# Patient Record
Sex: Female | Born: 1960 | Race: Black or African American | Hispanic: No | Marital: Married | Smoking: Never smoker
Health system: Southern US, Community
[De-identification: ages and names within clinical notes are randomized; demographics above are authoritative.]

## PROBLEM LIST (undated history)

## (undated) DIAGNOSIS — F319 Bipolar disorder, unspecified: Secondary | ICD-10-CM

## (undated) DIAGNOSIS — I10 Essential (primary) hypertension: Secondary | ICD-10-CM

## (undated) DIAGNOSIS — Z1231 Encounter for screening mammogram for malignant neoplasm of breast: Principal | ICD-10-CM

---

## 2015-08-19 NOTE — Nursing Note (Signed)
Nursing Discharge Summary - Text       Nursing Discharge Summary Entered On:  08/19/2015 21:00 EST    Performed On:  08/19/2015 20:58 EST by Homero Fellers, RN, AMANDA L               DC Information   Discharge To, Anticipated :   Home with family support   Professional Skilled Services, Anticipated :   No Needs   Mode of Discharge :   Ambulatory   Transportation :   Private vehicle   Accompanied By :   Gilford Silvius, RN, AMANDA L - 08/19/2015 20:58 EST   Education   Responsible Learner(s) :   No Data Available     Barriers To Learning :   None evident   Teaching Method :   Explanation, Printed materials   [place], RN, [place] L - 08/19/2015 20:58 EST   Post-Hospital Education Adult Grid   Diagnostic Results :   Verbalizes understanding   Disease Process :   Verbalizes understanding   Importance of Follow-Up Visits :   Verbalizes understanding   Pain Management :   Verbalizes understanding   When to Call Health Care Provider :   [facility] understanding   [place], RN, Jill Side - 08/19/2015 20:58 EST   Health Maintenance Education Adult Grid   Bathing/Hygiene :   Verbalizes understanding   [place], Charity fundraiser, Jill Side - 08/19/2015 20:58 EST   Medication Education Adult Grid   Med Dosage, Route, Scheduling :   TEFL teacher understanding   Med Generic/Brand Name, Purpose, Action :   Verbalizes understanding   Medication Precautions :   IT sales professional, Medication :   Verbalizes understanding   Homero Fellers, RN, AMANDA L - 08/19/2015 20:58 EST   Additional Learner(s) Present :   Spouse   Homero Fellers, RN, AMANDA L - 08/19/2015 20:58 EST

## 2015-08-20 NOTE — Nursing Note (Signed)
Nursing Discharge Summary - Text       Nursing Discharge Summary Entered On:  08/20/2015 23:11 EST    Performed On:  08/20/2015 23:10 EST by Adelene AmasALBENESIUS, RN, KRISTIN               DC Information   Mode of Discharge :   Wheelchair   Transportation :   Private vehicle   Accompanied By :   Evlyn CourierSpouse   ALBENESIUS, RN, KRISTIN - 08/20/2015 23:10 EST

## 2015-10-22 NOTE — Nursing Note (Signed)
Orthostatics - Text       Orthostatics Entered On:  10/22/2015 16:58 EST    Performed On:  10/22/2015 16:57 EST by Ladona Ridgel, RN, JENNIFER S               Orthostatics   Systolic/Diastolic  Supine BP :   140 mmHg   Systolic/Diastolic  Supine BP :   80 mmHg   Pulse Supine :   93 bpm   Systolic/Diastolic  Standing BP :   135 mmHg   Systolic/Diastolic  Standing BP :   86 mmHg   Pulse Standing :   98 bpm   Laruth Bouchard - 10/22/2015 16:57 EST

## 2016-04-30 NOTE — Nursing Note (Signed)
Adult Patient History Form-Text       Adult Patient History Entered On:  04/30/2016 21:30 EDT    Performed On:  04/30/2016 21:29 EDT by [facility], RN, SARAH               General Info   Preferred Name :   Lindsay Blackburn   Accompanied By :   Spouse   In Charge of News (ICON) Name :   [phone]   Primary Language :   English   Pregnancy Status :   Patient denies   [facility], [facility] - 04/30/2016 21:29 EDT   Problem History   (As Of: 04/30/2016 21:30:52 EDT)   Problems(Active)    Bipolar disorder (IMO  :[id number] )  Name of Problem:   Bipolar disorder ; Recorder:   TAYLOR, RN, JENNIFER S; Confirmation:   Confirmed ; Classification:   Medical ; Code:   39201 ; Contributor System:   DietitianowerChart ; Last Updated:   10/22/2015 16:28 EST ; Life Cycle Date:   10/22/2015 ; Life Cycle Status:   Active ; Vocabulary:   IMO        Diabetes (SNOMED CT  :[id number] )  Name of Problem:   Diabetes ; Recorder:   Delora FuelLOWDERMILK, RN, STEPHEN; Confirmation:   Confirmed ; Classification:   Medical ; Code:   [id number]:   [id number] ; Contributor System:   DietitianowerChart ; Last Updated:   08/19/2015 17:45 EST ; Life Cycle Date:   08/19/2015 ; Life Cycle Status:   Active ; Vocabulary:   SNOMED CT        Hypercholesteremia (IMO  :[id number] )  Name of Problem:   Hypercholesteremia ; Recorder:   Delora FuelLOWDERMILK, RN, STEPHEN; Confirmation:   Confirmed ; Classification:   Medical ; Code:   [phone] ; Contributor System:   PowerChart ; Last Updated:   08/19/2015 17:45 EST ; Life Cycle Date:   08/19/2015 ; Life Cycle Status:   Active ; Vocabulary:   IMO        Hypertension (SNOMED CT  :[id number] )  Name of Problem:   Hypertension ; Recorder:   Delora FuelLOWDERMILK, RN, STEPHEN; Confirmation:   Confirmed ; Classification:   Medical ; Code:   [id number] ; Contributor System:   PowerChart ; Last Updated:   08/19/2015 17:45 EST ; Life Cycle Date:   08/19/2015 ; Life Cycle Status:   Active ; Vocabulary:   SNOMED CT          Diagnoses(Active)    Benign  essential hypertension  Date:   04/30/2016 ; Diagnosis Type:   Discharge ; Confirmation:   Confirmed ; Clinical Dx:   Benign essential hypertension ; Classification:   Medical ; Clinical Service:   Non-Specified ; Code:   ICD-10-CM ; Probability:   0 ; Diagnosis Code:   I10      Chronic bipolar disorder  Date:   04/30/2016 ; Diagnosis Type:   Discharge ; Confirmation:   Confirmed ; Clinical Dx:   Chronic bipolar disorder ; Classification:   Medical ; Clinical Service:   Non-Specified ; Code:   ICD-10-CM ; Probability:   0 ; Diagnosis Code:   F31.9      CVA (cerebrovascular accident)  Date:   04/30/2016 ; Diagnosis Type:   Discharge ; Confirmation:   Confirmed ; Clinical Dx:   CVA (cerebrovascular accident) ; Classification:   Medical ; Clinical Service:   Non-Specified ; Code:   ICD-10-CM ; Probability:   0 ; Diagnosis Code:   I63.9  Diabetes  Date:   04/30/2016 ; Diagnosis Type:   Discharge ; Confirmation:   Confirmed ; Clinical Dx:   Diabetes ; Classification:   Medical ; Clinical Service:   Non-Specified ; Code:   ICD-10-CM ; Probability:   0 ; Diagnosis Code:   E11.9      Facial droop  Date:   04/30/2016 ; Diagnosis Type:   Reason For Visit ; Confirmation:   Confirmed ; Clinical Dx:   Facial droop ; Classification:   Medical ; Clinical Service:   Emergency medicine ; Code:   PNED ; Probability:   0 ; Diagnosis Code:   [phone]      Headache  Date:   04/30/2016 ; Diagnosis Type:   Reason For Visit ; Confirmation:   Complaint of ; Clinical Dx:   Headache ; Classification:   Medical ; Clinical Service:   Non-Specified ; Code:   PNED ; Probability:   0 ; Diagnosis Code:   [id number]      Hypercholesterolemia  Date:   04/30/2016 ; Diagnosis Type:   Discharge ; Confirmation:   Confirmed ; Clinical Dx:   Hypercholesterolemia ; Classification:   Medical ; Clinical Service:   Non-Specified ; Code:   ICD-10-CM ; Probability:   0 ; Diagnosis Code:   E78.00        Family  History   Family History   (As Of: 04/30/2016 21:30:52 EDT)     Allergy   (As Of: 04/30/2016 21:30:52 EDT)   Allergies (Active)   No Known Allergies  Estimated Onset Date:   Unspecified ; Created By:   Delora Fuel, RN, STEPHEN; Reaction Status:   Active ; Category:   Drug ; Substance:   No Known Allergies ; Type:   Allergy ; Updated By:   Lacy Duverney; Reviewed Date:   04/30/2016 21:30 EDT        Immunizations   Immunizations Current :   Yes   Last Tetanus :   Less than 5 years   [facility], RN, [facility] - 04/30/2016 21:29 EDT   ID Risk Screen   Patient Recent Travel History :   No recent travel   [facility], RN, [facility] - 04/30/2016 21:29 EDT   Chills :   No   Cough (Any Duration) :   No   Fever :   No   Hemoptysis (Blood in Sputum) :   No   Night Sweats :   No   RHODENBAUGH, RN, SARAH - 04/30/2016 21:29 EDT   C. diff Screen   Have you had 3 or more loose/watery stool in 24 hours? :   No   RHODENBAUGH, RN, [facility] - 04/30/2016 21:29 EDT   Procedure History        -    Procedure History   (As Of: 04/30/2016 21:30:52 EDT)     Anesthesia Minutes:   0 ; Procedure Name:   Tubal ligation ; Procedure Minutes:   0 ; Last Reviewed Dt/Tm:   04/30/2016 21:30:23 EDT            Anesthesia/Sedation   Anesthesia History :   Prior general anesthesia   [facility], RN, [facility] - 04/30/2016 21:29 EDT   Transfusion/Bloodless Med   Transfusion History :   Unknown   Will Patient Accept Blood Transfusion and/or Blood Products :   Yes   [facility], RN, [facility] - 04/30/2016 21:29 EDT   Nutrition   Home Diet :   Regular   RHODENBAUGH, RN, [facility] - 04/30/2016 21:29  EDT   Living and Resources   Living Situation :   Home with family support   [facility], [facility] - 04/30/2016 21:29 EDT   Social History   Social History   (As Of: 04/30/2016 21:30:52 EDT)   Tobacco:        Never smoker   (Last Updated: 04/30/2016 19:09:28 EDT by Harlin Heysollins, RN, Kishma A)          Alcohol:        Denies   (Last Updated: 10/22/2015 16:31:16 EST by Ladona RidgelAYLOR, RN, Richarda OsmondJENNIFER S)           Substance Abuse:        Denies   (Last Updated: 10/22/2015 16:31:20 EST by Ladona RidgelAYLOR, RN, Richarda OsmondJENNIFER S)            Advance Directive   *Advance Directive :   No   [facility]BAUGH, RN, [facility] - 04/30/2016 21:29 EDT   Educ Needs   Barriers to Learning :   Cognitive deficits   [facility] - 04/30/2016 21:29 EDT   DC Needs   Discharge To :   Home with family support   [facility], [facility] - 04/30/2016 21:29 EDT

## 2016-04-30 NOTE — Nursing Note (Signed)
Basic Admission Information - Text       Basic Admission Information Adult Entered On:  04/30/2016 21:29 EDT    Performed On:  04/30/2016 21:29 EDT by [facility], RN, SARAH               Height/Weight   Weight Measured :   97.1 kg(Converted to: 214 lb 1 oz, 214.069 lb)    Height/Length Measured :   165 cm(Converted to: 5 ft 5 in, 5.41 ft, 64.96 in)    BSA Measured :   2.04 m2   Body Mass Index Measured :   35.67 kg/m2   Ideal Body Weight Calculated :   56.909 kg   RHODENBAUGH, RN, SARAH - 04/30/2016 21:29 EDT   Safety   Environmental Safety in Place :   Adequate room lighting, Attend patient with toileting, Bed exit alarm, Bed in low position, Call device within reach   [facility] - 04/30/2016 21:29 EDT

## 2016-05-01 NOTE — Consults (Signed)
Consultation    [facility]  Derya Dettmann L. Katlin Bortner, MD  Service Date: 05/01/2016    REQUESTING PHYSICIAN:  Dr. 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   Parkinsonian features include prominent facial masking, sialorrhea,  and bradykinesia with cogwheeling.  There is no evidence of stroke by  exam or MRI brain.    RECOMMENDATIONS:  Psychiatric followup for adjustment of haloperidol  dose.  Reduced dose is recommended from a neurologic perspective.   Impression as above is discussed in detail with the patient and with  the husband at the bedside.    HISTORY OF PRESENT ILLNESS:  The patient is a 55 year old right-handed  female admitted on 04/30/2016.  History is obtained from conversation  with the patient and the husband at the bedside as well as medical  record review.  She complained of sialorrhea of 3 days duration prior  to admission.  She also noted a sense of right hand weakness and left  leg weakness/clumsiness.  She also complained of a mild to moderate  generalized headache.    She has had no recent nausea/vomiting.  She denies chest pain or  palpitations.  There is no prior history of stroke, seizures, or  cephalic trauma.    CT brain on admission concerning for possible subacute right corona  radiata lacunar infarction, but MRI brain performed earlier today  demonstrates no evidence of acute stroke or hemorrhage.  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    Presently, the patient reports that she is feeling better.  She has  not experienced drooling today.  She reports that her upper  extremities seem to be working better.  She has not been ambulating.    The patient with history of bipolar disorder followed at a local  mental health clinic.  She does not know the name of her psychiatrist.    PAST MEDICAL HISTORY:  Hypertension, diabetes mellitus,  hypercholesterolemia, bipolar disorder.    PAST SURGERIES:  Tubal ligation.    SOCIAL HISTORY:  The patient is married.  No  cigarette or alcohol use.    FAMILY HISTORY:  No family history of neurologic disease aside from a  brother with seizures.    SYSTEMS REVIEW:  Noncontributory except as noted above.    PHYSICAL EXAMINATION:  Temperature 36.4, blood pressure 135/91.  Pulse  80.  The patient is alert, calm, cooperative, and anxious.  Head is  atraumatic.  Neck supple.  Speech normally articulated with intact  comprehension.  Pupils equal and reactive.  Motility full and  conjugate without nystagmus.  Facial movements symmetric.  Prominent  facial masking present.  No sialorrhea presently.  Motor exam  demonstrates moderate bradykinesia, no tremor.  Fine motor movements  slowed of diminished amplitude, prominent cogwheeling both upper  extremities.  The patient moves all extremities actively and  symmetrically to command.  There is no drift, myoclonus, or asterixis.   Ambulation not observed.  Reflexes absent.  Plantar responses are  neutral.    DIAGNOSTIC TESTING:  MRI brain and CT brain as noted above.  Lab  studies reviewed.    Preadmission medications reviewed and include Haldol 10 mg daily,  Depakote 500 mg b.i.d., Benztropine 1 mg b.i.d.    Thank you very much for this consultation.      Karin LieuJames L Flower Franko, MD  TR: *n DD: 05/01/2016 17:33 TD: 05/01/2016 17:56 Job#: [id number]  \\[id number]\\DOC#: [id number]  \\[id number]\\\\[id number]\\      cc:  Arsenio LoaderWilliam Snyder III,  MD  Signature Line    Electronically Signed on 05/01/2016 10:37 PM EDT  ________________________________________________  Jesse SansBUMGARTNER-MD,  Lexander Tremblay L

## 2016-05-01 NOTE — Case Communication (Signed)
CM Discharge Planning Assessment - Text       CM Discharge Planning Ongoing Assessment Entered On:  05/01/2016 17:06 EDT    Performed On:  05/01/2016 16:59 EDT by Adela PortsHOLLADAY,  JANELLE L               Discharge Needs I   Previously Documented Discharge Needs :   DISCHARGE PLAN/NEEDS:  Discharge To, Anticipated: [facility] with family support [facility] 04/30/16 21:29:00  EQUIPMENT/TREATMENT NEEDS:       Previously Documented Benefits Information :   No discharge data available.     Anticipated Discharge Date :   05/02/2016 EDT   Anticipated Discharge Time Slot :   1400-1600   Discharge To :   Home with family support   Home Caregiver Name/Relationship :   Johann Capersjimmy Wolters-- spouse   Home Caregiver Phone Number :   [phone]  [phone]   CM Progress Note :   [facility]01/16[facility] patient seen at bedside.  she has returned to baseline neuro status , but is sp old cva.  she states hat she lives with her family in a one story home.  she is requesting a walker for assistance in ambulation and feels that she needs home health pt.  she states she has no dme at present .  her pcp is dr Marcy Sirencharles smith  and [facility]  she has medicare for [employer] reimbursement only.    will follow for pt eval recommendations,  but she wants to go home with home health and dme.  will follow.      Drucie OpitzHOLLADAY,  JANELLE L - 05/01/2016 16:59 EDT   Discharge Needs II   Needs Assistance with Transportation :   No   Needs Assistance at Home Upon Discharge :   Yes   Drucie OpitzHOLLADAY,  JANELLE L - 05/01/2016 16:59 EDT

## 2016-05-01 NOTE — Discharge Summary (Signed)
Discharge Summary    [facility]  Alexza Norbeck L. Katrinka BlazingSmith, MD  Admission Date: 04/30/2016  Discharged Date: 05/01/2016    DISCHARGE DIAGNOSES:  1.  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  2.  Possible transient ischemic attack.  3.  Bipolar disorder.  4.  Diabetes.  5.  Hypertension.    HOSPITAL COURSE:  Ms. Lindsay Blackburn was admitted with facial droop and  drooling that had been going on for several days prior to admission.   There is possibility of a stroke or at least TIA.  CT of the brain  initially in the Emergency Room showed possible lacunar infarct in the  corona radiata.  She was monitored overnight.  MRI of the brain did  not show evidence of that.  She was seen in consultation by Dr.  Collier BullockBumgartner who felt that she was having a parkinsonian reaction to her  haloperidol and recommended a decreased dose which was acted upon.   She was improved at the time of discharge.  Stable for followup with  me in the office in 1 week.      Orlando Pennerharles L Kyuss Hale, MD  TR: *n DD: 05/09/2016 16:05 TD: 05/10/2016 06:46 Job#: [id number]  \\[id number]\\DOC#: [id number]  \\[id number]\\\\[id number]\\  Signature Line    Electronically Signed on 05/10/2016 06:02 PM EDT  ________________________________________________  Katrinka BlazingSMITH JR-MD,  Revonda HumphreyHARLES LEON

## 2016-05-01 NOTE — Discharge Summary (Signed)
 Inpatient Patient Summary               [facility]  [address]  [place], [place] [address]  [phone]  Patient Discharge Instructions    Name: Lindsay Blackburn, Lindsay Blackburn  Current Date: 05/01/2016 17:48:39  DOB: Aug 19, 1961 MRN: [id number] FIN: [phone]  Patient Address: [address] [place] [address]  Patient Phone: [phone]  Primary Care Provider:  Name: CLAUDENE ROYCE CARLIN MERIBETH  Phone: [phone]   Immunizations Provided:        Immunization(s) Given This Visit   Name Date   tuberculin purified protein derivative 04/30/16 23:54:00          Discharge Diagnosis: Benign essential hypertension; CVA (cerebrovascular accident); Chronic bipolar disorder; Diabetes; Hypercholesterolemia  Discharged To: TO, ANTICIPATED%>Home with family support  Home Treatments: TREATMENTS, ANTICIPATED%>  Devices/Equipment: EQUIPMENT REHAB%>  Post Hospital Services: HOSPITAL SERVICES%>  Professional Skilled Services: SKILLED SERVICES%>  Therapist, sports and Community Resources: SERV AND COMM RES, ANTICIPATED%>  Mode of Discharge Transportation: TRANSPORTATION%>  Discharge Orders         Discharge Patient 05/01/16 17:30:00 EDT        Comment:     Medications   During the course of your visit, your medication list was updated with the most current information. The details of those changes are reflected below:         New Medications  [facility]), [address] [place], [place] [address], [phone]  aspirin (aspirin 81 mg oral tablet) 1 Tabs Oral (given by mouth) every day. Refills: 0.  Last Dose:____________________  aspirin (aspirin 81 mg oral tablet) 1 Tabs Oral (given by mouth) every day. Refills: 4.  Last Dose:____________________  Medications That Have Not Changed  Other Medications  amLODIPine (amLODIPine 10 mg oral tablet) 1 Tabs Oral (given by mouth) every day.  Last Dose:____________________  atorvastatin (atorvastatin 10 mg oral tablet) 1 Tabs Oral (given by mouth) every day.  Last  Dose:____________________  benztropine (benztropine 1 mg oral tablet) 1 Tabs Oral (given by mouth) 2 times a day.  Last Dose:____________________  divalproex sodium (divalproex sodium 500 mg oral tablet, extended release) 2 Tabs Oral (given by mouth) Once a Day (at bedtime).  Last Dose:____________________  famotidine (famotidine 20 mg oral tablet) 1 Tabs Oral (given by mouth) 2 times a day.  Last Dose:____________________  haloperidol (haloperidol 10 mg oral tablet) 1 Tabs Oral (given by mouth) Once a Day (at bedtime).  Last Dose:____________________  labetalol (labetalol 200 mg oral tablet) 1 Tabs Oral (given by mouth) 2 times a day.  Last Dose:____________________  metFORMIN (metFORMIN 500 mg oral tablet) 1 Tabs Oral (given by mouth) 2 times a day.  Last Dose:____________________  mupirocin topical (mupirocin 2% topical ointment) 1 Application Topical (on the skin) 3 times a day.  Last Dose:____________________  pioglitazone (pioglitazone 30 mg oral tablet) 1 Tabs Oral (given by mouth) every day.  Last Dose:____________________        [facility] would like to thank you for allowing us  to assist you with your healthcare needs. The following includes patient education materials and information regarding your injury/illness.    Schonberg, Isola L has been given the following list of follow-up instructions, prescriptions, and patient education materials:  Follow-up Instructions:             With: Address: When:   CLAUDENE ROYCE CARLIN MERIBETH [address]  [place], [place]  [address]  [phone]  In 1 week                 It is important to always keep an active list of medications available so that you can share with other providers and manage your medications appropriately. As an additional courtesy, we are also providing you with your final active medications list that you can keep with you.          amLODIPine (amLODIPine 10 mg oral tablet) 1 Tabs Oral (given by mouth) every day.  aspirin (aspirin 81 mg  oral tablet) 1 Tabs Oral (given by mouth) every day. Refills: 0.  aspirin (aspirin 81 mg oral tablet) 1 Tabs Oral (given by mouth) every day. Refills: 4.  atorvastatin (atorvastatin 10 mg oral tablet) 1 Tabs Oral (given by mouth) every day.  benztropine (benztropine 1 mg oral tablet) 1 Tabs Oral (given by mouth) 2 times a day.  divalproex sodium (divalproex sodium 500 mg oral tablet, extended release) 2 Tabs Oral (given by mouth) Once a Day (at bedtime).  famotidine (famotidine 20 mg oral tablet) 1 Tabs Oral (given by mouth) 2 times a day.  haloperidol (haloperidol 10 mg oral tablet) 1 Tabs Oral (given by mouth) Once a Day (at bedtime).  labetalol (labetalol 200 mg oral tablet) 1 Tabs Oral (given by mouth) 2 times a day.  metFORMIN (metFORMIN 500 mg oral tablet) 1 Tabs Oral (given by mouth) 2 times a day.  mupirocin topical (mupirocin 2% topical ointment) 1 Application Topical (on the skin) 3 times a day.  pioglitazone (pioglitazone 30 mg oral tablet) 1 Tabs Oral (given by mouth) every day.      Take only the medications listed above. Contact your doctor prior to taking any medications not on this list.      Discharge instructions, if any, will display below    Instructions for Diet: INSTRUCTIONS FOR DIET%>   Instructions for Supplements: SUPPLEMENT INSTRUCTIONS%>   Instructions for Activity: INSTRUCTIONS FOR ACTIVITY%>   Instructions for Wound Care: INSTRUCTIONS FOR WOUND CARE%>    Medication leaflets, if any, will display below     Patient education materials, if any, will display below        Discharge Instructions for Transient Ischemic Attack (TIA)   You have been diagnosed with a transient ischemic attack (TIA). You can think of a TIA as a temporary or mini-stroke. Blood temporarily could not reach part of your brain. Unlike a stroke, TIAs usually cause no lasting damage. If you think you are having symptoms of a TIA or stroke, get medical help right away--even if the symptoms go away.    Prevention    Take  your medications exactly as directed. Dont skip doses.    Learn to take your blood pressure. Keep a log for your doctor.    Change your diet if your doctor tells you to. Your doctor may suggest that you cut back on salt. If so, here are some tips:    Limit canned, dried, packaged, and fast foods.    Dont add salt to your food at the table.    Season foods with herbs instead of salt when you cook.    Maintain a healthy weight. Get help to lose any extra pounds.    Begin an exercise program. Ask your doctor how to get started. You can benefit from simple activities, such as walking or gardening.    Limit your alcohol intake to no more than 2 drinks a day.    Know your  cholesterol level. Follow your doctors advice about how to keep cholesterol under control.    If you are a smoker, you need to quit now. Enroll in a stop-smoking program to improve your chances of success. Ask your doctor about medications or other methods to help you quit.    Your health care provider will give you information on dietary changes that you may need to make, based on your situation. Your provider may recommend that you see a registered dietitian for help with diet changes. Changes may include:    Reducing fat and cholesterol intake    Reducing sodium (salt) intake, especially if you have high blood pressure    Increasing your intake of fresh vegetables and fruits    Eating lean proteins, such as fish, poultry, and legumes (beans and peas) and eating less red meat and processed meats    Using low-fat dairy products    Using vegetable and nut oils in limited amounts    Limiting sweets and processed foods such as chips, cookies, and baked goods    If you are overweight, your health care provider will work with you to lose weight and lower your body mass index (BMI) to a normal or near-normal level. Making diet changes and increasing physical activity can help.    Begin an exercise program. Ask your doctor how to get  started and how much activity you should try to get on a daily or weekly basis. You can benefit from simple activities such as walking or gardening.    Learn stress-management techniques to help you deal with stress in your home and work life.   Follow-up care    Some medications require blood tests to check for progress or problems. Keep follow-up appointments for any blood tests ordered by your doctors.       When to seek medical care   Call 911 right away if you have any of the following:    Weakness, tingling, or loss of feeling on one side of your face or body    Sudden double vision, or trouble seeing in one or both eyes    Sudden trouble talking, or slurring your speech    Trouble understanding others    Sudden, severe headache    Dizziness, loss of balance, or a spinning feeling, a sense of falling    Blackouts or seizures      2000-2017 [employer] [address] [place], [place] [address]. All rights reserved. This information is not intended as a substitute for professional medical care. Always follow your healthcare professional's instructions.         What is a TIA?   A TIA (Transient Ischemic Attack) is an early warning that a stroke (also called a brain attack) is coming. A TIA is a temporary stroke. It causes no lasting damage. But the effects of a stroke, if it happens, can be very serious and lasting. If you think you are having symptoms of a TIA or stroke--even if they dont last--get medical help right away.       If you have any symptoms of a TIA or stroke, call 911 and your doctor as soon as possible.    Symptoms of TIA and stroke   Symptoms may come on suddenly and last for a few seconds or a few hours. You may have symptoms only once. Or they may come and go for days. If you notice any of the following symptoms, dont wait. Call 911 or  emergency services right away.    Weakness, numbness, tingling, or loss of feeling in your face, arm, or leg.    Trouble seeing in one  or both eyes; double vision.    Slurred speech, trouble talking, or problems understanding others when they speak    Sudden, severe headache    Dizziness or a feeling of spinning    Loss of balance or falling    Blackouts      2000-2017 [employer] [address] [place], [place] [address]. All rights reserved. This information is not intended as a substitute for professional medical care. Always follow your healthcare professional's instructions.           IS IT A STROKE? Act FAST and Check for these signs:    FACE                         Does the face look uneven?    ARM                         Does one arm drift down?    SPEECH                    Does their speech sound strange?    TIME                         Call 9-1-1 at any sign of stroke  Heart Attack Signs  Chest discomfort: Most heart attacks involve discomfort in the center of the chest and lasts more than a few minutes, or goes away and comes back. It can feel like uncomfortable pressure, squeezing, fullness or pain.  Discomfort in upper body: Symptoms can include pain or discomfort in one or both arms, back, neck, jaw or stomach.  Shortness of breath: With or without discomfort.  Other signs: Breaking out in a cold sweat, nausea, or lightheaded.  Remember, MINUTES DO MATTER. If you experience any of these heart attack warning signs, call 9-1-1 to get immediate medical attention!     ---------------------------------------------------------------------------------------------------------------------  [facility] allows you to manage your health, view your test results, and retrieve your discharge documents from your hospital stay securely and conveniently from your computer.  To begin the enrollment process, visit [email or web address] Click on "Sign up now" under [facility].   Yes - Patient/Family/Caregiver demonstrates understanding of instructions given        ______________________________                                  ___________________    Patient/Family/ Caregiver Signature                                                      Date/Time     ______________________________                                 ___________________    Provider Signature  Date/Time

## 2016-05-01 NOTE — Discharge Summary (Signed)
 Inpatient Clinical Summary             [facility]  Post-Acute Care Transfer Instructions  PERSON INFORMATION   Name: Lindsay Blackburn, Lindsay Blackburn   MRN: [id number]    FIN#: [id number]   PHYSICIANS  Admitting Physician: NONIE FALLOW III  Attending Physician: NONIE FALLOW III   PCP: CLAUDENE ROYCE CARLIN MERIBETH  Discharge Diagnosis: Benign essential hypertension; CVA (cerebrovascular accident); Chronic bipolar disorder; Diabetes; Hypercholesterolemia  Comment:       PATIENT EDUCATION INFORMATION  Instructions:             Transient Ischemic Attack (TIA), Discharge Instructions; What Is a TIA?  Medication Leaflets:               Follow-up:                          With: Address: When:   CLAUDENE ROYCE CARLIN MERIBETH [address] [place]  [place], [place]  [address]  [phone] In 1 week                           MEDICATION LIST  Medication Reconciliation at Discharge:         New Medications  [facility]), [address] [place], [place] [address], [phone]  aspirin (aspirin 81 mg oral tablet) 1 Tabs Oral (given by mouth) every day. Refills: 0.  Last Dose:____________________  aspirin (aspirin 81 mg oral tablet) 1 Tabs Oral (given by mouth) every day. Refills: 4.  Last Dose:____________________  Medications That Have Not Changed  Other Medications  amLODIPine (amLODIPine 10 mg oral tablet) 1 Tabs Oral (given by mouth) every day.  Last Dose:____________________  atorvastatin (atorvastatin 10 mg oral tablet) 1 Tabs Oral (given by mouth) every day.  Last Dose:____________________  benztropine (benztropine 1 mg oral tablet) 1 Tabs Oral (given by mouth) 2 times a day.  Last Dose:____________________  divalproex sodium (divalproex sodium 500 mg oral tablet, extended release) 2 Tabs Oral (given by mouth) Once a Day (at bedtime).  Last Dose:____________________  famotidine (famotidine 20 mg oral tablet) 1 Tabs Oral (given by mouth) 2 times a day.  Last Dose:____________________  haloperidol  (haloperidol 10 mg oral tablet) 1 Tabs Oral (given by mouth) Once a Day (at bedtime).  Last Dose:____________________  labetalol (labetalol 200 mg oral tablet) 1 Tabs Oral (given by mouth) 2 times a day.  Last Dose:____________________  metFORMIN (metFORMIN 500 mg oral tablet) 1 Tabs Oral (given by mouth) 2 times a day.  Last Dose:____________________  mupirocin topical (mupirocin 2% topical ointment) 1 Application Topical (on the skin) 3 times a day.  Last Dose:____________________  pioglitazone (pioglitazone 30 mg oral tablet) 1 Tabs Oral (given by mouth) every day.  Last Dose:____________________         Patient's Final Home Medication List Upon Discharge:          amLODIPine (amLODIPine 10 mg oral tablet) 1 Tabs Oral (given by mouth) every day.  aspirin (aspirin 81 mg oral tablet) 1 Tabs Oral (given by mouth) every day. Refills: 0.  aspirin (aspirin 81 mg oral tablet) 1 Tabs Oral (given by mouth) every day. Refills: 4.  atorvastatin (atorvastatin 10 mg oral tablet) 1 Tabs Oral (given by mouth) every day.  benztropine (benztropine 1 mg oral tablet) 1 Tabs Oral (given by mouth) 2 times a day.  divalproex sodium (divalproex  sodium 500 mg oral tablet, extended release) 2 Tabs Oral (given by mouth) Once a Day (at bedtime).  famotidine (famotidine 20 mg oral tablet) 1 Tabs Oral (given by mouth) 2 times a day.  haloperidol (haloperidol 10 mg oral tablet) 1 Tabs Oral (given by mouth) Once a Day (at bedtime).  labetalol (labetalol 200 mg oral tablet) 1 Tabs Oral (given by mouth) 2 times a day.  metFORMIN (metFORMIN 500 mg oral tablet) 1 Tabs Oral (given by mouth) 2 times a day.  mupirocin topical (mupirocin 2% topical ointment) 1 Application Topical (on the skin) 3 times a day.  pioglitazone (pioglitazone 30 mg oral tablet) 1 Tabs Oral (given by mouth) every day.         Comment:       ORDERS         Order Name Order Details   Discharge Patient 05/01/16 17:30:00 EDT

## 2016-09-12 NOTE — Nursing Note (Signed)
Basic Admission Information - Text       Basic Admission Information Adult Entered On:  09/12/2016 2:05 EST    Performed On:  09/12/2016 2:03 EST by Wilford SportsBOULWARE, RN, SHARNICE R               Height/Weight   Weight Measured :   102.05 kg(Converted to: 225 lb 0 oz, 224.982 lb)    Height/Length Measured :   167 cm(Converted to: 5 ft 6 in, 5.48 ft, 65.75 in)    BSA Measured :   2.1 m2   Body Mass Index Measured :   36.59 kg/m2   Ideal Body Weight Calculated :   58.72 kg   BOULWARE, RN, SHARNICE R - 09/12/2016 2:03 EST   Allergies   (As Of: 09/12/2016 02:05:54 EST)   Allergies (Active)   No Known Allergies  Estimated Onset Date:   Unspecified ; Created By:   Delora FuelLOWDERMILK, RN, STEPHEN; Reaction Status:   Active ; Category:   Drug ; Substance:   No Known Allergies ; Type:   Allergy ; Updated By:   Lacy DuverneyLOWDERMILK, RN, STEPHEN; Reviewed Date:   09/11/2016 23:41 EST        Medication History   Medication List   (As Of: 09/12/2016 02:05:54 EST)   Normal Order    influenza virus vaccine, inactivated mdck cell derived quadrivalent Sus  :   influenza virus vaccine, inactivated mdck cell derived quadrivalent Sus ; Status:   Ordered ; Ordered As Mnemonic:   influenza virus vaccine, inactivated ; Simple Display Line:   0.5 mL, IM, Once ; Ordering Provider:   Thurston PoundsHICKMAN-MD,  CARY S; Catalog Code:   influenza virus vaccine, inactivated ; Order Dt/Tm:   09/12/2016 00:56:55          New Consult - Check Clinical Pharmacy MPTL  :   New Consult - Check Clinical Pharmacy MPTL ; Status:   Discontinued ; Ordered As Mnemonic:   New Consult - Check Clinical Pharmacy MPTL ; Simple Display Line:   1 EA, N/A, On Call ; Ordering Provider:   SYSTEM,  SYSTEM; Catalog Code:   New Consult - Check Clinical Pharmacy MP ; Order Dt/Tm:   09/12/2016 00:54:49 ; Comment:   New consult for Flu Vaccine - Order Needed. Pharmacist should review in the Clinical Pharmacy MPTL.      As soon as appropriate, pharmacy should discontinue or void this [facility]           ondansetron 2 mg/mL Inj Soln 2 mL  :   ondansetron 2 mg/mL Inj Soln 2 mL ; Status:   Completed ; Ordered As Mnemonic:   Zofran ; Simple Display Line:   4 mg, 2 mL, IV Push, Once ; Ordering Provider:   MOE-MD,  CHRISTOPHER B; Catalog Code:   ondansetron ; Order Dt/Tm:   09/12/2016 00:01:32          aspirin 81 mg Chew Tab  :   aspirin 81 mg Chew Tab ; Status:   Completed ; Ordered As Mnemonic:   aspirin ; Simple Display Line:   324 mg, 4 tabs, Chewed, Once ; Ordering Provider:   MOE-MD,  CHRISTOPHER B; Catalog Code:   aspirin ; Order Dt/Tm:   09/11/2016 23:38:41          iopamidol 76% Inj Soln 100 mL  :   iopamidol 76% Inj Soln 100 mL ; Status:   Ordered ; Ordered As Mnemonic:   Isovue-370 ;  Simple Display Line:   100 mL, IV Contrast, On Call ; Ordering Provider:   MOE-MD,  CHRISTOPHER B; Catalog Code:   iopamidol ; Order Dt/Tm:   09/11/2016 23:38:32          iopamidol 76% Inj Soln 100 mL  :   iopamidol 76% Inj Soln 100 mL ; Status:   Completed ; Ordered As Mnemonic:   Isovue-370 ; Simple Display Line:   100 mL, IV Contrast, On Call ; Ordering Provider:   MOE-MD,  CHRISTOPHER B; Catalog Code:   iopamidol ; Order Dt/Tm:   09/11/2016 23:38:31          LORazepam 2 mg/mL Inj Soln 1 mL  :   LORazepam 2 mg/mL Inj Soln 1 mL ; Status:   Completed ; Ordered As Mnemonic:   Ativan ; Simple Display Line:   1 mg, 0.5 mL, IV Push, Once ; Ordering Provider:   MOE-MD,  CHRISTOPHER B; Catalog Code:   LORazepam ; Order Dt/Tm:   09/11/2016 23:38:40            Prescription/Discharge Order    aspirin  :   aspirin ; Status:   Prescribed ; Ordered As Mnemonic:   aspirin 81 mg oral tablet ; Simple Display Line:   81 mg, 1 tabs, Oral, Daily, 90 tabs, 4 Refill(s) ; Ordering Provider:   Lahoma Rocker,  Revonda Humphrey; Catalog Code:   aspirin ; Order Dt/Tm:   05/01/2016 17:26:05          aspirin  :   aspirin ; Status:   Prescribed ; Ordered As Mnemonic:   aspirin 81 mg oral tablet ; Simple Display Line:   81 mg, 1 tabs, Oral, Daily, 90 tabs, 0 Refill(s) ;  Ordering Provider:   Lahoma Rocker,  Revonda Humphrey; Catalog Code:   aspirin ; Order Dt/Tm:   05/01/2016 17:25:52            Home Meds    amLODIPine  :   amLODIPine ; Status:   Documented ; Ordered As Mnemonic:   amLODIPine 10 mg oral tablet ; Simple Display Line:   10 mg, 1 tabs, Oral, Daily, 90 tabs, 0 Refill(s) ; Catalog Code:   amLODIPine ; Order Dt/Tm:   05/01/2016 12:35:28          divalproex sodium  :   divalproex sodium ; Status:   Documented ; Ordered As Mnemonic:   divalproex sodium 500 mg oral tablet, extended release ; Simple Display Line:   1,000 mg, 2 tabs, Oral, Once a Day (at bedtime), 0 Refill(s) ; Catalog Code:   divalproex sodium ; Order Dt/Tm:   05/01/2016 12:35:28          famotidine  :   famotidine ; Status:   Documented ; Ordered As Mnemonic:   famotidine 20 mg oral tablet ; Simple Display Line:   20 mg, 1 tabs, Oral, BID, 0 Refill(s) ; Catalog Code:   famotidine ; Order Dt/Tm:   05/01/2016 12:33:13          haloperidol  :   haloperidol ; Status:   Documented ; Ordered As Mnemonic:   haloperidol 10 mg oral tablet ; Simple Display Line:   10 mg, 1 tabs, Oral, Once a Day (at bedtime), 0 Refill(s) ; Catalog Code:   haloperidol ; Order Dt/Tm:   05/01/2016 12:35:28          metFORMIN  :   metFORMIN ; Status:   Documented ; Ordered As Mnemonic:  metFORMIN 500 mg oral tablet ; Simple Display Line:   500 mg, 1 tabs, Oral, BID, 60 tabs, 0 Refill(s) ; Catalog Code:   metFORMIN ; Order Dt/Tm:   05/01/2016 12:27:21          mupirocin topical  :   mupirocin topical ; Status:   Documented ; Ordered As Mnemonic:   mupirocin 2% topical ointment ; Simple Display Line:   1 app, Topical, TID, 22 g, 0 Refill(s) ; Ordering Provider:   Alita Chyle; Catalog Code:   mupirocin topical ; Order Dt/Tm:   05/01/2016 12:35:28          atorvastatin  :   atorvastatin ; Status:   Documented ; Ordered As Mnemonic:   atorvastatin 10 mg oral tablet ; Simple Display Line:   10 mg, 1 tabs, Oral, Daily, 0 Refill(s) ; Catalog  Code:   atorvastatin ; Order Dt/Tm:   10/22/2015 16:30:58          benztropine  :   benztropine ; Status:   Documented ; Ordered As Mnemonic:   benztropine 1 mg oral tablet ; Simple Display Line:   1 mg, 1 tabs, Oral, BID, 180 tabs, 0 Refill(s) ; Ordering Provider:   Rogelio Seen; Catalog Code:   benztropine ; Order Dt/Tm:   10/22/2015 16:30:58          labetalol  :   labetalol ; Status:   Documented ; Ordered As Mnemonic:   labetalol 200 mg oral tablet ; Simple Display Line:   200 mg, 1 tabs, Oral, BID, 180 tabs, 0 Refill(s) ; Catalog Code:   labetalol ; Order Dt/Tm:   10/22/2015 16:30:58          pioglitazone  :   pioglitazone ; Status:   Documented ; Ordered As Mnemonic:   pioglitazone 30 mg oral tablet ; Simple Display Line:   30 mg, 1 tabs, Oral, Daily, 30 tabs, 0 Refill(s) ; Catalog Code:   pioglitazone ; Order Dt/Tm:   10/22/2015 16:30:58            Vital Signs   Temperature Oral :   36.6 degC   Peripheral Pulse Rate :   22 bpm (LOW)    Respiratory Rate :   22 br/min (HI)    Systolic/ Diastolic BP :   145 mmHg (HI)    Diastolic Blood Pressure :   75 mmHg   SpO2 :   100 %   O2 Therapy :   Room air   BOULWARE, RN, SHARNICE R - 09/12/2016 2:03 EST   Safety   Environmental Safety in Place :   Bed in low position, Wheels locked, Call device within reach, Personal items within reach, Bed exit alarm, Patient specific safety measures   Demonstrates Ability to Use Call Light :   Yes   Patient Identified :   Identification band, Photo identification   BOULWARE, RN, SHARNICE R - 09/12/2016 2:03 EST

## 2016-09-12 NOTE — Nursing Note (Signed)
 Adult Clinical Swallowing - Text       Adult Clinical Swallowing Entered On:  09/12/2016 9:17 EST    Performed On:  09/12/2016 9:16 EST by BARI, SLP, NANCY J               Reason for Treatment   *Reason for Referral :   stroke protocol evaluation     *Chief Complaint :   pt offers no specific complaints     Subjective Statement :   Pt was seen this am for evaluation .Pt was up and seated in recliner. Alert. Respirations appeared to be effortful but pt did not appear in distress. Unclear what prior communication status was. Pt with prior CVA per history. No new neuro event demonstrated on CT scan. She does have a history of bipolar disorder.     DURHAM, SLP, NANCY J - 09/12/2016 9:19 EST   General Information   Speech Orders :   SLP Eval and Treat Acute - 09/12/16 2:49:00 EST, Stop date 09/12/16 2:49:00 EST     Precautions RTF :    Notify Provider Laboratory Results, 09/12/16 2:54:00 EST, Call MD for BG greater than 350 x 2 or greater than 400 x 1 on any schedule, Ordered   Notify Provider, 09/12/16 2:49:00 EST, Failed nursing swallow  screen, 09/12/16 2:49:00 EST, 09/12/16 2:49:00 EST, Ordered   Notify Provider, 09/12/16 2:49:00 EST, Notify physician for any increased deficits., 09/12/16 2:49:00 EST, 09/12/16 2:49:00 EST, Ordered   Notify Provider Laboratory Results, 09/12/16 2:49:00 EST, Blood glucose less than 70 or greater than 200 mg/dL (x 2 consecutive measurements), Ordered   Notify Provider Vital Signs, 09/12/16 2:49:00 EST, SBP greater than 220 or less than 110, Ordered   Notify Provider Vital Signs, 09/12/16 2:49:00 EST, DBP greater than 120, Ordered   Notify Rapid Response Team, 09/12/16 2:49:00 EST, For concerns regarding patient condition & notify MD, 09/12/16 2:49:00 EST, 09/12/16 2:49:00 EST, Ordered   Change attending to, 09/12/16 0:33:00 EST, HICKMAN-MD,  CARY S, Ordered   ED Only Oxygen Therapy, 09/11/16 23:13:00 EST, Stop date 09/11/16 23:13:00 EST, Keep Sat > 92%, Completed   Notify Provider, 09/11/16  23:13:00 EST, Neurology/Telestroke Eval if Hot Stroke, 09/11/16 23:13:00 EST, 09/11/16 23:13:00 EST, Once, Completed     History of Swallow Studies :   No qualifying data available     Level of Consciousness :   Alert   Affect/Behavior :   Flat, Other: delayed verbal output   Basic Command Following :   Impaired   Orientation Assessment :   Other: OR to person, place and grossly to time with visual cues   DURHAM, SLP, NANCY J - 09/12/2016 9:19 EST   Problem List   (As Of: 09/12/2016 09:32:40 EST)   Problems(Active)    At risk for infection (SNOMED CT  :786229980 )  Name of Problem:   At risk for infection ; Recorder:   SYSTEM,  SYSTEM; Confirmation:   Confirmed ; Classification:   Interdisciplinary ; Code:   [id number] ; Last Updated:   05/01/2016 12:05 EDT ; Life Cycle Date:   05/01/2016 ; Life Cycle Status:   Active ; Vocabulary:   SNOMED CT   ; Comments:        05/01/2016 12:05 - SYSTEM,  SYSTEM  Problem added automatically by system based on initiation of Risk For Infection Plan of Care      At risk for injury (SNOMED CT  :786228984 )  Name of Problem:   At risk  for injury ; Recorder:   SYSTEM,  SYSTEM; Confirmation:   Confirmed ; Classification:   Interdisciplinary ; Code:   [id number] ; Last Updated:   05/01/2016 6:13 EDT ; Life Cycle Date:   05/01/2016 ; Life Cycle Status:   Active ; Vocabulary:   SNOMED CT   ; Comments:        05/01/2016 6:13 - SYSTEM,  SYSTEM  Problem added automatically by system based on initiation of Risk for Injury Plan of Care      At risk of pressure ulcer (SNOMED CT  :7182591985 )  Name of Problem:   At risk of pressure ulcer ; Recorder:   SYSTEM,  SYSTEM; Confirmation:   Confirmed ; Classification:   Nursing ; Code:   7182591985 ; Last Updated:   05/01/2016 12:05 EDT ; Life Cycle Date:   05/01/2016 ; Life Cycle Status:   Active ; Vocabulary:   SNOMED CT        Bipolar disorder (IMO  :60798 )  Name of Problem:   Bipolar disorder ; Recorder:   TAYLOR, RN, JENNIFER S; Confirmation:   Confirmed ;  Classification:   Medical ; Code:   [phone] ; Contributor System:   PowerChart ; Last Updated:   09/12/2016 9:05 EST ; Life Cycle Date:   09/12/2016 ; Life Cycle Status:   Active ; Vocabulary:   IMO        Diabetes (SNOMED CT  :[id number] )  Name of Problem:   Diabetes ; Recorder:   DOROTEO, RN, STEPHEN; Confirmation:   Confirmed ; Classification:   Medical ; Code:   [id number] ; Contributor System:   PowerChart ; Last Updated:   09/12/2016 9:05 EST ; Life Cycle Date:   09/12/2016 ; Life Cycle Status:   Active ; Vocabulary:   SNOMED CT        Hypercholesteremia (IMO  :53324 )  Name of Problem:   Hypercholesteremia ; Recorder:   DOROTEO, RN, STEPHEN; Confirmation:   Confirmed ; Classification:   Medical ; Code:   [phone] ; Contributor System:   PowerChart ; Last Updated:   08/19/2015 17:45 EST ; Life Cycle Date:   08/19/2015 ; Life Cycle Status:   Active ; Vocabulary:   IMO        Hypertension (SNOMED CT  :35823988 )  Name of Problem:   Hypertension ; Recorder:   DOROTEO, RN, STEPHEN; Confirmation:   Confirmed ; Classification:   Medical ; Code:   35823988 ; Contributor System:   PowerChart ; Last Updated:   09/12/2016 9:05 EST ; Life Cycle Date:   09/12/2016 ; Life Cycle Status:   Active ; Vocabulary:   SNOMED CT        Impaired tissue integrity (SNOMED CT  :18596984 )  Name of Problem:   Impaired tissue integrity ; Recorder:   SYSTEM,  SYSTEM; Confirmation:   Confirmed ; Classification:   Interdisciplinary ; Code:   18596984 ; Last Updated:   05/01/2016 12:05 EDT ; Life Cycle Date:   05/01/2016 ; Life Cycle Status:   Active ; Vocabulary:   SNOMED CT   ; Comments:        05/01/2016 12:05 - SYSTEM,  SYSTEM  Problem added automatically by system based on initiation of Impaired Tissue Integrity Plan of Care      Impaired verbal communication (SNOMED CT  :46534988 )  Name of Problem:   Impaired verbal communication ; Recorder:   SYSTEM,  SYSTEM; Confirmation:   Confirmed ;  Classification:   Interdisciplinary ; Code:   46534988 ; Last Updated:   05/01/2016 6:13 EDT ; Life Cycle Date:   05/01/2016 ; Life Cycle Status:   Active ; Vocabulary:   SNOMED CT   ; Comments:        05/01/2016 6:13 - SYSTEM,  SYSTEM  Problem added automatically by system based on initiation of Impaired Communication Plan of Care      Knowledge deficit (SNOMED CT  :8768626985 )  Name of Problem:   Knowledge deficit ; Recorder:   SYSTEM,  SYSTEM; Confirmation:   Confirmed ; Classification:   Interdisciplinary ; Code:   [id number] ; Last Updated:   05/01/2016 12:05 EDT ; Life Cycle Date:   05/01/2016 ; Life Cycle Status:   Active ; Vocabulary:   SNOMED CT   ; Comments:        05/01/2016 12:05 - SYSTEM,  SYSTEM  Problem added automatically by system based on initiation of Knowledge Deficit Plan of Care        Diagnoses(Active)    Anxiety  Date:   09/12/2016 ; Diagnosis Type:   Discharge ; Confirmation:   Confirmed ; Clinical Dx:   Anxiety ; Classification:   Medical ; Clinical Service:   Non-Specified ; Code:   ICD-10-CM ; Probability:   0 ; Diagnosis Code:   F41.9      Bipolar disorder  Date:   09/12/2016 ; Diagnosis Type:   Discharge ; Confirmation:   Confirmed ; Clinical Dx:   Bipolar disorder ; Classification:   Medical ; Code:   ICD-10-CM ; Probability:   0 ; Diagnosis Code:   F31.9      Diabetes  Date:   09/12/2016 ; Diagnosis Type:   Discharge ; Confirmation:   Confirmed ; Clinical Dx:   Diabetes ; Classification:   Medical ; Code:   ICD-10-CM ; Probability:   0 ; Diagnosis Code:   E13.9      Difficulty speaking  Date:   09/11/2016 ; Diagnosis Type:   Reason For Visit ; Confirmation:   Confirmed ; Clinical Dx:   Difficulty speaking ; Classification:   Medical ; Clinical Service:   Emergency medicine ; Code:   PNED ; Probability:   0 ; Diagnosis Code:   [id number]      Dyspnea  Date:   09/12/2016 ; Diagnosis Type:   Discharge ; Confirmation:   Confirmed ; Clinical Dx:   Dyspnea ; Classification:    Medical ; Code:   ICD-10-CM ; Probability:   0 ; Diagnosis Code:   R06.00      Hypertension  Date:   09/12/2016 ; Diagnosis Type:   Discharge ; Confirmation:   Confirmed ; Clinical Dx:   Hypertension ; Classification:   Medical ; Code:   ICD-10-CM ; Probability:   0 ; Diagnosis Code:   I10      Metabolic acidosis  Date:   09/12/2016 ; Diagnosis Type:   Discharge ; Confirmation:   Confirmed ; Clinical Dx:   Metabolic acidosis ; Classification:   Medical ; Code:   ICD-10-CM ; Probability:   0 ; Diagnosis Code:   E87.2      Transient ischemic attack (TIA)  Date:   09/12/2016 ; Diagnosis Type:   Discharge ; Confirmation:   Confirmed ; Clinical Dx:   Transient ischemic attack (TIA) ; Classification:   Medical ; Clinical Service:   Non-Specified ; Code:   ICD-10-CM ; Probability:   0 ; Diagnosis Code:  G45.9        Pre-Assessment   Diet Type :   Regular Diet - 09/12/16 2:49:00 EST, Cardiac  Consistent Carb, Na: 2 gm, Constant Indicator     Home Diet Consistency :   Regular   Current Method of Nutrition :   Oral intake   Tracheostomy Information :   No qualifying data available.       DURHAM, SLP, NANCY J - 09/12/2016 9:19 EST   Oral-Facial Exam   Facial Movement :   Within functional limits   Dentition :   Own teeth   DURHAM, SLP, NANCY J - 09/12/2016 9:19 EST   Oral Structure Grid   Labial Structure :   Within functional limits   Mandible :   Within functional limits   Lingual Structure :   Within functional limits   Hard Palate :   Within functional limits   Soft Palate :   Within functional limits   DURHAM, SLP, NANCY J - 09/12/2016 9:19 EST   Oral Function Grid   Labial :   Within functional limits   Mandible :   Within functional limits   Lingual :   Within functional limits   Velopharyngeal :   Within functional limits   DURHAM, SLP, NANCY J - 09/12/2016 9:19 EST   Volitional Cough :   Within functional limits   Secretion Management :   Adequate   DURHAM, SLP, NANCY J - 09/12/2016 9:19 EST   Upper Body Control Grid   Head Control  :   Upright centered position   Trunk Control :   Upright centered position   [place], SLP, NANCY J - 09/12/2016 9:19 EST   Swallowing Exam   SLP Swallowing Details :   Pt seen at bedside. Feeding self. Dyspnea while eating and while attempting to talk without eating. Did not appeared distressed by this. Pt with timely oral prep and transit with regular textures and thin liquids. Timely swallow and no overt s/s aspiration noted. Vocal quality clear throughout    Limited speech eval completed at this time as pt was preoccupied with eating. Pt able to answer basic y/n questions and was able to provide some biographical information when requested. However, as complexity of topic increased, pt demonstrated poor verbal organization and very slow response time. It is difficult to determine if this is altered from pts prior functioning level. Pt has history of a prior stroke and some atrophy on CT scan. Will attempt to determine what prior functional status was before hospitalization and further assess speech and language as indicated.     BARI, SLP, NANCY J - 09/12/2016 9:33 EST   Assessment/Recommendations   Clinical Swallowing Impression Grid   Within Functional Limits :   Liquids, Solids   DURHAM, SLP, NANCY J - 09/12/2016 9:33 EST   Diet Consistency :   Regular   Liquid Viscosity :   Thin   Medications :   Whole with drink   Positioning :   Upright 90 degrees   Clinical Swallowing Recommendations :   Other: ST to follow re: speech and language   DURHAM, SLP, NANCY J - 09/12/2016 9:33 EST   Long Term Goals   Swallow Goals Grid     SLP Long Term Goal #1          Goal :    Pt will improve expression of basic to mod level needs with min cue to 90% acc  DURHAM, SLP, NANCY J - September 29, 2016 9:33 EST         Short Term Goals   Expressive Communication STG Grid     Goal #1          Activity :    Completeness, Conversation, Topic initiation, Topic maintenance              Context :    Sentence level              Percentage  Accuracy :    80 %              Assist :    Minimal to moderate verbal cues              Status :    Initial                DURHAM, SLP, NANCY J - 29-Sep-2016 9:33 EST         Time Spent With Patient   SLP Time In :   9:00 EST   SLP Time Out :   9:15 EST   SLP Dysphagia Evaluation :   15 Minutes   SLP Dysphagia Eval Time :   15 minutes   SLP Total Individual Therapy Time :   15 minutes   SLP Total Timed Code Treatment Units :   1 units   SLP Total Tx Time :   15 minutes   DURHAM, SLP, NANCY J - 2016/09/29 9:33 EST   SLP G-Codes and Modifiers   G-Code Required :   Yes   Primary Functional Limitation Reported :   Swallow   Reason for G-Code :   One Time Visit / Evaluation   Status Selection Method :   Used a single functional tool   SLP G-Code Assessment Comment :   ASHA noms   Swallow Current Status (H-1003) :   CH 0% impaired   Swallow Goal Status (G-8997) :   CH 0% impaired   Swallow Discharge Status (G-8998) :   CH 0% impaired   SLP Required Sections - Swallow :   Current Status, Goal Status, and Discharge Status   BARI, SLP, INOCENTE PARAS - 09/29/16 9:33 EST   Plan   SLP Evaluation Date :   September 29, 2016 9:30 EST   Planned Treatments :   Aphasia therapy   Plan/Goals Established With Patient/Caregiver :   Yes   Speech Clinical Assessment Summary :   Pt with functional swallowing skills noted. Pt with altered verbal expression noted but unclear of baseline status. Will attempt to determine baseline communication status and proceed with further speech evaluation as indicated.     Discussed eval with Nursing staff     SLP Evaluation Complete :   Yes   DURHAM, SLP, NANCY J - 09-29-2016 9:33 EST

## 2016-09-12 NOTE — Case Communication (Signed)
CM Discharge Planning Assessment - Text       CM Discharge Planning Assessment Entered On:  09/12/2016 8:43 EST    Performed On:  09/12/2016 8:38 EST by Chester HolsteinVARNER,  SARAH C               Home Environment   Living Environment :   No Living Environment Information Available     Affect/Behavior :   Appropriate   Lives With :   Spouse   Lives In :   Single level home   Living Situation :   Home with family support   Job Responsibilities :   Disability   Outside Facility Information :   PCP Dr Marcy Sirenharles Smith  [facility]   Chester HolsteinVARNER,  SARAH C - 09/12/2016 8:38 EST   Home Environment   ADLs :   Independent   Karolee StampsVARNER,  SARAH C - 09/12/2016 8:38 EST   Discharge Needs I   Previously Documented Discharge Needs :   DISCHARGE PLAN/NEEDS:  EQUIPMENT/TREATMENT NEEDS:       Previously Documented Benefits Information :   No discharge data available.     Discharge To :   Home with family support   Home Caregiver Name/Relationship :   Johann Capersjimmy Fross-- spouse   Home Caregiver Phone Number :   [phone]   CM Progress Note :   09/13/15 SCV: Pt was admitted with possible CVA.  Pt lives with her husband in one story home. Pt is on disability. Pt has had a previous stroke.  PT/OT/Speech ordered.  CM proposed PPD.  CM to follow up with further discharge needs. Pt has [employer].      Karolee StampsVARNER,  SARAH C - 09/12/2016 8:38 EST   Discharge Needs II   Professional Skilled Services :   Occupational Therapy, Physical Therapy, Speech/Language Pathology   Needs Assistance with Transportation :   No   Needs Assistance at Home Upon Discharge :   Yes   Discharge Planning Time Spent :   20 minutes   Karolee StampsVARNER,  SARAH C - 09/12/2016 8:38 EST   Benefits   Insurance Information :   Managed Medicare   Karolee StampsVARNER,  SARAH C - 09/12/2016 8:38 EST   Advance Directive   *Advance Directive :   No   Patient Wishes to Receive Further Information on Advance Directives :   No   Karolee StampsVARNER,  SARAH C - 09/12/2016 8:38 EST

## 2016-09-12 NOTE — Progress Notes (Signed)
Inpatient OT Evaluation - Text       Inpatient OT Evaluation Entered On:  09/12/2016 9:57 EST    Performed On:  09/12/2016 8:16 EST by RENGERING, OT, ERICA I               Reason for Treatment   Subjective Statement :   RN & pt agreeable to tx. Pt left sitting in recliner with LEs elevated, heels floating TABS engaged and all needs in reach.      *Reason for Referral :   Pt adm with suspected TIA. MD stating potential psych involvement. Awaiting full work up per MD.      Gerlean Ren, OT, ERICA I - 09/12/2016 9:50 EST   General Information   Occupational Therapy Orders :   OT Evaluation and Treatment Inpatient Acute - 09/12/16 2:49:00 EST, Stop date 09/12/16 2:49:00 EST     Precautions RTF :   No qualifying data available.     Pain Present :   No actual or suspected pain   RENGERING, OT, ERICA I - 09/12/2016 9:50 EST   Home Environment   Living Environment :   Home Environment  Lives In:  Single level home  Performed By:  Chester Holstein C 09/12/2016  Lives With:  Spouse  Performed By:  Karolee Stamps 09/12/2016  Living Situation:  Home with family support  Performed By:  Karolee Stamps 09/12/2016     Living Situation :   Home with family support   Lives With :   Spouse   Lives In :   Single level home   [place], [place], ERICA I - 09/12/2016 9:50 EST   Home Environment II   Living Environment :   No Living Environment Information Available     RENGERING, OT, ERICA I - 09/12/2016 9:50 EST   Prior Functional Status Grid   ADL :   Assist needed   Instrumental ADL :   Dependent   RENGERING, OT, ERICA I - 09/12/2016 9:50 EST   OT Basic ADL   Basic ADL Grid   Eating :   Supervision or setup   (Comment: anticipate based on functional observation [RENGERING, OT, ERICA I - 09/12/2016 9:50 EST] )   Grooming :   Supervision or setup   (Comment: anticipate based on functional observation Gerlean Ren, OT, ERICA I - 09/12/2016 9:50 EST] )   Bathing :   Moderate assistance   (Comment: anticipate based on functional observation [RENGERING, OT, ERICA I  - 09/12/2016 9:50 EST] )   UE Dressing :   Minimal contact assistance   (Comment: anticipate based on functional observation [RENGERING, OT, ERICA I - 09/12/2016 9:50 EST] )   LE Dressing :   Maximal assistance   (Comment: anticipate based on functional observation [RENGERING, OT, ERICA I - 09/12/2016 9:50 EST] )   Toileting :   Maximal assistance   (Comment: anticipate based on functional observation Gerlean Ren, OT, ERICA I - 09/12/2016 9:50 EST] )   Transfer Toilet :   Maximal assistance   (Comment: anticipate based on functional observation Gerlean Ren, OT, ERICA I - 09/12/2016 9:50 EST] )   RENGERING, OT, ERICA I - 09/12/2016 9:50 EST   ADL Comments :   Based on functioanl observaiton. Pt supine to sit with min A x 2. Pt requiring significant extra time to wash face. Pt requring max A to don socks. Pt sit to stand to RW with gait belt and min A x 2. Pt completed  room mobility to door with min A x 2. Pt stand to sit with max A for UE placement.      RENGERING, OT, ERICA I - 09/12/2016 9:50 EST   Cognition   Attention Cognition Grid   Sustained :   Impaired   RENGERING, OT, ERICA I - 09/12/2016 9:50 EST   Memory Cognition Grid   Retrieval :   Impaired   RENGERING, OT, ERICA I - 09/12/2016 9:50 EST   Executive Function Cognition Grid   Initiation :   Impaired   Problem Solving Skills :   Impaired   RENGERING, OT, ERICA I - 09/12/2016 9:50 EST   Standing Balance   Static Standing Balance   Stands with Two UE Support :   Rehab Minimal assistance   RENGERING, OT, ERICA I - 09/12/2016 9:50 EST   UE Strength/ROM   Upper Extremity Overall ROM Grid   Left Upper Extremity Active Range :   Impaired   Right Upper Extremity Active Range :   Impaired   RENGERING, OT, ERICA I - 09/12/2016 9:50 EST   Document Left UE Range Description :   Yes   Document Right UE Range Description :   Yes   Lt Upper Extremity Strength :   Other: unable to fully test due to pt lethargy and decreased attention at this time, will continue to assess   RENGERING, OT, ERICA I  - 09/12/2016 9:50 EST   Left Upper Extremity Strength Grid   Scapular Elevation :   2+   Shoulder Abduction :   2+   Shoulder External Rotation :   2+   Elbow Flexion :   3+   Elbow Extension :   3+   Forearm Pronation :   3+   Forearm Supination :   3+   Wrist Flexion :   3+   Wrist Extension :   3+   Finger Flexion :   4   Finger Extension :   4   RENGERING, OT, ERICA I - 09/12/2016 9:50 EST   Right Upper Extremity Strength Grid   Scapular Elevation :   2+   Shoulder Abduction :   2+   Shoulder External Rotation :   2+   Elbow Flexion :   3+   Elbow Extension :   3+   Forearm Pronation :   3+   Forearm Supination :   3+   Wrist Flexion :   3+   Wrist Extension :   3+   Finger Flexion :   3+   Finger Extension :   3+   RENGERING, OT, ERICA I - 09/12/2016 9:50 EST   Left UE ROM   Left Upper Extremity Passive Range   Shoulder Flexion 0-180 :   Within functional limits   Shoulder Extension 0-45 :   Within functional limits   Shoulder Abduction 0-180 :   Within functional limits   Shoulder External Rotation 0-90 :   Within functional limits   Shoulder Internal Rotation :   Within functional limits   Elbow Flexion 0-145 :   Within functional limits   Elbow Extension :   Within functional limits   Forearm Pronation 0-90 :   Within functional limits   Forearm Supination 0-90 :   Within functional limits   Wrist Flexion 0-80 :   Within functional limits   Wrist Extension 0-70 :   Within functional limits   MP Flexion 0-90 :  Within functional limits   MP Extension 0-45 :   Within functional limits   Thumb Flexion 0-50 :   Within functional limits   Thumb Extension 0-20 :   Within functional limits   RENGERING, OT, ERICA I - 09/12/2016 9:50 EST   Left Upper Extremity Active Range   Shoulder Flexion 0-180 :   Moderately limited   Shoulder Extension 0-45 :   Within functional limits   Shoulder Abduction 0-180 :   Moderately limited   Shoulder External Rotation 0-90 :   Moderately limited   Shoulder Internal Rotation 0-90 :   Within  functional limits   Elbow Flexion 0-145 :   Within functional limits   Elbow Extension 0-0 :   Within functional limits   Forearm Pronation 0-90 :   Within functional limits   Forearm Supination 0-90 :   Within functional limits   Wrist Flexion 0-180 :   Within functional limits   Wrist Extension 0-70 :   Within functional limits   MP Flexion 0-90 :   Within functional limits   MP Extension 0-45 :   Within functional limits   Thumb Flexion 0-50 :   Within functional limits   Thumb Extension 0-20 :   Within functional limits   RENGERING, OT, ERICA I - 09/12/2016 9:50 EST   Right UE ROM   Right Upper Extremity Passive ROM Grid   Shoulder Flexion 0-180 :   Within functional limits   Shoulder Extension 0-45 :   Within functional limits   Shoulder Abduction 0-180 :   Within functional limits   Shoulder External Rotation 0-90 :   Within functional limits   Shoulder Internal Rotation 0-90 :   Within functional limits   Elbow Flexion 0-145 :   Within functional limits   Elbow Extension 0-0 :   Within functional limits   Forearm Pronation 0-90 :   Within functional limits   Forearm Supination 0-90 :   Within functional limits   Wrist Flexion 0-80 :   Within functional limits   Wrist Extension 0-70 :   Within functional limits   MP Flexion 0-90 :   Within functional limits   MP Extension 0-45 :   Within functional limits   Thumb Flexion 0-50 :   Within functional limits   Thumb Extension 0-20 :   Within functional limits   RENGERING, OT, ERICA I - 09/12/2016 9:50 EST   Right Upper Extremity Active ROM Grid   Shoulder Flexion 0-180 :   Moderately limited   Shoulder Extension 0-45 :   Within functional limits   Shoulder Abduction 0-180 :   Moderately limited   Shoulder External Rotation 0-90 :   Moderately limited   Shoulder Internal Rotation 0-90 :   Within functional limits   Elbow Flexion 0-145 :   Within functional limits   Elbow Extension 0-0 :   Within functional limits   Forearm Pronation 0-90 :   Within functional limits    Forearm Supination 0-90 :   Within functional limits   Wrist Flexion 0-80 :   Within functional limits   Wrist Extension 0-70 :   Within functional limits   MP Flexion 0-90 :   Within functional limits   MP Extension 0-45 :   Within functional limits   Thumb Flexion 0-50 :   Within functional limits   Thumb Extension 0-20 :   Within functional limits   RENGERING, OT, ERICA I - 09/12/2016 9:50  EST   UE Sensation   Impact of Impaired UE Sensation :   unable to follow multistep commands   RENGERING, OT, ERICA I - 09/12/2016 9:50 EST   UE Coordination   Left :   18.92   Right :   17.38   RENGERING, OT, ERICA I - 09/12/2016 9:50 EST   Left Upper Extremity Coordination Grid   Finger to Nose :   Impaired   Finger Opposition :   Impaired   RENGERING, OT, ERICA I - 09/12/2016 9:50 EST   Right Upper Extremty Coordination Grid   Finger to Nose :   Impaired   Finger Opposition :   Impaired   RENGERING, OT, ERICA I - 09/12/2016 9:50 EST   Long Term Goals   Patient/Caregiver Goals :   return to highest level of indep   OT LT Goals Reviewed :   Yes   RENGERING, OT, ERICA I - 09/12/2016 9:50 EST   Short Term Goals   Grooming Goal Grid     Goal #1          Activity :    Grooming routine              Descriptors :    Unsupported short sit              Assist :    Setup                RENGERING, OT, ERICA I - 09/12/2016 9:50 EST         Upper Body Dressing Short Term Goal Grid     Goal #1          Activity :    Upper extremity dressing              Assist :    Setup                RENGERING, OT, ERICA I - 09/12/2016 9:50 EST         Lower Body Dressing Grid     Goal #1          Activity :    Lower extremity dressing              Assist :    Moderate assistance                RENGERING, OT, ERICA I - 09/12/2016 9:50 EST         Bathing Goal Grid     Goal #1          Descriptors :    Sponge bath              Assist :    Minimal assistance                RENGERING, OT, ERICA I - 09/12/2016 9:50 EST         OT ST Goals Reviewed :   Yes   RENGERING, OT,  ERICA I - 09/12/2016 9:50 EST   Plan   OT Evaluation Date :   09/12/2016 EST   OT Frequency Acute :   Mo/Tu/We/Th/Fr   Duration :   3    Duration Unit :   Weeks   Planned Treatments :   Balance training, Basic Activities of Daily Living, Cognitive training, Coordination, Energy conservation training, Equipment training, Mobility training, Patient education, Safety education, Therapeutic activities, Therapeutic exercises, Therapeutic exercises for strengthening and ROM, Visual and perceptual training  Treatment Plan/Goals Established With Patient/Caregiver :   Yes   OT Evaluation Complete :   Yes   RENGERING, OT, ERICA I - 09-29-2016 9:50 EST   Time Spent With Patient   OT Time In :   8:16 EST   OT Time Out :   8:39 EST   OT Evaluation Units, Moderate Complexity :   1 Unit   OT Individual Eval Time, Moderate Complexity :   15 minutes   OT FUNCTIONAL TRNG 15 MIN :   1    OT Functional Activities Minutes :   8 minutes   OT Total Individual Therapy Time :   23 minutes   OT Total Timed Code Treatment Units :   1 units   OT Total Timed Code Treatment Minutes :   8 minutes   OT Total Untimed Code Treatment Minutes :   15 minutes   OT Total Treatment Time Rehab :   23 minutes   RENGERING, OT, ERICA I - 09-29-2016 9:50 EST   Assessment   OT Impairments or Limitations :   Balance deficits, Basic activity of daily living deficits, Cognitive deficits, Coordination deficits, Endurance deficits, Equipment training, Mobility deficits, Proprioception deficits, Range of motion deficits, Safety awareness deficits, Strength deficits   Barriers to Safe Discharge OT :   Safety awareness, Severity of deficits   OT Discharge Recommendations :   recommend subacute rehab      OT Treatment Recommendations :   Pt presents with decreased shoulder ROM, initation, problem solving and requiring significant extra time to complete tasks. Pt would benefit from IP OT services to address above noted deficits.      RENGERING, OT, ERICA I - 09-29-16 9:50 EST    OT G-Codes and Modifiers   G-Code Required :   Yes   Primary Functional Limitation Reported :   Self-Care   Reason for G-Code :   Initial assessment   Status Selection Method :   Used clinical judgment   Self-Care Current Status (Y-3016) :   CK At least 40% but less than 60% impaired   Self-Care Goal Status (W-1093) :   CJ At least 20% but less than 40% impaired   OT Required Sections - Self Care :   Current Status and Goal Status   RENGERING, OT, ERICA I - 09-29-16 9:50 EST

## 2016-09-12 NOTE — Nursing Note (Signed)
Adult Patient History Form-Text       Adult Patient History Entered On:  09/12/2016 2:11 EST    Performed On:  09/12/2016 2:06 EST by Wilford Sports, RN, SHARNICE R               General Info   Preferred Name :   Lindsay Blackburn   Accompanied By :   Spouse   In Charge of News (ICON) Name :   [name] (Spouse)    Demonstrates Signs, Symptoms of the Following Condition :   Stroke   Information Given By :   Self   Primary Language :   English   Pregnancy Status :   Patient denies   Has the patient received chemotherapy or biotherapy within the last 48 hours? :   No   Is the patient currently (2-3 days) receiving radiation treatment? :   No   BOULWARE, RN, SHARNICE R - 09/12/2016 2:06 EST   Problem History   (As Of: 09/12/2016 02:11:19 EST)   Problems(Active)    At risk for infection (SNOMED CT  :629528413 )  Name of Problem:   At risk for infection ; Recorder:   SYSTEM,  SYSTEM; Confirmation:   Confirmed ; Classification:   Interdisciplinary ; Code:   [id number] ; Last Updated:   05/01/2016 12:05 EDT ; Life Cycle Date:   05/01/2016 ; Life Cycle Status:   Active ; Vocabulary:   SNOMED CT   ; Comments:        05/01/2016 12:05 - SYSTEM,  SYSTEM  Problem added automatically by system based on initiation of Risk For Infection Plan of Care      At risk for injury (SNOMED CT  :536644034 )  Name of Problem:   At risk for injury ; Recorder:   SYSTEM,  SYSTEM; Confirmation:   Confirmed ; Classification:   Interdisciplinary ; Code:   [id number] ; Last Updated:   05/01/2016 6:13 EDT ; Life Cycle Date:   05/01/2016 ; Life Cycle Status:   Active ; Vocabulary:   SNOMED CT   ; Comments:        05/01/2016 6:13 - SYSTEM,  SYSTEM  Problem added automatically by system based on initiation of Risk for Injury Plan of Care      At risk of pressure ulcer (SNOMED CT  :7564332951 )  Name of Problem:   At risk of pressure ulcer ; Recorder:   SYSTEM,  SYSTEM; Confirmation:   Confirmed ; Classification:   Nursing ; Code:   [id number] ; Last Updated:   05/01/2016 12:05 EDT ;  Life Cycle Date:   05/01/2016 ; Life Cycle Status:   Active ; Vocabulary:   SNOMED CT        Bipolar disorder (IMO  :16010 )  Name of Problem:   Bipolar disorder ; Recorder:   TAYLOR, RN, JENNIFER S; Confirmation:   Confirmed ; Classification:   Medical ; Code:   [phone] ; Contributor System:   Dietitian ; Last Updated:   10/22/2015 16:28 EST ; Life Cycle Date:   10/22/2015 ; Life Cycle Status:   Active ; Vocabulary:   IMO        Diabetes (SNOMED CT  :[id number] )  Name of Problem:   Diabetes ; Recorder:   Delora Fuel, RN, STEPHEN; Confirmation:   Confirmed ; Classification:   Medical ; Code:   [id number] ; Contributor System:   Dietitian ; Last Updated:   08/19/2015 17:45 EST ; Life Cycle Date:   08/19/2015 ;  Life Cycle Status:   Active ; Vocabulary:   SNOMED CT        Hypercholesteremia (IMO  :16109 )  Name of Problem:   Hypercholesteremia ; Recorder:   Delora Fuel, RN, STEPHEN; Confirmation:   Confirmed ; Classification:   Medical ; Code:   [phone] ; Contributor System:   PowerChart ; Last Updated:   08/19/2015 17:45 EST ; Life Cycle Date:   08/19/2015 ; Life Cycle Status:   Active ; Vocabulary:   IMO        Hypertension (SNOMED CT  :09811914 )  Name of Problem:   Hypertension ; Recorder:   Delora Fuel, RN, STEPHEN; Confirmation:   Confirmed ; Classification:   Medical ; Code:   78295621 ; Contributor System:   PowerChart ; Last Updated:   08/19/2015 17:45 EST ; Life Cycle Date:   08/19/2015 ; Life Cycle Status:   Active ; Vocabulary:   SNOMED CT        Impaired tissue integrity (SNOMED CT  :30865784 )  Name of Problem:   Impaired tissue integrity ; Recorder:   SYSTEM,  SYSTEM; Confirmation:   Confirmed ; Classification:   Interdisciplinary ; Code:   69629528 ; Last Updated:   05/01/2016 12:05 EDT ; Life Cycle Date:   05/01/2016 ; Life Cycle Status:   Active ; Vocabulary:   SNOMED CT   ; Comments:        05/01/2016 12:05 - SYSTEM,  SYSTEM  Problem added automatically by system  based on initiation of Impaired Tissue Integrity Plan of Care      Impaired verbal communication (SNOMED CT  :41324401 )  Name of Problem:   Impaired verbal communication ; Recorder:   SYSTEM,  SYSTEM; Confirmation:   Confirmed ; Classification:   Interdisciplinary ; Code:   02725366 ; Last Updated:   05/01/2016 6:13 EDT ; Life Cycle Date:   05/01/2016 ; Life Cycle Status:   Active ; Vocabulary:   SNOMED CT   ; Comments:        05/01/2016 6:13 - SYSTEM,  SYSTEM  Problem added automatically by system based on initiation of Impaired Communication Plan of Care      Knowledge deficit (SNOMED CT  :4403474259 )  Name of Problem:   Knowledge deficit ; Recorder:   SYSTEM,  SYSTEM; Confirmation:   Confirmed ; Classification:   Interdisciplinary ; Code:   [id number] ; Last Updated:   05/01/2016 12:05 EDT ; Life Cycle Date:   05/01/2016 ; Life Cycle Status:   Active ; Vocabulary:   SNOMED CT   ; Comments:        05/01/2016 12:05 - SYSTEM,  SYSTEM  Problem added automatically by system based on initiation of Knowledge Deficit Plan of Care        Diagnoses(Active)    Anxiety  Date:   09/12/2016 ; Diagnosis Type:   Discharge ; Confirmation:   Confirmed ; Clinical Dx:   Anxiety ; Classification:   Medical ; Clinical Service:   Non-Specified ; Code:   ICD-10-CM ; Probability:   0 ; Diagnosis Code:   F41.9      Difficulty speaking  Date:   09/11/2016 ; Diagnosis Type:   Reason For Visit ; Confirmation:   Confirmed ; Clinical Dx:   Difficulty speaking ; Classification:   Medical ; Clinical Service:   Emergency medicine ; Code:   PNED ; Probability:   0 ; Diagnosis Code:   [id number]      Potential stroke  Date:   09/11/2016 ; Diagnosis Type:   Reason For Visit ; Confirmation:   Complaint of ; Clinical Dx:   Potential stroke ; Classification:   Medical ; Clinical Service:   Non-Specified ; Code:   PNED ; Probability:   0 ; Diagnosis Code:   [id number]      Transient ischemic attack (TIA)  Date:    09/12/2016 ; Diagnosis Type:   Discharge ; Confirmation:   Confirmed ; Clinical Dx:   Transient ischemic attack (TIA) ; Classification:   Medical ; Clinical Service:   Non-Specified ; Code:   ICD-10-CM ; Probability:   0 ; Diagnosis Code:   G45.9        Family History   Family History   (As Of: 09/12/2016 02:11:20 EST)     Immunizations   Influenza Vaccine Status :   Patient refused   Last Tetanus :   Less than 5 years   BOULWARE, RN, SHARNICE R - 09/12/2016 2:06 EST   ID Risk Screen   Patient Recent Travel History :   No recent travel   Family Member Travel History :   No recent travel   Coker, RN, SHARNICE R - 09/12/2016 2:06 EST   Chills :   No   Cough (Any Duration) :   No   Fever :   No   Hemoptysis (Blood in Sputum) :   No   Night Sweats :   No   BOULWARE, RN, SHARNICE R - 09/12/2016 2:06 EST   C. diff Screen   Have you had 3 or more loose/watery stool in 24 hours? :   No   BOULWARE, RN, SHARNICE R - 09/12/2016 2:06 EST   Procedure History        -    Procedure History   (As Of: 09/12/2016 02:11:20 EST)     Anesthesia Minutes:   0 ; Procedure Name:   Tubal ligation ; Procedure Minutes:   0            Anesthesia/Sedation   Anesthesia History :   Prior general anesthesia   Anesthesia Reaction :   None   Moderate Sedation History :   Prior sedation for procedure   Wilford Sports, RN, SHARNICE R - 09/12/2016 2:06 EST   Transfusion/Bloodless Med   Transfusion History :   Unknown   Will Patient Accept Blood Transfusion and/or Blood Products :   Yes   BOULWARE, RN, SHARNICE R - 09/12/2016 2:06 EST   Nutrition   Home Diet :   Regular   Appetite :   Poor   BOULWARE, RN, SHARNICE R - 09/12/2016 2:06 EST   Functional   ADLs :   Minimal assist   BOULWARE, RN, SHARNICE R - 09/12/2016 2:06 EST   Functional Assessment   Bathing :   Requires assistance (1)   Dressing :   Requires assistance (1)   Toileting :   Requires assistance (1)   Transferring Bed or Chair :   Dependent (0)   Continence :   Requires assistance (1)   Feeding :   Independent with  assistive device (2)   ADL Index Score :   6    BOULWARE, RN, SHARNICE R - 09/12/2016 2:06 EST   Living and Resources   Living Situation :   Home with family support   Wilford Sports RN, SHARNICE R - 09/12/2016 2:06 EST   Social History   Social History   (As Of: 09/12/2016 02:11:20 EST)  Tobacco:        Never smoker   (Last Updated: 09/12/2016 00:53:41 EST by Pryor MontesGeiman, RN, Vance GatherShelley)          Alcohol:        Denies   (Last Updated: 09/12/2016 00:53:45 EST by Pryor MontesGeiman, RN, Vance GatherShelley)          Substance Abuse:        Denies   (Last Updated: 09/12/2016 00:53:48 EST by Pryor MontesGeiman, RN, Vance GatherShelley)            Spiritual   Do You Receive Comfort From Spiritual Practices :   Yes   Faith/Denomination :   [facility]   Hospital Chaplain to Visit :   Yes   Wilford SportsBOULWARE, RN, SHARNICE R - 09/12/2016 2:06 EST   Advance Directive   *Advance Directive :   No   Wilford SportsBOULWARE, RN, SHARNICE R - 09/12/2016 2:06 EST   Educ Needs   Barriers to Learning :   Cognitive deficits   Wilford SportsBOULWARE, RN, SHARNICE R - 09/12/2016 2:06 EST   DC Needs   Home Caregiver Name/Relationship :   Johann Capersjimmy Pabst-- spouse   Home Caregiver Phone Number :   [phone]  [phone]   Wilford SportsBOULWARE, RN, SHARNICE R - 09/12/2016 2:06 EST

## 2016-09-12 NOTE — Consults (Signed)
Consultation                 [facility]  Ned ClinesW. Toby Latrisa Hellums, MD  Service Date: 09/12/2016    HISTORY OF PRESENT ILLNESS:  Ms. Lindsay Blackburn is a 56 year old individual  seen at the request of her floor nurse who stopped me in the hallway  and asked me if I were a pulmonary doctor because Dr. Inda MerlinHickman had told  her that the patient might need a pulmonary consultation because of an  irregular respiratory rate and pattern.  I saw the patient in  consultation and reviewed her record; she is a very poor historian.   This tracks through the entire record.  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  She does not appear to be on lithium.   She came to the Emergency Room today because of the possibility of  having had a stroke, although her workup so far is equivocal about  this.  She has been to the Emergency Room on several occasions for  tachypnea and a sense of air hunger and breathlessness with negative  workups.  She is not recognized in the record to have any kind of lung  disease, although she is overweight.  She has a number of other  medical conditions, which have made her a quite ill person at an early  age.  She has diabetes, hypertension, a lipid disorder and bipolar  disorder that has required psychiatric institutionalization and  hospitalization for treatment.  Her complaints of shortness of breath  track through the record for quite a while and she has never been  found to have any pulmonary reason for it.  A lactate level was 2.2 in  the Emergency Department.  She has never had really any oxygen  saturation abnormalities and in fact, the nurse points out that her O2  saturation has been 100% the entire time she has been here.  A chest  x-ray is reviewed and shows low lung volumes consistent with her  obesity.     PAST MEDICAL HISTORY:  1.  Diabetes.  2.  Hypertension.  3.  Bipolar disorder.  4.  Lipid disorder.    5.  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    SOCIAL HISTORY:  Negative for tobacco or alcohol use.    ALLERGIES:  She has no known allergies.    ADMITTING MEDICATIONS:  Include amlodipine 10 mg a day, aspirin 81 mg  a day, atorvastatin 10 mg a day, benztropine 1 mg b.i.d., Haldol 2 mg  once a day, divalproex sodium 500 mg 2 tablets a.m., metformin 500 mg  b.i.d. and pioglitazone 30 mg 1 p.o. daily.  She is also on labetalol  200 mg p.o. b.i.d.    PHYSICAL EXAMINATION:  Reveals blood pressure is quite adequate at  120/70, O2 saturation 100%, weight 102 kg.  She is afebrile.  HEENT:   Shows a thick neck and a sleep apnea compatible airway.  She has a  blank stare on her face.  She answers questions poorly.  She has lip  smacking.  She has intermittent tachypnea and hyperpnea with pauses  that suggests the presence of a background Cheyne-Stokes breathing  pattern.  Her lungs do not show any wheezes, rales or rhonchi.  There  is no cardiovascular abnormality such as atrial fibrillation or  murmur.  An echocardiogram was done today and was unrevealing.  Her  abdomen is not surgical or distended.  She is obese and overweight.    LABORATORY DATA:  Reveals a chloride of 102, a CO2 of 18, creatinine  1.4, a calcium of 10.2.  A white count of 12,700.  A potassium of 4.3.   She has an anion gap of 16; this is upper limit of normal.    Chest radiograph reviewed, low lung volumes and mild cardiomegaly.  CT  angiogram of neck reveals no large vessel occlusion, severe stenosis  of the proximal left P2 segment, moderately severe stenosis of the  inferior right M2 branch, no hemodynamically significant stenosis of  carotid or vertebral arteries.  There is a 40% left carotid artery  stenosis.    ASSESSMENT:  1.  Mild (nonlactate accumulation) metabolic acidosis (versus chronic  respiratory alkalosis)with mild elevation in blood sugar.  Alternatively, this   could be a depressedCO2level on BMP from chronic  hyperventilation.    This could be a chronic  respiratory alkalosis and not a metabolic acidosis.  With the normal  lactate, it is unlikely metformin-associated acidosis.  She does not  appear to have features of sepsis or infection.  She does not appear  to have underlying lung disease.  The breathing pattern is probably  related to her psychiatric disorder, her psychiatric medication, or  Cheyne-Stokes periodic breathing pattern associated with her brain  disease.  2.  Obesity.  3.  Risk factors for obstructive sleep apnea but I did not observe her  to have this during the encounter, during which she did fall asleep  briefly.    PLAN:  1.  Oxygen therapy is not necessary.  2.  Attention to her psychiatric and metabolic details.  3.  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  Her  chronic respiratory alkalosis could be secondary to this.  No therapy  is necessary at this time.  Nursing staff counseled.      Ned Clines, MD  TR: *n DD: 09/12/2016 16:41 TD: 09/12/2016 17:28 Job#: [id number]  \\[id number]#: [id number]  \\[id number]\\      cc:  Lynann Beaver MD       Marcy Siren     Signature Line     Electronically Signed on 09/12/2016 10:12 PM EST   ________________________________________________   Keary Hanak-MD, Eugene Garnet               Modified by: Marlane Mingle on 09/12/2016 10:12 PM EST

## 2016-09-12 NOTE — Transfer Summaries (Signed)
Intrahospital Transfer - Text       Intrahospital Transfer Entered On:  09/12/2016 1:38 EST    Performed On:  09/12/2016 1:37 EST by Pryor Montes, RN, Vance Gather               Transfer   Transfer Mode :   Stretcher   Nurse Receiving Report :   Letta Kocher    Date/Time Nurse Received Report :   09/12/2016 1:20 EST   Patient's Transfer Condition :   Stable   Transfer Notification :   Spouse   Accompanied By Staff :   Nurse   (Comment: On Monitor. Pryor Montes RN, Vance Gather - 09/12/2016 1:37 EST] )   Pryor Montes RN, Vance Gather - 09/12/2016 1:37 EST

## 2016-09-13 NOTE — Progress Notes (Signed)
Inpatient PT Examination - Text       Inpatient PT Examination Entered On:  09/13/2016 8:51 EST    Performed On:  09/12/2016 8:44 EST by Montez Morita, PT, [place] G               Reason for Treatment   Subjective Statement :   pt/RN agreeable to PT evaluation.  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     *Reason for Referral :   adm for possible CVA.    PT eval/tx     CARTER, PT, [place] G - 09/13/2016 8:44 EST   General Info   Physical Therapy Orders :   PT Evaluation and Treatment Acute - 09/12/16 2:49:00 EST, Stop date 09/12/16 2:49:00 EST     Precautions RTF :    Notify Provider Laboratory Results, 09/12/16 2:54:00 EST, Call MD for BG greater than 350 x 2 or greater than 400 x 1 on any schedule, Ordered   Notify Provider, 09/12/16 2:49:00 EST, Notify physician for any increased deficits., 09/12/16 2:49:00 EST, 09/12/16 2:49:00 EST, Ordered   Notify Provider, 09/12/16 2:49:00 EST, Failed nursing swallow  screen, 09/12/16 2:49:00 EST, 09/12/16 2:49:00 EST, Ordered   Notify Provider Laboratory Results, 09/12/16 2:49:00 EST, Blood glucose less than 70 or greater than 200 mg/dL (x 2 consecutive measurements), Ordered   Notify Provider Vital Signs, 09/12/16 2:49:00 EST, SBP greater than 220 or less than 110, Ordered   Notify Provider Vital Signs, 09/12/16 2:49:00 EST, DBP greater than 120, Ordered   Notify Rapid Response Team, 09/12/16 2:49:00 EST, For concerns regarding patient condition & notify MD, 09/12/16 2:49:00 EST, 09/12/16 2:49:00 EST, Ordered   Change attending to, 09/12/16 0:33:00 EST, HICKMAN-MD,  CARY S, Ordered     Orientation Assessment :   Not oriented to situation, Not oriented to time   Affect/Behavior :   Appropriate, Other: extremely drowsy in the AM and difficulty waking up.   Basic Command Following :   Intact   Safety/Judgment :   Impaired   Pain Present :   No actual or suspected pain   CARTER, PT, [place] G - 09/13/2016 8:44 EST    Problem List   (As Of: 09/13/2016 08:51:05 EST)   Problems(Active)    At risk for falls (SNOMED CT  :161096045 )  Name of Problem:   At risk for falls ; Onset Date:   09/12/2016 ; Recorder:   SYSTEM,  SYSTEM; Confirmation:   Confirmed ; Classification:   Interdisciplinary ; Code:   [id number] ; Last Updated:   09/12/2016 17:13 EST ; Life Cycle Date:   09/12/2016 ; Life Cycle Status:   Active ; Vocabulary:   SNOMED CT   ; Comments:        09/12/2016 17:13 - SYSTEM,  SYSTEM  Problem added by Discern Expert      At risk for infection (SNOMED CT  :782956213 )  Name of Problem:   At risk for infection ; Recorder:   SYSTEM,  SYSTEM; Confirmation:   Confirmed ; Classification:   Interdisciplinary ; Code:   086578469 ; Last Updated:   05/01/2016 12:05 EDT ; Life Cycle Date:   05/01/2016 ; Life Cycle Status:   Active ; Vocabulary:   SNOMED CT   ; Comments:        05/01/2016 12:05 - SYSTEM,  SYSTEM  Problem added automatically by system based on  initiation of Risk For Infection Plan of Care      At risk for injury (SNOMED CT  :161096045 )  Name of Problem:   At risk for injury ; Recorder:   SYSTEM,  SYSTEM; Confirmation:   Confirmed ; Classification:   Interdisciplinary ; Code:   409811914 ; Last Updated:   05/01/2016 6:13 EDT ; Life Cycle Date:   05/01/2016 ; Life Cycle Status:   Active ; Vocabulary:   SNOMED CT   ; Comments:        05/01/2016 6:13 - SYSTEM,  SYSTEM  Problem added automatically by system based on initiation of Risk for Injury Plan of Care      At risk of pressure ulcer (SNOMED CT  :7829562130 )  Name of Problem:   At risk of pressure ulcer ; Recorder:   SYSTEM,  SYSTEM; Confirmation:   Confirmed ; Classification:   Nursing ; Code:   [id number] ; Last Updated:   05/01/2016 12:05 EDT ; Life Cycle Date:   05/01/2016 ; Life Cycle Status:   Active ; Vocabulary:   SNOMED CT        Bipolar disorder (IMO  :95284 )  Name of Problem:   Bipolar disorder ; Recorder:   TAYLOR, RN, JENNIFER S; Confirmation:   Confirmed ; Classification:    Medical ; Code:   [phone] ; Contributor System:   Dietitian ; Last Updated:   09/12/2016 9:05 EST ; Life Cycle Date:   09/12/2016 ; Life Cycle Status:   Active ; Vocabulary:   IMO        Diabetes (SNOMED CT  :[id number] )  Name of Problem:   Diabetes ; Recorder:   Delora Fuel, RN, STEPHEN; Confirmation:   Confirmed ; Classification:   Medical ; Code:   [id number] ; Contributor System:   Dietitian ; Last Updated:   09/12/2016 9:05 EST ; Life Cycle Date:   09/12/2016 ; Life Cycle Status:   Active ; Vocabulary:   SNOMED CT        Hypercholesteremia (IMO  :70623 )  Name of Problem:   Hypercholesteremia ; Recorder:   Delora Fuel, RN, STEPHEN; Confirmation:   Confirmed ; Classification:   Medical ; Code:   [phone] ; Contributor System:   PowerChart ; Last Updated:   08/19/2015 17:45 EST ; Life Cycle Date:   08/19/2015 ; Life Cycle Status:   Active ; Vocabulary:   IMO        Hypertension (SNOMED CT  :15176160 )  Name of Problem:   Hypertension ; Recorder:   Delora Fuel, RN, STEPHEN; Confirmation:   Confirmed ; Classification:   Medical ; Code:   73710626 ; Contributor System:   PowerChart ; Last Updated:   09/12/2016 9:05 EST ; Life Cycle Date:   09/12/2016 ; Life Cycle Status:   Active ; Vocabulary:   SNOMED CT        Impaired tissue integrity (SNOMED CT  :94854627 )  Name of Problem:   Impaired tissue integrity ; Recorder:   SYSTEM,  SYSTEM; Confirmation:   Confirmed ; Classification:   Interdisciplinary ; Code:   03500938 ; Last Updated:   05/01/2016 12:05 EDT ; Life Cycle Date:   05/01/2016 ; Life Cycle Status:   Active ; Vocabulary:   SNOMED CT   ; Comments:        05/01/2016 12:05 - SYSTEM,  SYSTEM  Problem added automatically by system based on initiation of Impaired Tissue Integrity Plan of Care  Impaired verbal communication (SNOMED CT  :9604540953465011 )  Name of Problem:   Impaired verbal communication ; Recorder:   SYSTEM,  SYSTEM; Confirmation:   Confirmed ; Classification:    Interdisciplinary ; Code:   [id number] ; Last Updated:   05/01/2016 6:13 EDT ; Life Cycle Date:   05/01/2016 ; Life Cycle Status:   Active ; Vocabulary:   SNOMED CT   ; Comments:        05/01/2016 6:13 - SYSTEM,  SYSTEM  Problem added automatically by system based on initiation of Impaired Communication Plan of Care      Knowledge deficit (SNOMED CT  :[phone] )  Name of Problem:   Knowledge deficit ; Recorder:   SYSTEM,  SYSTEM; Confirmation:   Confirmed ; Classification:   Interdisciplinary ; Code:   [phone] ; Last Updated:   05/01/2016 12:05 EDT ; Life Cycle Date:   05/01/2016 ; Life Cycle Status:   Active ; Vocabulary:   SNOMED CT   ; Comments:        05/01/2016 12:05 - SYSTEM,  SYSTEM  Problem added automatically by system based on initiation of Knowledge Deficit Plan of Care        Diagnoses(Active)    Anxiety  Date:   09/12/2016 ; Diagnosis Type:   Discharge ; Confirmation:   Confirmed ; Clinical Dx:   Anxiety ; Classification:   Medical ; Clinical Service:   Non-Specified ; Code:   ICD-10-CM ; Probability:   0 ; Diagnosis Code:   F41.9      Bipolar disorder  Date:   09/12/2016 ; Diagnosis Type:   Discharge ; Confirmation:   Confirmed ; Clinical Dx:   Bipolar disorder ; Classification:   Medical ; Code:   ICD-10-CM ; Probability:   0 ; Diagnosis Code:   F31.9      Diabetes  Date:   09/12/2016 ; Diagnosis Type:   Discharge ; Confirmation:   Confirmed ; Clinical Dx:   Diabetes ; Classification:   Medical ; Code:   ICD-10-CM ; Probability:   0 ; Diagnosis Code:   E13.9      Difficulty speaking  Date:   09/11/2016 ; Diagnosis Type:   Reason For Visit ; Confirmation:   Confirmed ; Clinical Dx:   Difficulty speaking ; Classification:   Medical ; Clinical Service:   Emergency medicine ; Code:   PNED ; Probability:   0 ; Diagnosis Code:   [id number]      Dyspnea  Date:   09/12/2016 ; Diagnosis Type:   Discharge ; Confirmation:   Confirmed ; Clinical Dx:   Dyspnea ; Classification:   Medical ; Code:    ICD-10-CM ; Probability:   0 ; Diagnosis Code:   R06.00      Hypertension  Date:   09/12/2016 ; Diagnosis Type:   Discharge ; Confirmation:   Confirmed ; Clinical Dx:   Hypertension ; Classification:   Medical ; Code:   ICD-10-CM ; Probability:   0 ; Diagnosis Code:   I10      Metabolic acidosis  Date:   09/12/2016 ; Diagnosis Type:   Discharge ; Confirmation:   Confirmed ; Clinical Dx:   Metabolic acidosis ; Classification:   Medical ; Code:   ICD-10-CM ; Probability:   0 ; Diagnosis Code:   E87.2      Transient ischemic attack (TIA)  Date:   09/12/2016 ; Diagnosis Type:   Discharge ; Confirmation:   Confirmed ; Clinical Dx:  Transient ischemic attack (TIA) ; Classification:   Medical ; Clinical Service:   Non-Specified ; Code:   ICD-10-CM ; Probability:   0 ; Diagnosis Code:   G45.9        Home Environment   Living Environment :   Home Environment  Lives In:  Single level home  Performed By:  Chester Holstein C 09/12/2016  Lives With:  Spouse  Performed By:  Karolee Stamps 09/12/2016  Living Situation:  Home with family support  Performed By:  Karolee Stamps 09/12/2016  *ADL:  Assist needed  Performed By:  Pete Pelt, ERICA I 09/12/2016  *Instrumental ADL:  Dependent  Performed By:  Pete Pelt, ERICA I 09/12/2016     Living Situation :   Home with family support   Lives With :   Spouse   Lives In :   Single level home   [place], [place], [place] G - 09/13/2016 8:44 EST   Home Setup   Primary Bedroom :   1st floor   Primary Bathroom :   1st floor   Kitchen :   1st floor   Laundry :   1st floor   [place], [place], [place] G - 09/13/2016 8:44 EST   Home Environment II   Living Environment :   No Living Environment Information Available     [place], PT, [place] G - 09/13/2016 8:44 EST   Prior Functional Status   ADL :   Assist needed   Mobility :   Assist needed   Instrumental ADL :   Dependent   CARTER, PT, [place] G - 09/13/2016 8:44 EST   Additional Information :   pt uses a RW at home.  Husband support for ADLs.   CARTER, PT,  [place] G - 09/13/2016 8:44 EST   LE Range/Strength   Left Lower Extremity Strength Grid   Hip Flexion :   4   Hip Extension :   4   Hip Abduction :   4   Hip Adduction :   4   Hip External Rotation :   4   Hip Internal Rotation :   4   Knee Flexion :   4   Knee Extension :   4   Ankle Dorsiflexion :   4   Ankle Plantarflexion :   4   Ankle Eversion :   4   Ankle Inversion :   4   Great Toe Extension :   4   CARTER, PT, [place] G - 09/13/2016 8:44 EST   Right Lower Extremity Strength Grid   Hip Flexion :   4   Hip Extension :   4   Hip Abduction :   4   Hip Adduction :   4   Hip External Rotation :   4   Hip Internal Rotation :   4   Knee Flexion :   4   Knee Extension :   4   Ankle Dorsiflexion :   4   Ankle Plantarflexion :   4   Ankle Eversion :   4   Ankle Inversion :   4   Great Toe Extension :   4   [place], PT, [place] G - 09/13/2016 8:44 EST   UE Strength/ROM   Lt Upper Extremity Strength :   Other: see OT evaluation   CARTER, PT, [place] G - 09/13/2016 8:44 EST   Mobility   Mobility Grid   Supine to  Sit :   Rehab Minimal assistance   Scooting :   Rehab Minimal assistance   Transfer Sit to Stand :   Rehab Minimal assistance   Transfer Stand to Sit :   Rehab Minimal assistance   CARTER, PT, [place] G - 09/13/2016 8:44 EST   Ambulation Level :   Minimal contact assistance   Ambulation Quality :   slow, steady pace; Min A x 2.  Fatigued after approx 15', with slowing of pace and requesting to sit   Ambulation Distance :   15 ft   Device :   Rolling walker   [place], PT, [place] G - 09/13/2016 8:44 EST   Assessment   Discharge Recommendations :   may be appropriate for acute IP rehab     CARTER, PT, [place] G - 09/13/2016 8:44 EST   PT Treatment Recommendations :   Pt adm w/ concern for new CVA, workup still in progress and no H&P in the chart.  Per pt report and RN report, pt has a prior hx of CVA last August.  Pt was extremely drowsy this AM and made for challenging assessment of true level of orientation.  Pt needs  min A for mobility and close SBA sitting balance.  Min A to ambulate but limited in endurance.  Acute IP PT indicated and will need further skilled rehab at dc.  Acute IP rehab may be appropriate, will con't to adjust recs as appropriate.      [place], PT, [place] G - 09/13/2016 8:53 EST   Long Term Goals   PT LT Goals Reviewed :   Yes   CARTER, PT, [place] G - 09/13/2016 8:53 EST   Short Term Goals   Other PT Goals Grid     Goal #1  Goal #2  Goal #3  Goal #4    Goal :    supine-sit x CGA   sit-stand x CGA   amb x RW x CGA x 100'   good safety awareness with all mobility     Status :    Initial   Initial   Initial   Initial       CARTER, PT, [place] G - 09/13/2016 8:53 EST  CARTER, PT, [place] G - 09/13/2016 8:53 EST  CARTER, PT, [place] G - 09/13/2016 8:53 EST  CARTER, PT, [place] G - 09/13/2016 8:53 EST      PT ST Goals Reviewed :   Yes   CARTER, PT, [place] G - 09/13/2016 8:53 EST   Plan   PT Frequency Acute :   Daily   Duration :   7    PT Duration Unit Rehab :   Days   Treatments Planned :   Functional training, Gait training, Posture/Body mechanics training, Patient education, Therapeutic activities, Therapeutic exercises, Transfer training   Treatment Plan/Goals Established With Patient/Caregiver :   Yes   Evaluation Complete :   Yes   CARTER, PT, [place] G - 09/13/2016 8:53 EST   Time Spent With Patient   PT Evaluation Units, Low Complexity :   1 Unit   PT Time In :   8:15 EST   PT Time Out :   8:38 EST   PT Individual Eval Time, Low Complexity :   15 minutes   PT Therapeutic Exercise Units :   1 units   PT Therapeutic Exercise Time :   8 minutes   PT Total Individual Min :   15    PT Total Timed  Code Treatment Units :   1 units   PT Total Timed Code Min :   8    PT Total Untimed Min Ac/Outp :   15    PT Total Treatment Time Ac/Outp :   33    CARTER, PT, [place] G - 09/17/16 8:53 EST   PT G-Codes and Modifiers   Primary Functional Limitation Current Status (ref) :   Mobility   Mobility Current Status [phone]) :   CK  At least 40% but less than 60% impaired   Current Status Selection Method :   Used clinical judgment   Primary Functional Limitation Goal Status (ref) :   Mobility   Mobility Goal Status (G-8979) :   CJ At least 20% but less than 40% impaired   Goal Status Selection Method :   Used clinical judgment   CARTER, PT, [place] G - Sep 17, 2016 8:53 EST   Additional Information   Additional Information PT :   pt seated in chair and made comfortable.  left in care of OT to finish OT session     [place], PT, [place] G - 09-17-2016 8:53 EST

## 2016-09-13 NOTE — Progress Notes (Signed)
Inpatient PT Daily Documentation - Text       Inpatient PT Daily Documentation Entered On:  09/13/2016 11:39 EST    Performed On:  09/13/2016 11:35 EST by Montez Morita, PT, [place] G               Reason for Treatment   Subjective Statement :   1.  pt/RN agreed to PT session.  Pt up in chair upon arrival.      Chart review this AM reveals a PMH of HTN, Diabetes, and bipolar disorder as well as old lacunar infarct.     *Reason for Referral :   adm for possible CVA.    PT eval/tx     CARTER, PT, [place] G - 09/13/2016 11:35 EST   Review/Treatments Provided   PT Goals :   PT Short Term Goals    09/12/2016  Other PT Goal #1: supine-sit x CGA; Initial  Other PT Goal #2: sit-stand x CGA; Initial  Other PT Goal #3: amb x RW x CGA x 100'; Initial  Other PT Goal #4: good safety awareness with all mobility; Initial     PT Plan :   Treatment Duration: 7 Performed By: Tilden Dome  09/12/2016 08:44  Planned Treatments: Functional training, Gait training, Posture/Body mechanics training, Patient education, Therapeutic activities, Therapeutic exercises, Transfer training Performed By: Tilden Dome  09/12/2016 08:44     Physical Therapy Orders :   Physical Therapy Additional Treatment Acute - 09/13/16 8:57:12 EST, Functional training, Gait training, Posture/Body mechanics training, Patient education, Therapeutic activities, Therapeutic exercises, Transfer training, for 7 Days, Stop date 09/21/16 6:59:00 EST, Daily     Pain Present :   No actual or suspected pain   PT Therapeutic Activity,Mobility,Balance :   Yes   CARTER, PT, [place] G - 09/13/2016 11:35 EST   Therapeutic Activities/Mobility/Balance   PT Therapeutic Activities Grid     Activity 1          Activity :    Coordination, Functional mobility, Gait, Transfer training              Assist :    Minimal assistance              Comment :    sit>stand x min A/CGA.  Amb x 5' x RW and CGA.  Stand> sit to rest.  Sit> stand x min A; ambulated x RW x 10'.  Seated.  LE ther  ex reviewed 5 reps each.                 CARTER, PT, [place] G - 09/13/2016 11:35 EST         Assessment   Discharge Recommendations :   may be appropriate for acute IP rehab     PT Treatment Recommendations :   1.  Min A needed for transfers, ambulation; needs to have a chair close by to rest as endurance is limited.  Moves slowly but safely.  Will con't to benefit from IP PT for further gait training, endurance training, and strengthening as tolerated.  Rec AIRF at dc.      CARTER, PT, [place] G - 09/13/2016 11:35 EST   Time Spent With Patient   PT Time In :   11:10 EST   PT Time Out :   11:33 EST   PT Therapeutic Activity Units :   2 units   PT Therapeutic Activity Time :   23 minutes   PT Total Timed  Code Treatment Units :   2 units   PT Total Timed Code Min :   23    PT Total Treatment Time Ac/Outp :   49    CARTER, PT, [place] G - 09/13/2016 11:35 EST   Additional Information   Additional Information PT :   pt left seated in chair with LEs elevated and all needs in reach, tabs on.      [place], PT, [place] G - 09/13/2016 11:35 EST

## 2016-09-14 NOTE — Progress Notes (Signed)
Chaplaincy Note - Text       Chaplaincy Note Entered On:  09/14/2016 15:37 EST    Performed On:  09/14/2016 15:33 EST by Dortha SchwalbeOYNE,  HANNAH L               Chaplaincy Consult   Faith/Denomination :   Other: Holiness   Worshipping Community :   [employer], [address], [place]. Pt's church and pastor are aware of her hospitalization and she expects to see them soon (wintry weather has made travel on roads challenging).   Importance of Faith :   Very important   Religious Spiritual Resources :   Other: Local church support   Religious Spiritual Practices :   Prayer, Worship services   Religious Spiritual Concerns :   Other: Pt asked for prayer for her health and for her 6 children, who she said have been in some trouble lately. She said they are in their teens.   Religious Family Issues :   Per chart, pt is married and lives at home with her husband. She has 2 brothers, a sister, 4 sons, and 2 daughters.   Additional Information, Chaplaincy :   Pt request for pastoral visit. Per chart, pt has TIA. She had a hard time telling me the location of her church but after about a minute was able to tell me the name of the street. Pt was lying in bed and trying to find a TV channel that she wanted to    Dortha SchwalbeCOYNE,  HANNAH L - 09/14/2016 15:33 EST   Follow-Up   Chaplaincy Follow-Up Visit Notes :   watch. Assisted pt with using remote. Offered empathic listening and prayer support at bedside per pt's request. Let pt know that our team is available to her 24/7. She expects to see church pastor/friends soon.      Dortha SchwalbeCOYNE,  HANNAH L - 09/14/2016 15:33 EST

## 2016-09-14 NOTE — Nursing Note (Signed)
Adult Patient History Form-Text       Adult Patient History Entered On:  09/14/2016 14:08 EST    Performed On:  09/14/2016 14:08 EST by Ladona Ridgelaylor, RN, Petra KubaEmily C               General Info   Preferred Name :   Lindsay DandyMary   Accompanied By :   Spouse   Demonstrates Signs, Symptoms of the Following Condition :   Stroke   In Charge of News (ICON) Name :   Self   Primary Language :   English   Pregnancy Status :   Patient denies   Manus Gunningaylor, RN, Emily C - 09/14/2016 14:08 EST   Family History   Family History   (As Of: 09/14/2016 14:08:30 EST)     Immunizations   Influenza Vaccine Status :   Patient refused   Last Tetanus :   Less than 5 years   Manus Gunningaylor, RN, Emily C - 09/14/2016 14:08 EST   Advance Directive   *Advance Directive :   No   Patient Wishes to Receive Further Information on Advance Directives :   No   Manus Gunningaylor, RN, Emily C - 09/14/2016 14:08 EST

## 2016-09-14 NOTE — Progress Notes (Signed)
Inpatient PT Daily Documentation - Text       Inpatient PT Daily Documentation Entered On:  09/14/2016 14:06 EST    Performed On:  09/14/2016 12:00 EST by MICHAUD, PTA, ROSEMARY               Reason for Treatment   Subjective Statement :   #1.     *Reason for Referral :   adm for possible CVA.    PT eval/tx     MICHAUD, PTA, ROSEMARY - 09/14/2016 14:01 EST   Review/Treatments Provided   PT Goals :   PT Short Term Goals    09/12/2016  Other PT Goal #1: supine-sit x CGA; Initial  Other PT Goal #2: sit-stand x CGA; Initial  Other PT Goal #3: amb x RW x CGA x 100'; Initial  Other PT Goal #4: good safety awareness with all mobility; Initial     PT Plan :   Treatment Duration: 7 Performed By: Tilden DomeARTER, PT, [place] G  09/12/2016 08:44  Planned Treatments: Functional training, Gait training, Posture/Body mechanics training, Patient education, Therapeutic activities, Therapeutic exercises, Transfer training Performed By: Tilden DomeARTER, PT, [place] G  09/12/2016 08:44     Short Term Goals Reviewed :   Yes   Physical Therapy Orders :   Physical Therapy Additional Treatment Acute - 09/13/16 8:57:12 EST, Functional training, Gait training, Posture/Body mechanics training, Patient education, Therapeutic activities, Therapeutic exercises, Transfer training, for 7 Days, Stop date 09/21/16 6:59:00 EST, Daily     Pain Present :   No actual or suspected pain   PT Therapeutic Activity,Mobility,Balance :   Yes   MICHAUD, PTA, ROSEMARY - 09/14/2016 14:01 EST   Therapeutic Activities/Mobility/Balance   PT Therapeutic Activities Grid     Activity 1          Activity :    Coordination, Functional mobility, Gait, Transfer training              Assist :    Minimal assistance                MICHAUD, PTA, ROSEMARY - 09/14/2016 14:01 EST         Reassess Mobility :   Yes   MICHAUD, PTA, ROSEMARY - 09/14/2016 14:01 EST   Short Term Goals   Other PT Goals Grid     Goal #1  Goal #2  Goal #3  Goal #4    Goal :    supine-sit x CGA   sit-stand x CGA   amb x RW x CGA x  100'   good safety awareness with all mobility     Status :    Initial   Initial   Initial   Initial       MICHAUD, PTA, ROSEMARY - 09/14/2016 14:01 EST  MICHAUD, PTA, ROSEMARY - 09/14/2016 14:01 EST  MICHAUD, PTA, ROSEMARY - 09/14/2016 14:01 EST  MICHAUD, PTA, ROSEMARY - 09/14/2016 14:01 EST      PT ST Goals Reviewed :   Yes   MICHAUD, PTA, ROSEMARY - 09/14/2016 14:01 EST   Assessment   Discharge Recommendations :   may be appropriate for acute IP rehab     PT Treatment Recommendations :   #1. LETHARGIC, BUT FINALLY AGREES TO GET UP IN CHAIR. MOVING SLOWLY WITH SOME STOPS AND STARTS, SUPINE SIT MOD ASSIST TO COMPLETE. TOOK A FEW MOMENTS TO ACHIEVE STANDING BALANCE. SIT STAND AMBULATE 3' TO CHAIR, MIN HHA 2. FIRM CUEING FOR CORRECT RESEATING AND MADE COMFORTABLE. WILL  CONTINUE ATTEMPTS TO ADVANCE. APPEARS SHE WOULD BENEFIT FROM CONTINUED P.T. UPON DISCHARGE.      MICHAUD, PTA, ROSEMARY - 09/14/2016 14:01 EST   Time Spent With Patient   PT Evaluation Units, Low Complexity :   1 Unit   PT Therapeutic Activity Units :   1 units   PT Therapeutic Activity Time :   12 minutes   PT Total Timed Code Treatment Units :   1 units         CHAMBERS, OT, EMMA J - 09/17/2016 12:12 EST     PT Total Timed Code Min :   12    PT Total Treatment Time Ac/Outp :   12    MICHAUD, PTA, ROSEMARY - 09/14/2016 14:01 EST

## 2016-09-15 NOTE — Progress Notes (Signed)
Inpatient PT Daily Documentation - Text       Inpatient PT Daily Documentation Entered On:  09/15/2016 15:00 EST    Performed On:  09/15/2016 12:15 EST by MICHAUD, PTA, ROSEMARY               Reason for Treatment   Subjective Statement :   #2.     *Reason for Referral :   adm for possible CVA.    PT eval/tx     MICHAUD, PTA, ROSEMARY - 09/15/2016 14:56 EST   Review/Treatments Provided   PT Goals :   PT Short Term Goals    09/14/2016  Other PT Goal #1: supine-sit x CGA; Initial  Other PT Goal #2: sit-stand x CGA; Initial  Other PT Goal #3: amb x RW x CGA x 100'; Initial  Other PT Goal #4: good safety awareness with all mobility; Initial     PT Plan :   Treatment Duration: 7 Performed By: Tilden DomeARTER, PT, [place] G  09/12/2016 08:44  Planned Treatments: Functional training, Gait training, Posture/Body mechanics training, Patient education, Therapeutic activities, Therapeutic exercises, Transfer training Performed By: Tilden DomeARTER, PT, [place] G  09/12/2016 08:44     Short Term Goals Reviewed :   Yes   Physical Therapy Orders :   Physical Therapy Additional Treatment Acute - 09/13/16 8:57:12 EST, Functional training, Gait training, Posture/Body mechanics training, Patient education, Therapeutic activities, Therapeutic exercises, Transfer training, for 7 Days, Stop date 09/21/16 6:59:00 EST, Daily     Pain Present :   No actual or suspected pain   PT Therapeutic Activity,Mobility,Balance :   Yes   MICHAUD, PTA, ROSEMARY - 09/15/2016 14:56 EST   Therapeutic Activities/Mobility/Balance   PT Therapeutic Activities Grid     Activity 1          Activity :    Coordination, Functional mobility, Gait, Transfer training              Assist :    Minimal assistance                MICHAUD, PTA, ROSEMARY - 09/15/2016 14:56 EST         Reassess Mobility :   Yes   MICHAUD, PTA, ROSEMARY - 09/15/2016 14:56 EST   Short Term Goals   Other PT Goals Grid     Goal #1  Goal #2  Goal #3  Goal #4    Goal :    supine-sit x CGA   sit-stand x CGA   amb x RW x CGA x  100'   good safety awareness with all mobility     Status :    Initial   Initial   Initial   Initial       MICHAUD, PTA, ROSEMARY - 09/15/2016 14:56 EST  MICHAUD, PTA, ROSEMARY - 09/15/2016 14:56 EST  MICHAUD, PTA, ROSEMARY - 09/15/2016 14:56 EST  MICHAUD, PTA, ROSEMARY - 09/15/2016 14:56 EST      PT ST Goals Reviewed :   Yes   MICHAUD, PTA, ROSEMARY - 09/15/2016 14:56 EST   Assessment   Discharge Recommendations :   may be appropriate for acute IP rehab     PT Treatment Recommendations :   #2. CHAIR CLOSE. SUPINE SIT CG TO COMPLETE. STOOD UP IMMEDIATELY. AMBULATE 120' RW, MIN ASSIST, 1 OTHER FOR CHAIR, SOMEWHAT SLOW CADENCE, OCCASIONAL PHYSICAL CUE TO STEP INTO AND CONTROL WALKER MORE. FIRM CUEING FOR CORRECT RESEATING.  MAY BENEFIT FROM CONTINUED P.T. UPON DISCHARGE.  MICHAUD, PTA, ROSEMARY - 09/15/2016 14:56 EST   Time Spent With Patient   PT Gait Training Units :   1 units   PT Gait Training Time :   12 minutes   PT Total Timed Code Treatment Units :   1 units   PT Total Timed Code Min :   12    PT Total Treatment Time Ac/Outp :   12    MICHAUD, PTA, ROSEMARY - 09/15/2016 14:56 EST

## 2016-09-15 NOTE — Nursing Note (Signed)
PPD Reading - Text       PPD Reading Entered On:  09/15/2016 17:23 EST    Performed On:  09/15/2016 17:23 EST by Ladona Ridgelaylor, RN, Emily C               PPD Reading   PPD mm of Induration :   0 mm   PPD Interpretation :   Negative   Manus Gunningaylor, RN, Emily C - 09/15/2016 17:23 EST

## 2016-09-17 NOTE — Progress Notes (Signed)
Inpatient PT Daily Documentation - Text       Inpatient PT Daily Documentation Entered On:  09/17/2016 15:31 EST    Performed On:  09/17/2016 14:00 EST by Floyce StakesGAINES, PTA, NIKITA M               Reason for Treatment   Subjective Statement :   #4 Patient  awake seated in  bs chair upon arrival. Nrsg in agreement for treatment.     *Reason for Referral :   adm for possible CVA.    PT eval/tx     Ascencion DikeGAINES, PTA, NIKITA M - 09/17/2016 15:17 EST   Review/Treatments Provided   PT Goals :   PT Short Term Goals    09/15/2016  Other PT Goal #1: supine-sit x CGA; Initial  Other PT Goal #2: sit-stand x CGA; Initial  Other PT Goal #3: amb x RW x CGA x 100'; Initial  Other PT Goal #4: good safety awareness with all mobility; Initial     PT Plan :   Treatment Duration: 7 Performed By: Tilden DomeARTER, PT, [place] G  09/12/2016 08:44  Planned Treatments: Functional training, Gait training, Posture/Body mechanics training, Patient education, Therapeutic activities, Therapeutic exercises, Transfer training Performed By: Tilden DomeARTER, PT, [place] G  09/12/2016 08:44     Physical Therapy Orders :   Physical Therapy Additional Treatment Acute - 09/13/16 8:57:12 EST, Functional training, Gait training, Posture/Body mechanics training, Patient education, Therapeutic activities, Therapeutic exercises, Transfer training, for 7 Days, Stop date 09/21/16 6:59:00 EST, Daily     Pain Present :   No actual or suspected pain   PT Therapeutic Activity,Mobility,Balance :   Yes   Kerby LessGAINES, PTA, NIKITA M - 09/17/2016 15:17 EST   Therapeutic Activities/Mobility/Balance   PT Therapeutic Activities Grid     Activity 1          Activity :    Coordination, Functional mobility, Gait, Transfer training              Assist :    Minimal assistance              Equipment :    Rolling walker              Completed :    Yes                Kerby LessGAINES, PTA, [place] M - 09/17/2016 15:17 EST         Reassess Mobility :   Yes   Kerby LessGAINES, PTA, NIKITA M - 09/17/2016 15:17 EST   Assessment   PT Impairments or  Limitations :   Cognitive deficits   Discharge Recommendations :   may be appropriate for acute IP rehab     PT Treatment Recommendations :   Sit>stand with CGA; ambulated 80' with rolling walker +CGA-very slow paced cadence with  verbal cues to roll walker vs  constantly picking up to step-c/o fatigue-breathing heavy;seated -wheeled to steps in gym; rest break; after observing correct step negotiation sequence patient climbed  up/down with  cga using railings-required verbal and tactile cueing to perform correct/safe technique(as demonstrated ) ascending>descending  2/2 R LE weakness ??; continued  ambulating 160' extremely slow with no LOB or buckling- states she fears falling. Appeared SOB upon mininmal exertion prior to gait activity. Instructed patient in breathing technique to  conserve energy.  Lunch  tray  arriving. LE s elevated in chair. TABS ALARM on- needs nearby in reach.     Would benefit from some  form of rehab to  maximize safe (I) functional mobility.     Kerby Less, NIKITA M - 09/17/2016 15:17 EST   Time Spent With Patient   PT Time In :   13:05 EST   PT Time Out :   13:33 EST   PT Therapeutic Activity Units :   2 units   PT Therapeutic Activity Time :   28 minutes   PT Total Timed Code Treatment Units :   2 units   PT Total Timed Code Min :   28    PT Total Treatment Time Ac/Outp :   [address], PTA, NIKITA M - 09/17/2016 15:17 EST

## 2016-09-17 NOTE — Case Communication (Signed)
CM Discharge Planning Assessment - Text       CM Discharge Planning Ongoing Assessment Entered On:  09/17/2016 11:08 EST    Performed On:  09/17/2016 10:58 EST by Tamala SerSingleton,  Monique               Discharge Needs I   Previously Documented Discharge Needs :   DISCHARGE PLAN/NEEDS:  Discharge To, Anticipated: Home with family support Karolee Stamps- VARNER,  SARAH C - 09/12/16 08:38:00  Needs Assistance with Transportation: No Karolee Stamps- VARNER,  SARAH C - 09/12/16 08:38:00  EQUIPMENT/TREATMENT NEEDS:  Needs Assistance at Home Upon Discharge: Yes Karolee Stamps- VARNER,  SARAH C - 09/12/16 08:38:00  Professional Skilled Services: Occupational Therapy, Physical Therapy, Speech/Language Pathology - Karolee StampsVARNER,  SARAH C - 09/12/16 08:38:00       Previously Documented Benefits Information :   Performed By: Karolee StampsVARNER,  SARAH C  - 09/12/16 08:38:00       Discharge To :   Home with family support   Home Caregiver Name/Relationship :   Johann Capersjimmy [place]-- spouse   Home Caregiver Phone Number :   [phone]   CM Progress Note :   09/13/15 SCV: Pt was admitted with possible CVA.  Pt lives with her husband in one story home. Pt is on disability. Pt has had a previous stroke.  PT/OT/Speech ordered.  CM proposed PPD.  CM to follow up with further discharge needs. Pt has [employer] Medicare.     09/17/16 MS: Pt nurse Lillia AbedLindsay advised pt medically cleared for dc today and should return home with home health; however, pt CM unable to reach pt husband to discuss dispo plan and transportation home - telephone number is not able to take calls at this time. CM will continue to follow and asssit with dispo planning as needed. Pt nurse advised pt will need HH orders.      Tamala SerSingleton,  Monique - 09/17/2016 10:58 EST   Discharge Needs II   Professional Skilled Services :   Occupational Therapy, Physical Therapy, Speech/Language Pathology   Needs Assistance with Transportation :   No   Needs Assistance at Home Upon Discharge :   Yes   Discharge Planning Time Spent :   15 minutes   Tamala SerSingleton,  Monique -  09/17/2016 10:58 EST

## 2016-09-18 NOTE — Progress Notes (Signed)
 Inpatient PT Daily Documentation - Text       Inpatient PT Daily Documentation Entered On:  09/18/2016 16:12 EST    Performed On:  09/18/2016 14:35 EST by CHARLENE, PTA, NIKITA M               Reason for Treatment   Subjective Statement :   #5  I am going today.Lindsay Blackburn     CHARLENE HACKNEY, NIKITA M - 09/18/2016 16:12 EST   *Reason for Referral :   adm for possible CVA.    PT eval/tx     CHARLENE HACKNEY DANIELS M - 09/18/2016 16:11 EST   Review/Treatments Provided   PT Goals :   PT Short Term Goals    09/15/2016  Other PT Goal #1: supine-sit x CGA; Initial  Other PT Goal #2: sit-stand x CGA; Initial  Other PT Goal #3: amb x RW x CGA x 100'; Initial  Other PT Goal #4: good safety awareness with all mobility; Initial     PT Plan :   Treatment Duration: 7 Performed By: FRANCHOT, PT, [place]  G  09/12/2016 08:44  Planned Treatments: Functional training, Gait training, Posture/Body mechanics training, Patient education, Therapeutic activities, Therapeutic exercises, Transfer training Performed By: FRANCHOT, PT, [place]  G  09/12/2016 08:44     Physical Therapy Orders :   Physical Therapy Additional Treatment Acute - 09/13/16 8:57:12 EST, Functional training, Gait training, Posture/Body mechanics training, Patient education, Therapeutic activities, Therapeutic exercises, Transfer training, for 7 Days, Stop date 09/21/16 6:59:00 EST, Daily     Pain Present :   No actual or suspected pain   PT Therapeutic Activity,Mobility,Balance :   Yes   CHARLENE HACKNEY DANIELS CHRISTELLA - 09/18/2016 16:12 EST   Therapeutic Activities/Mobility/Balance   PT Therapeutic Activities Grid     Activity 1          Activity :    Coordination, Functional mobility, Gait, Transfer training              Assist :    Minimal assistance              Equipment :    Rolling walker              Completed :    Yes                CHARLENE HACKNEY DANIELS M - 09/18/2016 16:12 EST         Reassess Mobility :   Yes   CHARLENE HACKNEY, NIKITA M - 09/18/2016 16:12 EST   Assessment   PT Impairments or Limitations :    Cognitive deficits   Discharge Recommendations :   may be appropriate for acute IP rehab     PT Treatment Recommendations :   Sit>stand working on safe hand placement and static>dynamic standing balance; gait using rolling walker  short distance to  check walker height; no LOB or buckling.Reinforced safety issues and getting assistance as needed- patient states husband is on his way to pick her up for discharge.Will benefit from HHPT to continue rehab.     CHARLENE HACKNEY, NIKITA M - 09/18/2016 16:12 EST   Time Spent With Patient   PT Time In :   14:35 EST   PT Time Out :   14:59 EST   PT Therapeutic Activity Units :   2 units   PT Therapeutic Activity Time :   24 minutes   PT Total Timed Code Treatment Units :   2 units  PT Total Timed Code Min :   24    PT Total Treatment Time Ac/Outp :   [address] NIKITA M - 09/18/2016 16:12 EST

## 2016-09-18 NOTE — Nursing Note (Signed)
Nursing Discharge Summary - Text       Nursing Discharge Summary Entered On:  09/18/2016 15:46 EST    Performed On:  09/18/2016 15:44 EST by [facility], RN, Norvel RichardsANNE M               DC Information   Discharge To, Anticipated :   Home with home health   Devices/Equipment :   Data processing managerWalker - Rolling   Professional Skilled Services, Anticipated :   Occupational Therapy, Physical Therapy   Science writerpecial Services and Community Resources :   Mental Health - [place]   Mode of Discharge :   Wheelchair   Transportation :   Private vehicle   Accompanied By :   Ludwig LeanSpouse   WIRTH, RN, Norvel RichardsANNE M - 09/18/2016 15:44 EST   Education   Responsible Learner(s) :   Living Situation: Home with family support        Performed by: Tilden DomeARTER, PT,  G - 09/12/2016 08:44  Discharge To: Home with family support        Performed by: Tamala SerSingleton,  Monique - 09/18/2016 09:07  Home Caregiver Name/Relationship: Johann Capersjimmy Calleros-- spouse        Performed by: Tamala SerSingleton,  Monique - 09/18/2016 09:07  Home Caregiver Phone Number: [phone]        Performed by: Tamala SerSingleton,  Monique - 09/18/2016 09:07     Home Caregiver Present for Session :   Yes   Barriers To Learning :   Cognitive deficits   Teaching Method :   Explanation, Printed materials, Teach-back   [place], RN, Norvel RichardsNNE M - 09/18/2016 15:44 EST   Post-Hospital Education Adult Grid   Activity Expectations :   Trenton GammonVerbalizes understanding   Equipment/Devices :   TEFL teacherVerbalizes understanding   Importance of Follow-Up Visits :   Verbalizes understanding   Physical Limitations :   Verbalizes understanding   When to Call Health Care Provider :   [employer] SquibbVerbalizes understanding   [facility], RN, Norvel RichardsNNE M - 09/18/2016 15:44 EST   Medication Education Adult Grid   Med Dosage, Route, Scheduling :   IT sales professionalVerbalizes understanding   Safety, Medication :   Verbalizes understanding   WIRTH, RN, Norvel RichardsNNE M - 09/18/2016 15:44 EST   Additional Learner(s) Present :   Spouse   Time Spent Educating Patient :   15 minutes   WIRTH, RN, Norvel RichardsNNE M - 09/18/2016 15:44 EST

## 2016-09-18 NOTE — Case Communication (Signed)
CM Discharge Planning Assessment - Text       CM Discharge Planning Ongoing Assessment Entered On:  09/18/2016 9:15 EST    Performed On:  09/18/2016 9:07 EST by Tamala SerSingleton,  Monique               Discharge Needs I   Previously Documented Discharge Needs :   DISCHARGE PLAN/NEEDS:  Discharge To, Anticipated: Home with family support Tamala Ser- Singleton,  Monique - 09/18/16 08:54:00  Needs Assistance with Transportation: No Tamala Ser- Singleton,  Monique - 09/18/16 08:54:00  EQUIPMENT/TREATMENT NEEDS:  Needs Assistance at Home Upon Discharge: Yes Tamala Ser- Singleton,  Monique - 09/18/16 08:54:00  Professional Skilled Services: Occupational Therapy, Physical Therapy - Tamala SerSingleton,  Monique - 09/18/16 08:54:00       Previously Documented Benefits Information :   Performed By: Karolee StampsVARNER,  SARAH C  - 09/12/16 08:38:00       Anticipated Discharge Date :   09/18/2016 EST   Discharge To :   Home with family support   Home Caregiver Name/Relationship :   Johann Capersjimmy Leu-- spouse   Home Caregiver Phone Number :   [phone]   CM Progress Note :   09/13/15 SCV: Pt was admitted with possible CVA.  Pt lives with her husband in one story home. Pt is on disability. Pt has had a previous stroke.  PT/OT/Speech ordered.  CM proposed PPD.  CM to follow up with further discharge needs. Pt has [employer] Medicare.     09/17/16 MS: Pt nurse Lillia AbedLindsay advised pt medically cleared for dc today and should return home with home health; however, pt CM unable to reach pt husband to discuss dispo plan and transportation home - telephone number is not able to take calls at this time. CM will continue to follow and asssit with dispo planning as needed. Pt nurse advised pt will need HH orders.   09/17/16 Late entry MS: CM met with pt, nurse Lillia AbedLindsay, daughter April and pt husband regarding dispo plan and symptoms. Pt scheduled for dc; however, nurse attempted to contact attending regarding symptoms presented. CM attempted to wait for return call from doctor to discuss findings, to no avail. Doctor  advised nurse pt would need to be dc T-1, but pt in need of HH and RW of which neither was ordered by attending. Pt nurse presented IM letter to pt and husband, signed and placed on the chart in CM absence. Pt family initially advised they did not want to appeal the process but later changed their minds and requested to appeal dc.     09/18/16 MS: Pt medically cleared for dc today and has a RW. HH [facility]. Pt will go to her SIL home at dc [address], [place] [id number]. Pt husband will provide transportation. Pt dtr at bedside. Pt dtr advised [facility]. No other CM needs noted, per nurse Thurston HoleAnne or pt family.      Tamala SerSingleton,  Monique - 09/18/2016 9:07 EST   Discharge Needs II   Home Equipment Rehab :   Therapist, musicWalker - [employer]   Professional Skilled Services :   Occupational Therapy, Physical Therapy   Needs Assistance with Transportation :   No   Needs Assistance at Home Upon Discharge :   Yes   Discharge Planning Time Spent :   15 minutes   Tamala SerSingleton,  Monique - 09/18/2016 9:07 EST   Discharge Planning  Discharge Arrangements :       Patient Post-Acute Information    Patient Name: Lindsay Blackburn, Lindsay Blackburn  MRN: [id number]  FIN: [id number]  Gender: Female  DOB: [birth year]  Age:  56 Years        *** No Post-Acute Placement(s) Listed ***          *** No Post-Acute Service(s) Listed Nurse, mental health Hospital Services :   [facility] will provide therapy in your home within 48hrs of discharge. Pleae contact them direclty with questions at [phone].     Discharge Options Discussed with Patient :   DME, Discharge transportation, Home Health, Other: psychiatric outpt tx   Discharge Plan Discussion :   Discussed with patient, Discussed with family/caregiver, Patient agrees with plan, Family agrees with plan   Discharge Medicare Message Date/Time :   09/17/2016 18:21 EST   Tamala Ser - 09/18/2016 9:07 EST

## 2016-09-18 NOTE — Progress Notes (Signed)
Chaplaincy Note - Text       Chaplaincy Note Entered On:  09/18/2016 15:34 EST    Performed On:  09/18/2016 14:45 EST by Jena GaussMAXWELL,  LEON               Chaplaincy Consult   Faith/Denomination :   Other: Holiness   Jena GaussMAXWELL,  LEON - 09/18/2016 15:31 EST   Follow-Up   Chaplaincy Follow-Up Visit Notes :   F/U visit with Pt (husband was present), Pt was dressed and waiting for her discharge instructions. The Pt was in good spirit and stated that she felt much better and looking forward to going home. She was appreciative of the visits and ended the visit with words of encouragement.     MAXWELL,  LEON - 09/18/2016 15:31 EST
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
## 2016-09-18 NOTE — Discharge Summary (Signed)
Inpatient Patient Summary               [facility]  [address]  [place], [place] [address]  [phone]  Patient Discharge Instructions    Name: Lindsay Blackburn, Lindsay Blackburn  Current Date: 09/18/2016 12:02:56  DOB: July 17, 1961 MRN: [id number] FIN: NBR%>[phone]  Patient Address: [address] [place] [address]  Patient Phone: [phone]  Primary Care Provider:  Name: Tyrone Apple  Phone: [phone]   Immunizations Provided:        Immunization(s) Given This Visit   Name Date   influenza virus vaccine, inactivated 09/12/16 18:36:00   tuberculin purified protein derivative 09/13/16 15:17:00          Discharge Diagnosis: 1:Transient ischemic attack (TIA); 2:Bipolar disorder; 3:Diabetes; 4:Hypertension; 5:Metabolic acidosis; 6:Dyspnea; Anxiety  Discharged To: TO, ANTICIPATED%>Home with family support  Home Treatments: TREATMENTS, ANTICIPATED%>  Devices/Equipment: EQUIPMENT REHAB%>Walker - [facility]  Post Hospital Services: HOSPITAL SERVICES%>[facility] will provide therapy in your home within 48hrs of discharge. Pleae contact them direclty with questions at [phone].      Professional Skilled Services: SKILLED SERVICES%>  Therapist, sports and Community Resources: SERV AND COMM RES, ANTICIPATED%>  Mode of Discharge Transportation: TRANSPORTATION%>  Discharge Orders         Discharge Patient 09/17/16 9:45:00 EST        Comment:     Medications   During the course of your visit, your medication list was updated with the most current information. The details of those changes are reflected below:         Medications That Were Updated - Follow Current Instructions  Other Medications  Current: haloperidol (Haldol) 2 Milligram Oral (given by mouth) Once a Day (at bedtime).  Last Dose:____________________    Medications That Have Not Changed  Other Medications  amLODIPine (amLODIPine 10 mg oral tablet) 1 Tabs Oral (given by mouth) every day.  Last Dose:____________________  aspirin (aspirin 81 mg oral tablet)  1 Tabs Oral (given by mouth) every day. Refills: 4.  Last Dose:____________________  aspirin (aspirin 81 mg oral tablet) 1 Tabs Oral (given by mouth) every day. Refills: 0.  Last Dose:____________________  atorvastatin (atorvastatin 10 mg oral tablet) 1 Tabs Oral (given by mouth) every day.  Last Dose:____________________  benztropine (benztropine 1 mg oral tablet) 1 Tabs Oral (given by mouth) 2 times a day.  Last Dose:____________________  divalproex sodium (divalproex sodium 500 mg oral tablet, extended release) 2 Tabs Oral (given by mouth) Once a Day (at bedtime).  Last Dose:____________________  labetalol (labetalol 200 mg oral tablet) 1 Tabs Oral (given by mouth) 2 times a day.  Last Dose:____________________  metFORMIN (metFORMIN 500 mg oral tablet) 1 Tabs Oral (given by mouth) 2 times a day.  Last Dose:____________________  pioglitazone (pioglitazone 30 mg oral tablet) 1 Tabs Oral (given by mouth) every day.  Last Dose:____________________  These Medications Were Removed and Should No Longer Be Taken  famotidine (famotidine 20 mg oral tablet) 1 Tabs Oral (given by mouth) 2 times a day.  Stop Taking Reason: Nurse Request  mupirocin topical (mupirocin 2% topical ointment) 1 Application Topical (on the skin) 3 times a day.  Stop Taking Reason:         [facility] would like to thank you for allowing [place] to assist you with your healthcare needs. The following includes patient education materials and information regarding your injury/illness.    Portman, Brunilda L has been given the following list of follow-up instructions,  prescriptions, and patient education materials:  Follow-up Instructions:             With: Address: When:   Tyrone Apple [address]  [place], [place]  [address]  [phone] In 1 week   Comments:   Call for followup appointment       With: Address: When:   [facility] [address] [place], [place] 09/19/2016 09:00:00                 It is important to  always keep an active list of medications available so that you can share with other providers and manage your medications appropriately. As an additional courtesy, we are also providing you with your final active medications list that you can keep with you.          amLODIPine (amLODIPine 10 mg oral tablet) 1 Tabs Oral (given by mouth) every day.  aspirin (aspirin 81 mg oral tablet) 1 Tabs Oral (given by mouth) every day. Refills: 4.  aspirin (aspirin 81 mg oral tablet) 1 Tabs Oral (given by mouth) every day. Refills: 0.  atorvastatin (atorvastatin 10 mg oral tablet) 1 Tabs Oral (given by mouth) every day.  benztropine (benztropine 1 mg oral tablet) 1 Tabs Oral (given by mouth) 2 times a day.  divalproex sodium (divalproex sodium 500 mg oral tablet, extended release) 2 Tabs Oral (given by mouth) Once a Day (at bedtime).  haloperidol (Haldol) 2 Milligram Oral (given by mouth) Once a Day (at bedtime).  labetalol (labetalol 200 mg oral tablet) 1 Tabs Oral (given by mouth) 2 times a day.  metFORMIN (metFORMIN 500 mg oral tablet) 1 Tabs Oral (given by mouth) 2 times a day.  pioglitazone (pioglitazone 30 mg oral tablet) 1 Tabs Oral (given by mouth) every day.      Take only the medications listed above. Contact your doctor prior to taking any medications not on this list.      Discharge instructions, if any, will display below    Instructions for Diet: INSTRUCTIONS FOR DIET%>Consistent carbohydrate/Diabetic diet   Instructions for Supplements: SUPPLEMENT INSTRUCTIONS%>   Instructions for Activity: INSTRUCTIONS FOR ACTIVITY%>As Tolerated, Use walker at all times   Instructions for Wound Care: INSTRUCTIONS FOR WOUND CARE%>    Medication leaflets, if any, will display below     Patient education materials, if any, will display below        Discharge Instructions for Transient Ischemic Attack (TIA)   You have been diagnosed with a transient ischemic attack (TIA). You can think of a TIA as a temporary or mini-stroke. Blood  temporarily could not reach part of your brain. Unlike a stroke, TIAs usually cause no lasting damage. If you think you are having symptoms of a TIA or stroke, get medical help right away--even if the symptoms go away.    Prevention    Take your medications exactly as directed. Dont skip doses.    Learn to take your blood pressure. Keep a log for your doctor.    Change your diet if your doctor tells you to. Your doctor may suggest that you cut back on salt. If so, here are some tips:    Limit canned, dried, packaged, and fast foods.    Dont add salt to your food at the table.    Season foods with herbs instead of salt when you cook.    Maintain a healthy weight. Get help to lose any extra pounds.  Begin an exercise program. Ask your doctor how to get started. You can benefit from simple activities, such as walking or gardening.    Limit your alcohol intake to no more than 2 drinks a day.    Know your cholesterol level. Follow your doctors advice about how to keep cholesterol under control.    If you are a smoker, you need to quit now. Enroll in a stop-smoking program to improve your chances of success. Ask your doctor about medications or other methods to help you quit.    Your health care provider will give you information on dietary changes that you may need to make, based on your situation. Your provider may recommend that you see a registered dietitian for help with diet changes. Changes may include:    Reducing fat and cholesterol intake    Reducing sodium (salt) intake, especially if you have high blood pressure    Increasing your intake of fresh vegetables and fruits    Eating lean proteins, such as fish, poultry, and legumes (beans and peas) and eating less red meat and processed meats    Using low-fat dairy products    Using vegetable and nut oils in limited amounts    Limiting sweets and processed foods such as chips, cookies, and baked goods    If you are overweight, your health care  provider will work with you to lose weight and lower your body mass index (BMI) to a normal or near-normal level. Making diet changes and increasing physical activity can help.    Begin an exercise program. Ask your doctor how to get started and how much activity you should try to get on a daily or weekly basis. You can benefit from simple activities such as walking or gardening.    Learn stress-management techniques to help you deal with stress in your home and work life.   Follow-up care    Some medications require blood tests to check for progress or problems. Keep follow-up appointments for any blood tests ordered by your doctors.       When to seek medical care   Call 911 right away if you have any of the following:    Weakness, tingling, or loss of feeling on one side of your face or body    Sudden double vision, or trouble seeing in one or both eyes    Sudden trouble talking, or slurring your speech    Trouble understanding others    Sudden, severe headache    Dizziness, loss of balance, or a spinning feeling, a sense of falling    Blackouts or seizures      2000-2017 [employer] [address] [place], [place] [address]. All rights reserved. This information is not intended as a substitute for professional medical care. Always follow your healthcare professional's instructions.           IS IT A STROKE? Act FAST and Check for these signs:    FACE                         Does the face look uneven?    ARM                         Does one arm drift down?    SPEECH                    Does their speech sound  strange?    TIME                         Call 9-1-1 at any sign of stroke  Heart Attack Signs  Chest discomfort: Most heart attacks involve discomfort in the center of the chest and lasts more than a few minutes, or goes away and comes back. It can feel like uncomfortable pressure, squeezing, fullness or pain.  Discomfort in upper body: Symptoms can include pain or discomfort in one  or both arms, back, neck, jaw or stomach.  Shortness of breath: With or without discomfort.  Other signs: Breaking out in a cold sweat, nausea, or lightheaded.  Remember, MINUTES DO MATTER. If you experience any of these heart attack warning signs, call 9-1-1 to get immediate medical attention!     ---------------------------------------------------------------------------------------------------------------------  [facility] allows you to manage your health, view your test results, and retrieve your discharge documents from your hospital stay securely and conveniently from your computer.  To begin the enrollment process, visit [email or web address] Click on "Sign up now" under [facility].   Yes - Patient/Family/Caregiver demonstrates understanding of instructions given        ______________________________                                 ___________________    Patient/Family/ Caregiver Signature                                                      Date/Time     ______________________________                                 ___________________    Provider Signature                                                                                        Date/Time

## 2016-09-18 NOTE — Nursing Note (Signed)
Nursing Discharge Summary - Text       Physician Discharge Summary Entered On:  09/18/2016 9:44 EST    Performed On:  09/18/2016 9:43 EST by [facility], RN, Norvel RichardsANNE M               DC Information   Provider Instructions for Diet :   Consistent carbohydrate/Diabetic diet   Provider Instructions for Activity :   As Tolerated, Use walker at all times   [facility], RN, Norvel RichardsNNE M - 09/18/2016 9:43 EST

## 2016-09-18 NOTE — Case Communication (Signed)
CM Discharge Planning Assessment - Text       CM Discharge Planning Ongoing Assessment Entered On:  09/18/2016 9:07 EST    Performed On:  09/18/2016 8:54 EST by Tamala SerSingleton,  Monique               Discharge Needs I   Previously Documented Discharge Needs :   DISCHARGE PLAN/NEEDS:  Discharge To, Anticipated: Home with family support - Tamala SerSingleton,  Monique - 09/17/16 10:58:00  Needs Assistance with Transportation: No Tamala Ser- Singleton,  Monique - 09/17/16 10:58:00  EQUIPMENT/TREATMENT NEEDS:  Needs Assistance at Home Upon Discharge: Yes Tamala Ser- Singleton,  Monique - 09/17/16 10:58:00  Professional Skilled Services: Occupational Therapy, Physical Therapy, Speech/Language Pathology - Tamala SerSingleton,  Monique - 09/17/16 10:58:00       Previously Documented Benefits Information :   Performed By: Karolee StampsVARNER,  SARAH C  - 09/12/16 08:38:00       Discharge To :   Home with family support   Home Caregiver Name/Relationship :   Johann Capersjimmy Upperman-- spouse   Home Caregiver Phone Number :   [phone]   CM Progress Note :   09/13/15 SCV: Pt was admitted with possible CVA.  Pt lives with her husband in one story home. Pt is on disability. Pt has had a previous stroke.  PT/OT/Speech ordered.  CM proposed PPD.  CM to follow up with further discharge needs. Pt has [employer] Medicare.     09/17/16 MS: Pt nurse Lillia AbedLindsay advised pt medically cleared for dc today and should return home with home health; however, pt CM unable to reach pt husband to discuss dispo plan and transportation home - telephone number is not able to take calls at this time. CM will continue to follow and asssit with dispo planning as needed. Pt nurse advised pt will need HH orders.   09/17/16 Late entry MS: CM met with pt, nurse Lillia AbedLindsay, daughter April and pt husband regarding dispo plan and symptoms. Pt scheduled for dc; however, nurse attempted to contact attending regarding symptoms presented. CM attempted to wait for return call from doctor to discuss findings, to no avail. Doctor advised nurse pt  would need to be dc T-1, but pt in need of HH and RW of which neither was ordered by attending. Pt nurse presented IM letter to pt and husband, signed and placed on the chart in CM absence. Pt family initially advised they did not want to appeal the process but later changed their minds and requested to appeal dc.      Tamala SerSingleton,  Monique - 09/18/2016 8:54 EST   Discharge Needs II   Home Equipment Rehab :   Therapist, musicWalker - [employer]   Professional Skilled Services :   Occupational Therapy, Physical Therapy   Needs Assistance with Transportation :   No   Needs Assistance at Home Upon Discharge :   Yes   Discharge Planning Time Spent :   65 minutes   Tamala SerSingleton,  Monique - 09/18/2016 8:54 EST   Discharge Planning   Discharge Arrangements :       Patient Post-Acute Information    Patient Name: Lindsay Blackburn, Lindsay Blackburn  MRN: [id number]  FIN: [id number]  Gender: Female  DOB: 2061/08/16  Age:  [age] Years        *** No Post-Acute Placement(s) Listed ***          *** No Post-Acute Service(s) Listed ***       Discharge Options Discussed with Patient :   DME, Discharge transportation, Home Health,  Other: psychiatric outpt tx   Discharge Plan Discussion :   Discussed with patient, Discussed with family/caregiver, Patient agrees with plan, Family agrees with plan   Discharge Medicare Message Date/Time :   09/17/2016 18:21 EST   Tamala Ser - 09/18/2016 8:54 EST

## 2016-09-18 NOTE — Discharge Summary (Signed)
Inpatient Clinical Summary             [facility]  Post-Acute Care Transfer Instructions  PERSON INFORMATION   Name: Lindsay Blackburn, Lindsay Blackburn   MRN: [id number]    FIN#: [id number]   PHYSICIANS  Admitting Physician: Thurston Pounds  Attending Physician: Thurston Pounds   PCP: Tyrone Apple  Discharge Diagnosis: 1:Transient ischemic attack (TIA); 2:Bipolar disorder; 3:Diabetes; 4:Hypertension; 5:Metabolic acidosis; 6:Dyspnea; Anxiety  Comment:       PATIENT EDUCATION INFORMATION  Instructions:             Transient Ischemic Attack (TIA), Discharge Instructions  Medication Leaflets:               Follow-up:                          With: Address: When:   Tyrone Apple [address]  [place], [place]  [address]  [phone] In 1 week   Comments:   Call for followup appointment       With: Address: When:   [facility] [address] [place], [place] 09/19/2016 09:00:00                           MEDICATION LIST  Medication Reconciliation at Discharge:         Medications That Were Updated - Follow Current Instructions  Other Medications  Current: haloperidol (Haldol) 2 Milligram Oral (given by mouth) Once a Day (at bedtime).  Last Dose:____________________    Medications That Have Not Changed  Other Medications  amLODIPine (amLODIPine 10 mg oral tablet) 1 Tabs Oral (given by mouth) every day.  Last Dose:____________________  aspirin (aspirin 81 mg oral tablet) 1 Tabs Oral (given by mouth) every day. Refills: 4.  Last Dose:____________________  aspirin (aspirin 81 mg oral tablet) 1 Tabs Oral (given by mouth) every day. Refills: 0.  Last Dose:____________________  atorvastatin (atorvastatin 10 mg oral tablet) 1 Tabs Oral (given by mouth) every day.  Last Dose:____________________  benztropine (benztropine 1 mg oral tablet) 1 Tabs Oral (given by mouth) 2 times a day.  Last Dose:____________________  divalproex sodium (divalproex sodium 500 mg oral tablet, extended release) 2  Tabs Oral (given by mouth) Once a Day (at bedtime).  Last Dose:____________________  labetalol (labetalol 200 mg oral tablet) 1 Tabs Oral (given by mouth) 2 times a day.  Last Dose:____________________  metFORMIN (metFORMIN 500 mg oral tablet) 1 Tabs Oral (given by mouth) 2 times a day.  Last Dose:____________________  pioglitazone (pioglitazone 30 mg oral tablet) 1 Tabs Oral (given by mouth) every day.  Last Dose:____________________  These Medications Were Removed and Should No Longer Be Taken  famotidine (famotidine 20 mg oral tablet) 1 Tabs Oral (given by mouth) 2 times a day.  Stop Taking Reason: Nurse Request  mupirocin topical (mupirocin 2% topical ointment) 1 Application Topical (on the skin) 3 times a day.  Stop Taking Reason:          Patient's Final Home Medication List Upon Discharge:          amLODIPine (amLODIPine 10 mg oral tablet) 1 Tabs Oral (given by mouth) every day.  aspirin (aspirin 81 mg oral tablet) 1 Tabs Oral (given by mouth) every day. Refills: 4.  aspirin (aspirin 81 mg oral tablet) 1 Tabs Oral (given by mouth) every day. Refills: 0.  atorvastatin (atorvastatin 10 mg oral tablet) 1 Tabs Oral (given by mouth) every day.  benztropine (benztropine 1 mg oral tablet) 1 Tabs Oral (given by mouth) 2 times a day.  divalproex sodium (divalproex sodium 500 mg oral tablet, extended release) 2 Tabs Oral (given by mouth) Once a Day (at bedtime).  haloperidol (Haldol) 2 Milligram Oral (given by mouth) Once a Day (at bedtime).  labetalol (labetalol 200 mg oral tablet) 1 Tabs Oral (given by mouth) 2 times a day.  metFORMIN (metFORMIN 500 mg oral tablet) 1 Tabs Oral (given by mouth) 2 times a day.  pioglitazone (pioglitazone 30 mg oral tablet) 1 Tabs Oral (given by mouth) every day.         Comment:       ORDERS         Order Name Order Details   Discharge Patient 09/17/16 9:45:00 EST

## 2017-01-15 ENCOUNTER — Telehealth (HOSPITAL_COMMUNITY): Payer: Self-pay | Admitting: *Deleted

## 2017-01-15 ENCOUNTER — Encounter (HOSPITAL_COMMUNITY): Payer: Self-pay | Admitting: Psychiatry

## 2017-01-15 ENCOUNTER — Ambulatory Visit (INDEPENDENT_AMBULATORY_CARE_PROVIDER_SITE_OTHER): Payer: Medicare HMO | Admitting: Psychiatry

## 2017-01-15 VITALS — BP 130/80 | HR 96 | Resp 18 | Ht 65.0 in | Wt 230.0 lb

## 2017-01-15 DIAGNOSIS — Z79899 Other long term (current) drug therapy: Secondary | ICD-10-CM | POA: Diagnosis not present

## 2017-01-15 DIAGNOSIS — F319 Bipolar disorder, unspecified: Secondary | ICD-10-CM

## 2017-01-15 DIAGNOSIS — F419 Anxiety disorder, unspecified: Secondary | ICD-10-CM

## 2017-01-15 DIAGNOSIS — E119 Type 2 diabetes mellitus without complications: Secondary | ICD-10-CM | POA: Insufficient documentation

## 2017-01-15 DIAGNOSIS — I10 Essential (primary) hypertension: Secondary | ICD-10-CM | POA: Insufficient documentation

## 2017-01-15 DIAGNOSIS — G4700 Insomnia, unspecified: Secondary | ICD-10-CM

## 2017-01-15 NOTE — Telephone Encounter (Signed)
Pt will need new orders sent to lab for CBC and Pneumonia. Per Lab, pt was dehydrated when labs were performed.

## 2017-01-15 NOTE — Progress Notes (Signed)
Psychiatric Initial Adult Assessment   Patient Identification: Heather FolkMary Marquez MRN:  [id number] Date of Evaluation:  01/15/2017 [facility] Chief Complaint:   Chief Complaint    Establish Care     Visit Diagnosis:    ICD-9-CM ICD-10-CM   1. Bipolar I disorder (HCC) 296.7 F31.9 Valproic Acid level     Ammonia     CBC with Differential  2. Essential hypertension 401.9 I10     History of Present Illness:  56 years old currently married African-American female [facility] for [facility] since she has relocated.  Patient diagnoses bipolar she's currently on disability since 2000 she's had manic episodes in the past it is difficult for her to describe that she cut somewhat teary and upset says that she would get hyped up and would sing louder and be extra joyous and risky. Had been admitted  in the hospital because of a manic episodes rather than depression in the past.   Does endorse episodes of depression which she becomes withdrawn and disturbed sleep energy but most concerning according to the history given is manic episodes.  As of now she does not worry excessively she does not endorse depressive symptoms she feels medication is at balance she is on Depakote 1000 mg at night. She has stopped taking Haldol 3 months ago she was having some tongue movements and tremors she still has some jaw movements but they noticed that it is getting better her primary care doctor or the last writer has asked her to stop taking Haldol it is started 1 year ago when she was admitted in the hospital She does not feel any more depressed or she does not feel any different without Haldol. She is not taking Cogentin either.  Aggravating factor: non compliance in the past  Modifying factor: husband Associated Signs/Symptoms: Depression Symptoms:  fatigue, anxiety, disturbed sleep, (Hypo) Manic Symptoms:  Distractibility, Anxiety  Symptoms:  denies Psychotic Symptoms:  denies PTSD Symptoms: NA  Past Psychiatric History: bipolar, manic as per admission history  Previous Psychotropic Medications: Yes   Substance Abuse History in the last 12 months:  No.  Consequences of Substance Abuse: NA  Past Medical History: History reviewed. No pertinent past medical history. History reviewed. No pertinent surgical history.  Family Psychiatric History: denies   Family History: History reviewed. No pertinent family history.  Social History:   Social History   Social History  . Marital status: Married    Spouse name: N/A  . Number of children: N/A  . Years of education: N/A   Social History Main Topics  . Smoking status: Never Smoker  . Smokeless tobacco: Never Used  . Alcohol use No  . Drug use: No  . Sexual activity: Not Asked   Other Topics Concern  . None   Social History Narrative  . None    Additional Social History: grew up with 5 siblings. Single mom family home. 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  Married 21 years. 6 grown kids   Allergies:  No Known Allergies  Metabolic Disorder Labs: No results found for: HGBA1C, MPG No results found for: PROLACTIN No results found for: CHOL, TRIG, HDL, CHOLHDL, VLDL, LDLCALC   Current Medications: Current Outpatient Prescriptions  Medication Sig Dispense Refill  . amLODipine (NORVASC) 10 MG tablet Take by mouth.    Marland Kitchen. atorvastatin (LIPITOR) 10 MG tablet Take by mouth.    . divalproex (DEPAKOTE) 500 MG DR tablet Take  by mouth.    . labetalol (NORMODYNE) 200 MG tablet Take by mouth.    . metFORMIN (GLUCOPHAGE) 500 MG tablet Take by mouth.    . pioglitazone (ACTOS) 30 MG tablet Take by mouth.    . valsartan (DIOVAN) 80 MG tablet Take by mouth.     No current facility-administered medications for this visit.     Neurologic: Headache: No Seizure: No Paresthesias:No  Musculoskeletal: Strength & Muscle Tone: within normal  limits Gait & Station: normal Patient leans: no lean  Psychiatric Specialty Exam: Review of Systems  Cardiovascular: Negative for chest pain.  Musculoskeletal: Negative for neck pain.  Skin: Negative for rash.  Neurological: Positive for tremors.  Psychiatric/Behavioral: Negative for depression.    Blood pressure 130/80, pulse 96, resp. rate 18, height 5\' 5"  (1.651 m), weight 230 lb (104.3 kg), SpO2 97 %.Body mass index is 38.27 kg/m.  General Appearance: Casual  Eye Contact:  Fair  Speech:  Slow  Volume:  Decreased  Mood:  Euthymic  Affect:  Blunt and Congruent  Thought Process:  Goal Directed  Orientation:  Full (Time, Place, and Person)  Thought Content:  Rumination  Suicidal Thoughts:  No  Homicidal Thoughts:  No  Memory:  Immediate;   Fair Recent;   Fair  Judgement:  Fair  Insight:  Fair  Psychomotor Activity:  Decreased  Concentration:  Concentration: Fair and Attention Span: Fair  Recall:  [employer] of Knowledge:Fair  Language: Fair  Akathisia:  Yes  Handed:  Right  AIMS (if indicated):  Minimal distress  Assets:  Desire for Improvement Social Support  ADL's:  Intact  Cognition: WNL  Sleep:  fair    Treatment Plan Summary: Medication management and Plan as follows   1. Bipolar per history and having manic episodes. : stable mood as of now. Denies depression . In touch with reality Wants to continue depakote 1000mg . Has meds for now. Will send for labs Not on haldol now. Does not want to be on any mood stabilzer or add any further psychotropic depakote level, cbc, ammonia level  2. Anxiety: not excessive. Does not want to be on ativan or SSRI . Gave option as it may help the tremors and underlying anxiety   3. Sleep is adequate Denies drug or alcohol use  More than 50% time spent in counseling and coordination of care including patient education and review of side effects Has a stable supportive marriage.   FU 1 month. If no further change in meds  done can be seen 3 monthly after that.   Thresa Ross, MD 5/8/20181:46 PM

## 2017-01-16 LAB — CBC WITH DIFFERENTIAL/PLATELET

## 2017-01-16 LAB — AMMONIA

## 2017-01-16 LAB — VALPROIC ACID LEVEL: Valproic Acid Lvl: 85.6 ug/mL (ref 50.0–100.0)

## 2017-01-16 NOTE — Telephone Encounter (Signed)
Labs request printed

## 2017-02-11 ENCOUNTER — Encounter (HOSPITAL_COMMUNITY): Payer: Self-pay | Admitting: Psychiatry

## 2017-02-11 ENCOUNTER — Ambulatory Visit (INDEPENDENT_AMBULATORY_CARE_PROVIDER_SITE_OTHER): Payer: Medicare HMO | Admitting: Psychiatry

## 2017-02-11 VITALS — BP 128/80 | HR 85 | Resp 16 | Ht 65.0 in | Wt 235.0 lb

## 2017-02-11 DIAGNOSIS — R251 Tremor, unspecified: Secondary | ICD-10-CM

## 2017-02-11 DIAGNOSIS — F419 Anxiety disorder, unspecified: Secondary | ICD-10-CM

## 2017-02-11 DIAGNOSIS — F3110 Bipolar disorder, current episode manic without psychotic features, unspecified: Secondary | ICD-10-CM | POA: Diagnosis not present

## 2017-02-11 DIAGNOSIS — Z79899 Other long term (current) drug therapy: Secondary | ICD-10-CM

## 2017-02-11 DIAGNOSIS — F319 Bipolar disorder, unspecified: Secondary | ICD-10-CM

## 2017-02-11 DIAGNOSIS — I10 Essential (primary) hypertension: Secondary | ICD-10-CM | POA: Diagnosis not present

## 2017-02-11 LAB — CBC WITH DIFFERENTIAL/PLATELET
BASOS ABS: 0 {cells}/uL (ref 0–200)
Basophils Relative: 0 %
EOS ABS: 90 {cells}/uL (ref 15–500)
Eosinophils Relative: 1 %
HEMATOCRIT: 35.2 % (ref 35.0–45.0)
HEMOGLOBIN: 11.4 g/dL — AB (ref 11.7–15.5)
Lymphocytes Relative: 31 %
Lymphs Abs: 2790 cells/uL (ref 850–3900)
MCH: 27.3 pg (ref 27.0–33.0)
MCHC: 32.4 g/dL (ref 32.0–36.0)
MCV: 84.4 fL (ref 80.0–100.0)
MONO ABS: 720 {cells}/uL (ref 200–950)
MPV: 10.8 fL (ref 7.5–12.5)
Monocytes Relative: 8 %
Neutro Abs: 5400 cells/uL (ref 1500–7800)
Neutrophils Relative %: 60 %
Platelets: 227 10*3/uL (ref 140–400)
RBC: 4.17 MIL/uL (ref 3.80–5.10)
RDW: 14.6 % (ref 11.0–15.0)
WBC: 9 10*3/uL (ref 3.8–10.8)

## 2017-02-11 NOTE — Progress Notes (Signed)
[facility]  Patient Identification: Heather FolkMary Marquez MRN:  [id number] Date of Evaluation:  02/11/2017 [facility] Chief Complaint:   Chief Complaint    Follow-up     Visit Diagnosis:    ICD-9-CM ICD-10-CM   1. Bipolar I disorder (HCC) 296.7 F31.9 Hepatic function panel     Ammonia     CBC with Differential  2. Essential hypertension 401.9 I10     History of Present Illness:  56 years old currently married African-American female [facility] for making [facility] since she has relocated.  Patient has been diagnosed with bipolar she is on disability since 2000. She has had prior manic episodes and that she was singing loud than the extra joyous. Also has episodes of depression. [place]. Reasonably mantained on depakote .  Has been on haldol in past which was discontinued.   She has no recurrence of mania, psychosis and is not on haldol. Does have some tremors of outstretched arm, had some invol. Fascial movments as per history.  She feels comfortable with depakote at 1000mg  Level was requested 86.  Ammonia not done yet, says she was dehydrated Will write orders  Aggravating factor: non compliance in past Modifying factor: husband   Past Psychiatric History: bipolar, manic as per admission history  Previous Psychotropic Medications: Yes     Family History: History reviewed. No pertinent family history.  Social History:   Social History   Social History  . Marital status: Married    Spouse name: N/A  . Number of children: N/A  . Years of education: N/A   Social History Main Topics  . Smoking status: Never Smoker  . Smokeless tobacco: Never Used  . Alcohol use No  . Drug use: No  . Sexual activity: Not Asked   Other Topics Concern  . None   Social History Narrative  . None      Allergies:  No Known Allergies  Metabolic Disorder  Labs: No results found for: HGBA1C, MPG No results found for: PROLACTIN No results found for: CHOL, TRIG, HDL, CHOLHDL, VLDL, LDLCALC   Current Medications: Current Outpatient Prescriptions  Medication Sig Dispense Refill  . amLODipine (NORVASC) 10 MG tablet Take by mouth.    Marland Kitchen. atorvastatin (LIPITOR) 10 MG tablet Take by mouth.    . divalproex (DEPAKOTE) 500 MG DR tablet Take by mouth.    . labetalol (NORMODYNE) 200 MG tablet Take by mouth.    . metFORMIN (GLUCOPHAGE) 500 MG tablet Take by mouth.    . pioglitazone (ACTOS) 30 MG tablet Take by mouth.    . valsartan (DIOVAN) 80 MG tablet Take by mouth.     No current facility-administered medications for this visit.       Psychiatric Specialty Exam: Review of Systems  Cardiovascular: Negative for palpitations.  Musculoskeletal: Negative for neck pain.  Skin: Negative for rash.  Neurological: Positive for tremors.  Psychiatric/Behavioral: Negative for depression.    Blood pressure 128/80, pulse 85, resp. rate 16, height 5\' 5"  (1.651 m), weight 235 lb (106.6 kg), SpO2 98 %.Body mass index is 39.11 kg/m.  General Appearance: Casual  Eye Contact:  Fair  Speech:  Slow  Volume:  Decreased  Mood:  euthymic  Affect: constricted  Thought Process:  Goal Directed  Orientation:  Full (Time, Place, and Person)  Thought Content:  Rumination  Suicidal Thoughts:  No  Homicidal Thoughts:  No  Memory:  Immediate;  Fair Recent;   Fair  Judgement:  Fair  Insight:  Fair  Psychomotor Activity:  Decreased  Concentration:  Concentration: Fair and Attention Span: Fair  Recall:  [employer] of Knowledge:Fair  Language: Fair  Akathisia:  Yes  Handed:  Right  AIMS (if indicated):  Minimal distress  Assets:  Desire for Improvement Social Support  ADL's:  Intact  Cognition: WNL  Sleep:  fair    Treatment Plan Summary: Medication management and Plan as follows   1. Bipolar per history and having manic episodes. : stable for now.  Continue depakote. Has meds for now. Can call in for 90 day supply for [facility] when needed  Side effects and questions reviewed  2. Anxiety: baseline. Does not want to be on meds  3. Sleep: adequate  Denies using drugs or alcohol  Will write for ammonia, LFT, CBC since she is on depakote Labs reviewed  FU 3 months or earlier if needed.  Thresa Ross, MD 6/4/20181:14 PM

## 2017-02-12 LAB — HEPATIC FUNCTION PANEL
ALK PHOS: 42 U/L (ref 33–130)
ALT: 9 U/L (ref 6–29)
AST: 13 U/L (ref 10–35)
Albumin: 4 g/dL (ref 3.6–5.1)
BILIRUBIN DIRECT: 0.1 mg/dL (ref <=0.2)
BILIRUBIN TOTAL: 0.3 mg/dL (ref 0.2–1.2)
Indirect Bilirubin: 0.2 mg/dL (ref 0.2–1.2)
Total Protein: 7.2 g/dL (ref 6.1–8.1)

## 2017-02-12 LAB — AMMONIA: Ammonia: 40 umol/L (ref <=47)

## 2017-05-07 ENCOUNTER — Ambulatory Visit (HOSPITAL_COMMUNITY): Payer: Medicare HMO | Admitting: Psychiatry

## 2017-05-09 ENCOUNTER — Ambulatory Visit (INDEPENDENT_AMBULATORY_CARE_PROVIDER_SITE_OTHER): Payer: Medicare HMO | Admitting: Psychiatry

## 2017-05-09 ENCOUNTER — Encounter (HOSPITAL_COMMUNITY): Payer: Self-pay | Admitting: Psychiatry

## 2017-05-09 VITALS — BP 126/74 | HR 85 | Resp 18 | Ht 65.0 in | Wt 224.0 lb

## 2017-05-09 DIAGNOSIS — F319 Bipolar disorder, unspecified: Secondary | ICD-10-CM | POA: Diagnosis not present

## 2017-05-09 DIAGNOSIS — F419 Anxiety disorder, unspecified: Secondary | ICD-10-CM

## 2017-05-09 DIAGNOSIS — G4700 Insomnia, unspecified: Secondary | ICD-10-CM

## 2017-05-09 DIAGNOSIS — I10 Essential (primary) hypertension: Secondary | ICD-10-CM

## 2017-05-09 DIAGNOSIS — Z736 Limitation of activities due to disability: Secondary | ICD-10-CM | POA: Diagnosis not present

## 2017-05-09 MED ORDER — DIVALPROEX SODIUM 500 MG PO DR TAB: 1000.0000 mg | DELAYED_RELEASE_TABLET | Freq: Every day | ORAL | 2 refills | Status: DC

## 2017-05-09 NOTE — Progress Notes (Signed)
[facility] Outpatient Follow up visit  Patient Identification: Heather Marquez MRN:  [id number] Date of Evaluation:  05/09/2017 Referral Source: [facility] mental health Chief Complaint:   Chief Complaint    Follow-up     Visit Diagnosis:    ICD-10-CM   1. Bipolar I disorder (HCC) F31.9   2. Essential hypertension I10     History of Present Illness:  56 years old currently married African-American female initially referred by [facility] mental healthcare for making appointments since she has relocated.  Patient has been diagnosed with bipolar she is on disability since 2000. She has had prior manic episodes and that she was singing loud than the extra joyous. Also has episodes of depression. She has moved from [place]. Reasonably mantained on depakote .  Has been on haldol in past which was discontinued.   Patient remains at baseline with her Depakote. She does have some involuntary movements but does not want to take any medications says that it is not bothersome. This is around the job.  Ammonia levels 40 other labs including CBC and LFTs are normal She has no recurrence of mania, psychosis and is not on haldol. Sleep adequate. Reasonable support system BP controlled  Aggravating factor: non compliance in past.  Modifying factor: husband   Past Psychiatric History: bipolar, manic as per admission history  Previous Psychotropic Medications: Yes     Family History: History reviewed. No pertinent family history.  Social History:   Social History   Social History  . Marital status: Married    Spouse name: N/A  . Number of children: N/A  . Years of education: N/A   Social History Main Topics  . Smoking status: Never Smoker  . Smokeless tobacco: Never Used  . Alcohol use No  . Drug use: No  . Sexual activity: Not Asked   Other Topics Concern  . None   Social History Narrative  . None      Allergies:  No Known Allergies  Metabolic Disorder Labs: No  results found for: HGBA1C, MPG No results found for: PROLACTIN No results found for: CHOL, TRIG, HDL, CHOLHDL, VLDL, LDLCALC   Current Medications: Current Outpatient Prescriptions  Medication Sig Dispense Refill  . amLODipine (NORVASC) 10 MG tablet Take by mouth.    Marland Kitchen atorvastatin (LIPITOR) 10 MG tablet Take by mouth.    . divalproex (DEPAKOTE) 500 MG DR tablet Take 2 tablets (1,000 mg total) by mouth at bedtime. 60 tablet 2  . labetalol (NORMODYNE) 200 MG tablet Take by mouth.    . losartan (COZAAR) 100 MG tablet Take by mouth.    . meloxicam (MOBIC) 7.5 MG tablet Take by mouth.    . metFORMIN (GLUCOPHAGE) 500 MG tablet Take by mouth.    . pioglitazone (ACTOS) 30 MG tablet Take by mouth.    . pioglitazone (ACTOS) 30 MG tablet Take by mouth.    Marland Kitchen tiZANidine (ZANAFLEX) 2 MG tablet Take by mouth.    . valsartan (DIOVAN) 80 MG tablet Take by mouth.     No current facility-administered medications for this visit.       Psychiatric Specialty Exam: Review of Systems  Cardiovascular: Negative for chest pain.  Musculoskeletal: Negative for neck pain.  Skin: Negative for rash.  Neurological: Positive for tremors.  Psychiatric/Behavioral: Negative for depression.    Blood pressure 126/74, pulse 85, resp. rate 18, height 5\' 5"  (1.651 m), weight 224 lb (101.6 kg), SpO2 95 %.Body mass index is 37.28 kg/m.  General Appearance: Casual  Eye Contact:  Fair  Speech:  Slow  Volume:  Decreased  Mood:  euthymic  Affect: constricted. Did smile some today  Thought Process:  Goal Directed  Orientation:  Full (Time, Place, and Person)  Thought Content:  Rumination  Suicidal Thoughts:  No  Homicidal Thoughts:  No  Memory:  Immediate;   Fair Recent;   Fair  Judgement:  Fair  Insight:  Fair  Psychomotor Activity:  Decreased  Concentration:  Concentration: Fair and Attention Span: Fair  Recall:  [employer]  Fund of Knowledge:Fair  Language: Fair  Akathisia:  Yes  Handed:  Right  AIMS (if  indicated):  Minimal distress  Assets:  Desire for Improvement Social Support  ADL's:  Intact  Cognition: WNL  Sleep:  fair    Treatment Plan Summary: Medication management and Plan as follows   1. Bipolar per history and having manic episodes. :stable. Continue depakote 1000mg  at night  Side effects and questions reviewed  2. Anxiety:baseline. Continue meds and provided suppportive therapy 3. Sleep: reasonable. Reviewed sleep hygiene Denies using drugs or alcohol  Follow-up in 2 months. Depakote questions were addressed. Follow with her primary care physician regarding to her physical health and medical concerns Thresa RossAKHTAR, Hasana Alcorta, MD 8/30/20181:45 PM

## 2017-07-15 ENCOUNTER — Other Ambulatory Visit (HOSPITAL_COMMUNITY): Payer: Self-pay | Admitting: Psychiatry

## 2017-07-24 NOTE — Telephone Encounter (Addendum)
Medication refill- received [facility] from [facility] for Depakote. Per Dr. Gilmore LarocheAkhtar, refill is authorize for Depakote 500mg , #180. Rx was sent to pharmacy. Pt is schedule for a follow up apt on 07/30/17. Lvm informing pt of refill status. Nothing further is need at this time.

## 2017-07-30 ENCOUNTER — Ambulatory Visit (INDEPENDENT_AMBULATORY_CARE_PROVIDER_SITE_OTHER): Payer: Medicare HMO | Admitting: Psychiatry

## 2017-07-30 ENCOUNTER — Other Ambulatory Visit: Payer: Self-pay

## 2017-07-30 ENCOUNTER — Encounter (HOSPITAL_COMMUNITY): Payer: Self-pay | Admitting: Psychiatry

## 2017-07-30 VITALS — BP 132/76 | HR 74 | Ht 65.0 in | Wt 212.0 lb

## 2017-07-30 DIAGNOSIS — F319 Bipolar disorder, unspecified: Secondary | ICD-10-CM

## 2017-07-30 NOTE — Progress Notes (Signed)
[facility] Out[facility] Follow [facility]  Patient Identification: Heather FolkMary Marquez MRN:  [id number] Date of Evaluation:  07/30/2017 [facility] Chief Complaint:   Chief Complaint    Follow-up; Manic Behavior     Visit Diagnosis:    ICD-10-CM   1. Bipolar I disorder (HCC) F31.9     History of Present Illness:  56 years old currently married African-American female [facility] for making appointments since she has relocated.  Patient has been diagnosed with bipolar she is on disability since 2000. She has had prior manic episodes and that she was singing loud than the extra joyous. Living with sister, doing reasonable. No manic.  Mood is balanced. Tolerating meds  Has involuntary movements, baseline not interested to add med for that  Ammonia levels 40 other labs including CBC and LFTs are normal   Aggravating factor:non compliance in past  Modifying factor: family  Past Psychiatric History: bipolar, manic as per admission history  Previous Psychotropic Medications: Yes     Family History: History reviewed. No pertinent family history.  Social History:   Social History   Socioeconomic History  . Marital status: Married    Spouse name: None  . Number of children: None  . Years of education: None  . Highest education level: None  Social Needs  . Financial resource strain: None  . Food insecurity - worry: None  . Food insecurity - inability: None  . Transportation needs - medical: None  . Transportation needs - non-medical: None  Occupational History  . None  Tobacco Use  . Smoking status: Never Smoker  . Smokeless tobacco: Never Used  Substance and Sexual Activity  . Alcohol use: No  . Drug use: No  . Sexual activity: None  Other Topics Concern  . None  Social History Narrative  . None      Allergies:  No Known Allergies  Metabolic Disorder Labs: No results found for: HGBA1C,  MPG No results found for: PROLACTIN No results found for: CHOL, TRIG, HDL, CHOLHDL, VLDL, LDLCALC   Current Medications: Current Outpatient Medications  Medication Sig Dispense Refill  . amLODipine (NORVASC) 10 MG tablet Take by mouth.    . divalproex (DEPAKOTE) 500 MG DR tablet TAKE 2 TABLETS AT BEDTIME 180 tablet 2  . labetalol (NORMODYNE) 200 MG tablet Take by mouth.    . losartan (COZAAR) 100 MG tablet Take by mouth.    . metFORMIN (GLUCOPHAGE) 500 MG tablet Take by mouth.    . pioglitazone (ACTOS) 30 MG tablet Take by mouth.    . pioglitazone (ACTOS) 30 MG tablet Take by mouth.    Marland Kitchen. atorvastatin (LIPITOR) 10 MG tablet Take by mouth.    . valsartan (DIOVAN) 80 MG tablet Take by mouth.     No current facility-administered medications for this visit.       Psychiatric Specialty Exam: Review of Systems  Cardiovascular: Negative for palpitations.  Musculoskeletal: Negative for neck pain.  Skin: Negative for rash.  Neurological: Positive for tremors.  Psychiatric/Behavioral: Negative for depression.    Blood pressure 132/76, pulse 74, height 5\' 5"  (1.651 m), weight 212 lb (96.2 kg).Body mass index is 35.28 kg/m.  General Appearance: Casual  Eye Contact:  Fair  Speech:  Slow  Volume:  Decreased  Mood: fair  Affect: constricted coopeative  Thought Process:  Goal Directed  Orientation:  Full (Time, Place, and Person)  Thought Content:  Rumination  Suicidal Thoughts:  No  Homicidal Thoughts:  No  Memory:  Immediate;   Fair Recent;   Fair  Judgement:  Fair  Insight:  Fair  Psychomotor Activity:  Decreased  Concentration:  Concentration: Fair and Attention Span: Fair  Recall:  [employer]  Fund of Knowledge:Fair  Language: Fair  Akathisia:  Yes  Handed:  Right  AIMS (if indicated):  Minimal distress  Assets:  Desire for Improvement Social Support  ADL's:  Intact  Cognition: WNL  Sleep:  fair    Treatment Plan Summary: Medication management and Plan as follows   1.  Bipolar per history and having manic episodes. Remains stable and compliant on depakote 2. Anxiety:not worse. Provided supportive therapy 3. Sleep: baseline. Not worse FU 3-4 months. Has refills for now reveiwed concerns /questions addressed F11/20/20181:51 PM

## 2017-11-19 ENCOUNTER — Encounter (HOSPITAL_COMMUNITY): Payer: Self-pay | Admitting: Psychiatry

## 2017-11-19 ENCOUNTER — Ambulatory Visit (INDEPENDENT_AMBULATORY_CARE_PROVIDER_SITE_OTHER): Payer: Medicare HMO | Admitting: Psychiatry

## 2017-11-19 VITALS — BP 122/88 | HR 70 | Ht 65.0 in | Wt 198.0 lb

## 2017-11-19 DIAGNOSIS — F419 Anxiety disorder, unspecified: Secondary | ICD-10-CM | POA: Diagnosis not present

## 2017-11-19 DIAGNOSIS — Z736 Limitation of activities due to disability: Secondary | ICD-10-CM | POA: Diagnosis not present

## 2017-11-19 DIAGNOSIS — F319 Bipolar disorder, unspecified: Secondary | ICD-10-CM | POA: Diagnosis not present

## 2017-11-19 DIAGNOSIS — I10 Essential (primary) hypertension: Secondary | ICD-10-CM

## 2017-11-19 DIAGNOSIS — G4700 Insomnia, unspecified: Secondary | ICD-10-CM

## 2017-11-19 DIAGNOSIS — R251 Tremor, unspecified: Secondary | ICD-10-CM

## 2017-11-19 NOTE — Progress Notes (Signed)
[facility]  Patient Identification: Heather FolkMary Marquez MRN:  [id number] Date of Evaluation:  11/19/2017 [facility] Chief Complaint:   Chief Complaint    Follow-up; Other     Visit Diagnosis:    ICD-10-CM   1. Bipolar I disorder (HCC) F31.9 Valproic Acid level  2. Essential hypertension I10 Valproic Acid level    History of Present Illness:  57 years old currently married African-American female [facility] for making [facility] since she has relocated.  Patient has been diagnosed with bipolar she is on disability since 2000. No mania.  Doing fair. Drove today No worsening of tremors Trying to work on loosing weight.    Aggravating factor:non compliance in the past Modifying factor: family Past Psychiatric History: bipolar, manic as per admission history  Previous Psychotropic Medications: Yes     Family History: History reviewed. No pertinent family history.  Social History:   Social History   Socioeconomic History  . Marital status: Married    Spouse name: None  . Number of children: None  . Years of education: None  . Highest education level: None  Social Needs  . Financial resource strain: None  . Food insecurity - worry: None  . Food insecurity - inability: None  . Transportation needs - medical: None  . Transportation needs - non-medical: None  Occupational History  . None  Tobacco Use  . Smoking status: Never Smoker  . Smokeless tobacco: Never Used  Substance and Sexual Activity  . Alcohol use: No  . Drug use: No  . Sexual activity: None  Other Topics Concern  . None  Social History Narrative  . None      Allergies:  No Known Allergies  Metabolic Disorder Labs: No results found for: HGBA1C, MPG No results found for: PROLACTIN No results found for: CHOL, TRIG, HDL, CHOLHDL, VLDL, LDLCALC   Current Medications: Current Outpatient Medications   Medication Sig Dispense Refill  . amLODipine (NORVASC) 10 MG tablet Take by mouth.    . divalproex (DEPAKOTE) 500 MG DR tablet TAKE 2 TABLETS AT BEDTIME 180 tablet 2  . labetalol (NORMODYNE) 200 MG tablet Take by mouth.    . losartan (COZAAR) 100 MG tablet Take by mouth.    . metFORMIN (GLUCOPHAGE) 500 MG tablet Take by mouth.    . pioglitazone (ACTOS) 30 MG tablet Take by mouth.    . pioglitazone (ACTOS) 30 MG tablet Take by mouth.    . rosuvastatin (CRESTOR) 5 MG tablet Take by mouth.    . valsartan (DIOVAN) 80 MG tablet Take by mouth.    Marland Kitchen. atorvastatin (LIPITOR) 10 MG tablet Take by mouth.     No current facility-administered medications for this visit.       Psychiatric Specialty Exam: Review of Systems  Cardiovascular: Negative for chest pain.  Musculoskeletal: Negative for neck pain.  Skin: Negative for rash.  Neurological: Positive for tremors.  Psychiatric/Behavioral: Negative for depression.    Blood pressure 122/88, pulse 70, height 5\' 5"  (1.651 m), weight 198 lb (89.8 kg).Body mass index is 32.95 kg/m.  General Appearance: Casual  Eye Contact:  Fair  Speech:  Slow  Volume:  Decreased  Mood: FAIR  Affect: constricted coopeative  Thought Process:  Goal Directed  Orientation:  Full (Time, Place, and Person)  Thought Content:  Rumination  Suicidal Thoughts:  No  Homicidal Thoughts:  No  Memory:  Immediate;   Fair Recent;   Fair  Judgement:  Fair  Insight:  Fair  Psychomotor Activity:  Decreased  Concentration:  Concentration: Fair and Attention Span: Fair  Recall:  [employer] of Knowledge:Fair  Language: Fair  Akathisia:  Yes  Handed:  Right  AIMS (if indicated):  Minimal distress  Assets:  Desire for Improvement Social Support  ADL's:  Intact  Cognition: WNL  Sleep:  fair    Treatment Plan Summary: Medication management and Plan as follows   1. Bipolar per history: remains stable. Continue depakote  will write for blood levels of valproate  2.  Anxiety; not worse. Provided suppotive therapy 3. Sleep: doing fair Has refills.  Fu 31m or earlier if needed

## 2017-11-20 LAB — VALPROIC ACID LEVEL: VALPROIC ACID LVL: 84.8 mg/L (ref 50.0–100.0)

## 2018-03-24 ENCOUNTER — Other Ambulatory Visit (HOSPITAL_COMMUNITY): Payer: Self-pay | Admitting: Psychiatry

## 2018-05-13 ENCOUNTER — Ambulatory Visit (INDEPENDENT_AMBULATORY_CARE_PROVIDER_SITE_OTHER): Payer: Medicare HMO | Admitting: Psychiatry

## 2018-05-13 ENCOUNTER — Encounter (HOSPITAL_COMMUNITY): Payer: Self-pay | Admitting: Psychiatry

## 2018-05-13 VITALS — BP 130/80 | HR 68 | Ht 63.5 in | Wt 176.0 lb

## 2018-05-13 DIAGNOSIS — F319 Bipolar disorder, unspecified: Secondary | ICD-10-CM | POA: Diagnosis not present

## 2018-05-13 NOTE — Progress Notes (Signed)
[facility] Outpatient Follow up visit  Patient Identification: Heather Marquez MRN:  [id number] Date of Evaluation:  05/13/2018 Referral Source: [facility] mental health Chief Complaint:   Chief Complaint    Follow-up     Visit Diagnosis:    ICD-10-CM   1. Bipolar I disorder (HCC) F31.9     History of Present Illness:  57 years old currently married African-American female initially referred by [facility] for making appointments since she has relocated.  Patient has been diagnosed with bipolar she is on disability since 2000. Doing fair . depakote last was 80 No side effects  she is loosing weiht goes to gym  No mania.   No worsening of tremors   Aggravating factor:non compliance in the past Modifying factor: family  Past Psychiatric History: bipolar, manic as per admission history  Previous Psychotropic Medications: Yes     Family History: History reviewed. No pertinent family history.  Social History:   Social History   Socioeconomic History  . Marital status: Married    Spouse name: Not on file  . Number of children: Not on file  . Years of education: Not on file  . Highest education level: Not on file  Occupational History  . Not on file  Social Needs  . Financial resource strain: Not on file  . Food insecurity:    Worry: Not on file    Inability: Not on file  . Transportation needs:    Medical: Not on file    Non-medical: Not on file  Tobacco Use  . Smoking status: Never Smoker  . Smokeless tobacco: Never Used  Substance and Sexual Activity  . Alcohol use: No  . Drug use: No  . Sexual activity: Yes    Partners: Male  Lifestyle  . Physical activity:    Days per week: Not on file    Minutes per session: Not on file  . Stress: Not on file  Relationships  . Social connections:    Talks on phone: Not on file    Gets together: Not on file    Attends religious service: Not on file    Active member of club or organization: Not  on file    Attends meetings of clubs or organizations: Not on file    Relationship status: Not on file  Other Topics Concern  . Not on file  Social History Narrative  . Not on file      Allergies:  No Known Allergies  Metabolic Disorder Labs: No results found for: HGBA1C, MPG No results found for: PROLACTIN No results found for: CHOL, TRIG, HDL, CHOLHDL, VLDL, LDLCALC   Current Medications: Current Outpatient Medications  Medication Sig Dispense Refill  . divalproex (DEPAKOTE) 500 MG DR tablet TAKE 2 TABLETS AT BEDTIME 180 tablet 2  . labetalol (NORMODYNE) 200 MG tablet Take by mouth.    . rosuvastatin (CRESTOR) 5 MG tablet Take by mouth.    . valsartan (DIOVAN) 80 MG tablet Take by mouth.     No current facility-administered medications for this visit.       Psychiatric Specialty Exam: Review of Systems  Cardiovascular: Negative for chest pain.  Musculoskeletal: Negative for neck pain.  Skin: Negative for itching.  Neurological: Positive for tremors.  Psychiatric/Behavioral: Negative for depression.    Blood pressure 130/80, pulse 68, height 5' 3.5" (1.613 m), weight 176 lb (79.8 kg), SpO2 98 %.Body mass index is 30.69 kg/m.  General Appearance: Casual  Eye Contact:  Fair  Speech:  Slow  Volume:  Decreased  Mood: FAIR  Affect: more reactive  Thought Process:  Goal Directed  Orientation:  Full (Time, Place, and Person)  Thought Content:  Rumination  Suicidal Thoughts:  No  Homicidal Thoughts:  No  Memory:  Immediate;   Fair Recent;   Fair  Judgement:  Fair  Insight:  Fair  Psychomotor Activity:  Decreased  Concentration:  Concentration: Fair and Attention Span: Fair  Recall:  [employer] of Knowledge:Fair  Language: Fair  Akathisia:  Yes  Handed:  Right  AIMS (if indicated):  Minimal distress  Assets:  Desire for Improvement Social Support  ADL's:  Intact  Cognition: WNL  Sleep:  fair    Treatment Plan Summary: Medication management and Plan as  follows   1. Bipolar per history: remains stable. Reviewed depakote level  2. Anxiety; not worse. Provided suppotive therapy 3. Sleep: doing fair Has refills.  Fu 42m or earlier if needed

## 2018-07-15 ENCOUNTER — Telehealth (HOSPITAL_COMMUNITY): Payer: Self-pay

## 2018-07-15 NOTE — Telephone Encounter (Signed)
Patient called to confirm appt 

## 2018-09-16 ENCOUNTER — Other Ambulatory Visit: Payer: Self-pay

## 2018-09-16 ENCOUNTER — Encounter (HOSPITAL_COMMUNITY): Payer: Self-pay | Admitting: Psychiatry

## 2018-09-16 ENCOUNTER — Ambulatory Visit (INDEPENDENT_AMBULATORY_CARE_PROVIDER_SITE_OTHER): Payer: Medicare HMO | Admitting: Psychiatry

## 2018-09-16 VITALS — BP 118/82 | Ht 63.5 in | Wt 177.0 lb

## 2018-09-16 DIAGNOSIS — F319 Bipolar disorder, unspecified: Secondary | ICD-10-CM

## 2018-09-16 DIAGNOSIS — I10 Essential (primary) hypertension: Secondary | ICD-10-CM

## 2018-09-16 MED ORDER — DIVALPROEX SODIUM 500 MG PO DR TAB: 1000.0000 mg | DELAYED_RELEASE_TABLET | Freq: Every day | ORAL | 1 refills | Status: DC

## 2018-09-16 NOTE — Progress Notes (Signed)
[facility]  Patient Identification: Heather FolkMary Berke MRN:  [id number] Date of Evaluation:  09/16/2018 [facility] Chief Complaint:   Chief Complaint    Follow-up; Other     Visit Diagnosis:    ICD-10-CM   1. Bipolar I disorder (HCC) F31.9 Valproic acid level  2. Essential hypertension I10     History of Present Illness:  58 years old currently married African-American female [facility] since she has relocated.  Patient has been diagnosed with bipolar she is on disability since 2000.  Doing fair . No side effects  moved to new place. Like community Goes to gym  No worsening of tremors   Aggravating factor:non compliance in the past Modifying factor: husband  Past Psychiatric History: bipolar, manic as per admission history  Previous Psychotropic Medications: Yes     Family History: History reviewed. No pertinent family history.  Social History:   Social History   Socioeconomic History  . Marital status: Married    Spouse name: Not on file  . Number of children: Not on file  . Years of education: Not on file  . Highest education level: Not on file  Occupational History  . Not on file  Social Needs  . Financial resource strain: Not on file  . Food insecurity:    Worry: Not on file    Inability: Not on file  . Transportation needs:    Medical: Not on file    Non-medical: Not on file  Tobacco Use  . Smoking status: Never Smoker  . Smokeless tobacco: Never Used  Substance and Sexual Activity  . Alcohol use: No  . Drug use: No  . Sexual activity: Yes    Partners: Male  Lifestyle  . Physical activity:    Days per week: Not on file    Minutes per session: Not on file  . Stress: Not on file  Relationships  . Social connections:    Talks on phone: Not on file    Gets together: Not on file    Attends religious service: Not on file     Active member of club or organization: Not on file    Attends meetings of clubs or organizations: Not on file    Relationship status: Not on file  Other Topics Concern  . Not on file  Social History Narrative  . Not on file      Allergies:  No Known Allergies  Metabolic Disorder Labs: No results found for: HGBA1C, MPG No results found for: PROLACTIN No results found for: CHOL, TRIG, HDL, CHOLHDL, VLDL, LDLCALC   Current Medications: Current Outpatient Medications  Medication Sig Dispense Refill  . amLODipine (NORVASC) 10 MG tablet TAKE 1 TABLET EVERY DAY    . divalproex (DEPAKOTE) 500 MG DR tablet Take 2 tablets (1,000 mg total) by mouth at bedtime. 180 tablet 1  . labetalol (NORMODYNE) 200 MG tablet Take by mouth.    . Multiple Vitamin (THERA) TABS Take by mouth.    . rosuvastatin (CRESTOR) 5 MG tablet Take by mouth.    . valsartan (DIOVAN) 80 MG tablet Take by mouth.     No current facility-administered medications for this visit.       Psychiatric Specialty Exam: Review of Systems  Cardiovascular: Negative for palpitations.  Musculoskeletal: Negative for neck pain.  Skin: Negative for itching.  Psychiatric/Behavioral: Negative for depression.    Blood pressure 118/82, height 5' 3.5" (1.613  m), weight 177 lb (80.3 kg).Body mass index is 30.86 kg/m.  General Appearance: Casual  Eye Contact:  Fair  Speech:  Slow  Volume:  Decreased  Mood: fair  Affect: more reactive  Thought Process:  Goal Directed  Orientation:  Full (Time, Place, and Person)  Thought Content:  Rumination  Suicidal Thoughts:  No  Homicidal Thoughts:  No  Memory:  Immediate;   Fair Recent;   Fair  Judgement:  Fair  Insight:  Fair  Psychomotor Activity:  Decreased  Concentration:  Concentration: Fair and Attention Span: Fair  Recall:  [employer]Fair  Fund of Knowledge:Fair  Language: Fair  Akathisia:  Yes  Handed:  Right  AIMS (if indicated):  Minimal distress  Assets:  Desire for  Improvement Social Support  ADL's:  Intact  Cognition: WNL  Sleep:  fair    Treatment Plan Summary: Medication management and Plan as follows   1. Bipolar per history:remains stable. Will renewe depakote and request blood levels  2. Anxiety; not worse. Provided suppotive therapy 3. Sleep: doing fair Renewed meds. Fu [id number]

## 2018-09-17 LAB — VALPROIC ACID LEVEL: Valproic Acid Lvl: 58.1 mg/L (ref 50.0–100.0)

## 2019-02-10 ENCOUNTER — Ambulatory Visit (HOSPITAL_COMMUNITY): Payer: Medicare HMO | Admitting: Psychiatry

## 2019-02-10 ENCOUNTER — Ambulatory Visit (INDEPENDENT_AMBULATORY_CARE_PROVIDER_SITE_OTHER): Payer: Medicare HMO | Admitting: Psychiatry

## 2019-02-10 ENCOUNTER — Other Ambulatory Visit: Payer: Self-pay

## 2019-02-10 ENCOUNTER — Encounter (HOSPITAL_COMMUNITY): Payer: Self-pay | Admitting: Psychiatry

## 2019-02-10 DIAGNOSIS — F319 Bipolar disorder, unspecified: Secondary | ICD-10-CM | POA: Diagnosis not present

## 2019-02-10 MED ORDER — DIVALPROEX SODIUM 500 MG PO DR TAB: 1000.0000 mg | DELAYED_RELEASE_TABLET | Freq: Every day | ORAL | 0 refills | Status: DC

## 2019-02-10 NOTE — Progress Notes (Signed)
[facility]  Patient Identification: Heather Marquez MRN:  [id number] Date of Evaluation:  02/10/2019 [facility] Chief Complaint:   fu bipolar Visit Diagnosis:    ICD-10-CM   1. Bipolar I disorder (HCC) F31.9    I connected with Heather Marquez on 02/10/19 at  2:30 PM EDT by telephone and verified that I am speaking with the correct person using two identifiers.   I discussed the limitations, risks, security and privacy concerns of performing an evaluation and management service by telephone and the availability of in person appointments. I also discussed with the patient that there may be a patient responsible charge related to this service. The patient expressed understanding and agreed to proceed.  History of Present Illness:  58 years old currently married African-American female [facility] after her relocation here.  Patient has been diagnosed with bipolar she is on disability since 2000. Doing fair.   depakote level is 48.1 last done  denies any worsening Doing fair . No side effects Staying home due to pandemic, husband is supportive   Aggravating factor:non compliance in the past Modifying factor: husband  Past Psychiatric History: bipolar, manic as per admission history  Previous Psychotropic Medications: Yes     Family History: History reviewed. No pertinent family history.  Social History:   Social History   Socioeconomic History  . Marital status: Married    Spouse name: Not on file  . Number of children: Not on file  . Years of education: Not on file  . Highest education level: Not on file  Occupational History  . Not on file  Social Needs  . Financial resource strain: Not on file  . Food insecurity:    Worry: Not on file    Inability: Not on file  . Transportation needs:    Medical: Not on file    Non-medical: Not on file  Tobacco Use  . Smoking status:  Never Smoker  . Smokeless tobacco: Never Used  Substance and Sexual Activity  . Alcohol use: No  . Drug use: No  . Sexual activity: Yes    Partners: Male  Lifestyle  . Physical activity:    Days per week: Not on file    Minutes per session: Not on file  . Stress: Not on file  Relationships  . Social connections:    Talks on phone: Not on file    Gets together: Not on file    Attends religious service: Not on file    Active member of club or organization: Not on file    Attends meetings of clubs or organizations: Not on file    Relationship status: Not on file  Other Topics Concern  . Not on file  Social History Narrative  . Not on file      Allergies:  No Known Allergies  Metabolic Disorder Labs: No results found for: HGBA1C, MPG No results found for: PROLACTIN No results found for: CHOL, TRIG, HDL, CHOLHDL, VLDL, LDLCALC   Current Medications: Current Outpatient Medications  Medication Sig Dispense Refill  . amLODipine (NORVASC) 10 MG tablet TAKE 1 TABLET EVERY DAY    . divalproex (DEPAKOTE) 500 MG DR tablet Take 2 tablets (1,000 mg total) by mouth at bedtime. 180 tablet 0  . labetalol (NORMODYNE) 200 MG tablet Take by mouth.    . Multiple Vitamin (THERA) TABS Take by mouth.    . rosuvastatin (CRESTOR) 5 MG tablet Take by mouth.    .Marland Kitchen  valsartan (DIOVAN) 80 MG tablet Take by mouth.     No current facility-administered medications for this visit.       Psychiatric Specialty Exam: Review of Systems  Cardiovascular: Negative for chest pain.  Psychiatric/Behavioral: Negative for depression.    There were no vitals taken for this visit.There is no height or weight on file to calculate BMI.  General Appearance:   Eye Contact:    Speech:  Low   Volume:  Decreased  Mood: fair  Affect:   Thought Process:  Goal Directed  Orientation:  Full (Time, Place, and Person)  Thought Content:  Rumination  Suicidal Thoughts:  No  Homicidal Thoughts:  No  Memory:   Immediate;   Fair Recent;   Fair  Judgement:  Fair  Insight:  Fair  Psychomotor Activity:    Concentration:  Concentration: Fair and Attention Span: Fair  Recall:  [employer] of Knowledge:Fair  Language: Fair  Akathisia:    Handed:  Right  AIMS (if indicated):  Minimal distress  Assets:  Desire for Improvement Social Support  ADL's:  Intact  Cognition: WNL  Sleep:  fair    Treatment Plan Summary: Medication management and Plan as follows   1. Bipolar per history:remains stable, did not voice any new concerns and comfortable with meds Continue depakote  2. Anxiety; not worse. Provided suppotive therapy 3. Sleep: doing fair Renewed meds. Fu 4-33m I discussed the assessment and treatment plan with the patient. The patient was provided an opportunity to ask questions and all were answered. The patient agreed with the plan and demonstrated an understanding of the instructions.   The patient was advised to call back or seek an in-person evaluation if the symptoms worsen or if the condition fails to improve as anticipated.  I provided 15 minutes of non-face-to-face time during this encounter.

## 2019-04-20 DIAGNOSIS — F3177 Bipolar disorder, in partial remission, most recent episode mixed: Secondary | ICD-10-CM

## 2019-04-20 HISTORY — DX: Bipolar disorder, in partial remission, most recent episode mixed: F31.77

## 2019-05-14 ENCOUNTER — Other Ambulatory Visit (HOSPITAL_COMMUNITY): Payer: Self-pay | Admitting: Psychiatry

## 2019-05-16 ENCOUNTER — Other Ambulatory Visit: Payer: Self-pay

## 2019-05-16 ENCOUNTER — Encounter: Payer: Self-pay | Admitting: Emergency Medicine

## 2019-05-16 ENCOUNTER — Emergency Department (INDEPENDENT_AMBULATORY_CARE_PROVIDER_SITE_OTHER)
Admission: EM | Admit: 2019-05-16 | Discharge: 2019-05-16 | Disposition: A | Discharge status: Home / Self Care | Payer: Medicare HMO | Source: Home / Self Care

## 2019-05-16 ENCOUNTER — Emergency Department (INDEPENDENT_AMBULATORY_CARE_PROVIDER_SITE_OTHER): Payer: Medicare HMO

## 2019-05-16 DIAGNOSIS — M7989 Other specified soft tissue disorders: Secondary | ICD-10-CM

## 2019-05-16 DIAGNOSIS — M79672 Pain in left foot: Secondary | ICD-10-CM | POA: Diagnosis not present

## 2019-05-16 DIAGNOSIS — M19072 Primary osteoarthritis, left ankle and foot: Secondary | ICD-10-CM | POA: Diagnosis not present

## 2019-05-16 MED ORDER — NAPROXEN 375 MG PO TABS: 375.0000 mg | ORAL_TABLET | Freq: Two times a day (BID) | ORAL | 0 refills | Status: DC

## 2019-05-16 NOTE — ED Triage Notes (Signed)
Patient c/o left foot pain x 5 days, no injury, taking Aleve with no relief.

## 2019-05-16 NOTE — Discharge Instructions (Signed)
°  You may take 500mg  acetaminophen every 4-6 hours or in combination with the prescribed naproxen as needed for pain and inflammation.  Please call to schedule an appointment with Sports Medicine or Podiatry next week if not improving.

## 2019-05-16 NOTE — ED Provider Notes (Signed)
[facility]    CSN: [id number] Arrival date & time: 05/16/19  1117      History   Chief Complaint Chief Complaint  Patient presents with  . Foot Pain    HPI Heather Marquez is a 58 y.o. female.   HPI Heather Marquez is a 58 y.o. female presenting to UC with c/o Left foot pain with mild swelling for 5 days. No known injury.  She has taken Aleve 1-2 times daily w/o relief.  Pain is aching and throbbing. She has been on her feet more recently. No hx of gout.     History reviewed. No pertinent past medical history.  Patient Active Problem List   Diagnosis Date Noted  . Essential hypertension 01/15/2017  . Diabetes ([place]) 01/15/2017    History reviewed. No pertinent surgical history.  OB History   No obstetric history on file.      Home Medications    Prior to Admission medications   Medication Sig Start Date End Date Taking? Authorizing Provider  amLODipine (NORVASC) 10 MG tablet TAKE 1 TABLET EVERY DAY 05/19/18   [provider]  divalproex (DEPAKOTE) 500 MG DR tablet Take 2 tablets (1,000 mg total) by mouth at bedtime. 02/10/19   Merian Capron, MD  Multiple Vitamin (THERA) TABS Take by mouth.    [provider]  naproxen (NAPROSYN) 375 MG tablet Take 1 tablet (375 mg total) by mouth 2 (two) times daily. 05/16/19   Noe Gens, PA-C  rosuvastatin (CRESTOR) 5 MG tablet Take by mouth. 10/21/17   [provider]  valsartan (DIOVAN) 80 MG tablet Take by mouth.    [provider]    Family History No family history on file.  Social History Social History   Tobacco Use  . Smoking status: Never Smoker  . Smokeless tobacco: Never Used  Substance Use Topics  . Alcohol use: No  . Drug use: No     Allergies   Patient has no known allergies.   Review of Systems Review of Systems  Musculoskeletal: Positive for arthralgias and myalgias. Negative for gait problem.  Skin: Negative for color change and wound.  Neurological:  Negative for weakness and numbness.     Physical Exam Triage Vital Signs ED Triage Vitals  Enc Vitals Group     BP 05/16/19 1207 (!) 149/76     Pulse Rate 05/16/19 1207 68     Resp 05/16/19 1207 17     Temp 05/16/19 1207 97.6 F (36.4 C)     Temp Source 05/16/19 1207 Oral     SpO2 05/16/19 1207 100 %     Weight 05/16/19 1207 165 lb (74.8 kg)     Height --      Head Circumference --      Peak Flow --      Pain Score 05/16/19 1211 3     Pain Loc --      Pain Edu? --      Excl. in [place]? --    No data found.  Updated Vital Signs BP (!) 149/76 (BP Location: Right Arm)   Pulse 68   Temp 97.6 F (36.4 C) (Oral)   Resp 17   Wt 165 lb (74.8 kg)   SpO2 100%   BMI 28.77 kg/m   Visual Acuity Right Eye Distance:   Left Eye Distance:   Bilateral Distance:    Right Eye Near:   Left Eye Near:    Bilateral Near:  Physical Exam Vitals signs and nursing note reviewed.  Constitutional:      Appearance: Normal appearance. She is well-developed.  HENT:     Head: Normocephalic and atraumatic.  Neck:     Musculoskeletal: Normal range of motion.  Cardiovascular:     Rate and Rhythm: Normal rate and regular rhythm.     Pulses:          Dorsalis pedis pulses are 2+ on the left side.  Pulmonary:     Effort: Pulmonary effort is normal.  Musculoskeletal: Normal range of motion.        General: Swelling, tenderness and deformity (hallus valgus of Left great toe ) present.     Comments: Left foot: mild to moderate edema, mild tenderness along distal lateral 5th metatarsal. Full ROM ankle and toes.   Skin:    General: Skin is warm and dry.     Capillary Refill: Capillary refill takes less than 2 seconds.     Findings: No bruising or erythema.  Neurological:     General: No focal deficit present.     Mental Status: She is alert and oriented to person, place, and time.  Psychiatric:        Behavior: Behavior normal.      UC Treatments / Results  Labs (all labs ordered are  listed, but only abnormal results are displayed) Labs Reviewed - No data to display  EKG   Radiology Dg Foot Complete Left  Result Date: 05/16/2019 CLINICAL DATA:  Left foot pain. Tenderness along fifth metatarsal bone. No known injury. EXAM: LEFT FOOT - COMPLETE 3+ VIEW COMPARISON:  None. FINDINGS: There is a marked hallux valgus deformity with secondary degenerative changes at the first MTP joint. No fractures or dislocations identified. IMPRESSION: 1. No acute abnormality. 2. 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 Electronically Signed   By: Signa Kellaylor  Stroud M.D.   On: 05/16/2019 12:56    Procedures Procedures (including critical care time)  Medications Ordered in UC Medications - No data to display  Initial Impression / Assessment and Plan / UC Course  I have reviewed the triage vital signs and the nursing notes.  Pertinent labs & imaging results that were available during my care of the patient were reviewed by me and considered in my medical decision making (see chart for details).     Reviewed imaging with pt Will tx conservatively Encouraged f/u with Sports Medicine or Podiatry  AVS provided  Final Clinical Impressions(s) / UC Diagnoses   Final diagnoses:  Pain in left foot  Swelling of left foot     Discharge Instructions      You may take 500mg  acetaminophen every 4-6 hours or in combination with the prescribed naproxen as needed for pain and inflammation.  Please call to schedule an appointment with Sports Medicine or Podiatry next week if not improving.     ED Prescriptions    Medication Sig Dispense Auth. Provider   naproxen (NAPROSYN) 375 MG tablet Take 1 tablet (375 mg total) by mouth 2 (two) times daily. 20 tablet Lurene ShadowPhelps, Brain Honeycutt O, PA-C     Controlled Substance Prescriptions [place] Controlled Substance Registry consulted? Not Applicable   Rolla Platehelps, Starlet Gallentine O, PA-C 05/17/19 [id number]

## 2019-05-26 ENCOUNTER — Telehealth (HOSPITAL_COMMUNITY): Payer: Self-pay

## 2019-05-26 NOTE — Telephone Encounter (Signed)
Patient called requesting a refill on her Depakote 500mg . She is using [facility] and has a scheduled appointment for 08/12/19. Please review and advise. Thank you.

## 2019-05-27 NOTE — Telephone Encounter (Signed)
Can send refill 

## 2019-06-02 MED ORDER — DIVALPROEX SODIUM 500 MG PO DR TAB: 1000.0000 mg | DELAYED_RELEASE_TABLET | Freq: Every day | ORAL | 0 refills | Status: DC

## 2019-07-30 ENCOUNTER — Other Ambulatory Visit (HOSPITAL_COMMUNITY): Payer: Self-pay | Admitting: Psychiatry

## 2019-08-11 ENCOUNTER — Ambulatory Visit (INDEPENDENT_AMBULATORY_CARE_PROVIDER_SITE_OTHER): Payer: Medicare HMO | Admitting: Psychiatry

## 2019-08-11 ENCOUNTER — Encounter (HOSPITAL_COMMUNITY): Payer: Self-pay | Admitting: Psychiatry

## 2019-08-11 DIAGNOSIS — F319 Bipolar disorder, unspecified: Secondary | ICD-10-CM

## 2019-08-11 DIAGNOSIS — I10 Essential (primary) hypertension: Secondary | ICD-10-CM

## 2019-08-11 MED ORDER — DIVALPROEX SODIUM 500 MG PO DR TAB: 1000.0000 mg | DELAYED_RELEASE_TABLET | Freq: Every day | ORAL | 0 refills | Status: DC

## 2019-08-11 NOTE — Progress Notes (Signed)
[facility] Outpatient Follow up visit  Patient Identification: Chevon Fomby MRN:  [id number] Date of Evaluation:  08/11/2019 Referral Source: [facility] mental health Chief Complaint:   fu bipolar Visit Diagnosis:    ICD-10-CM   1. Bipolar I disorder (HCC)  F31.9   2. Essential hypertension  I10     I connected with [name] on 08/11/19 at  4:30 PM EST by telephone and verified that I am speaking with the correct person using two identifiers.   I discussed the limitations, risks, security and privacy concerns of performing an evaluation and management service by telephone and the availability of in person appointments. I also discussed with the patient that there may be a patient responsible charge related to this service. The patient expressed understanding and agreed to proceed.  History of Present Illness:  58 years old currently married African-American female initially referred by [facility] committee mental healthcare after her relocation here.  Doing fair on depakote. Diagnosed and on disability for bipolar But works few hours  e  denies any worsening Doing fair . No side effects husband is supportive   Aggravating factor:non compliance in the past Modifying factor: husband Past Psychiatric History: bipolar, manic as per admission history  Previous Psychotropic Medications: Yes     Family History: History reviewed. No pertinent family history.  Social History:   Social History   Socioeconomic History  . Marital status: Married    Spouse name: Not on file  . Number of children: Not on file  . Years of education: Not on file  . Highest education level: Not on file  Occupational History  . Not on file  Social Needs  . Financial resource strain: Not on file  . Food insecurity    Worry: Not on file    Inability: Not on file  . Transportation needs    Medical: Not on file    Non-medical: Not on file  Tobacco Use  . Smoking status: Never Smoker  . Smokeless  tobacco: Never Used  Substance and Sexual Activity  . Alcohol use: No  . Drug use: No  . Sexual activity: Yes    Partners: Male  Lifestyle  . Physical activity    Days per week: Not on file    Minutes per session: Not on file  . Stress: Not on file  Relationships  . Social Musician on phone: Not on file    Gets together: Not on file    Attends religious service: Not on file    Active member of club or organization: Not on file    Attends meetings of clubs or organizations: Not on file    Relationship status: Not on file  Other Topics Concern  . Not on file  Social History Narrative  . Not on file      Allergies:  No Known Allergies  Metabolic Disorder Labs: No results found for: HGBA1C, MPG No results found for: PROLACTIN No results found for: CHOL, TRIG, HDL, CHOLHDL, VLDL, LDLCALC   Current Medications: Current Outpatient Medications  Medication Sig Dispense Refill  . amLODipine (NORVASC) 10 MG tablet TAKE 1 TABLET EVERY DAY    . divalproex (DEPAKOTE) 500 MG DR tablet Take 2 tablets (1,000 mg total) by mouth at bedtime. 180 tablet 0  . Multiple Vitamin (THERA) TABS Take by mouth.    . naproxen (NAPROSYN) 375 MG tablet Take 1 tablet (375 mg total) by mouth 2 (two) times daily. 20 tablet 0  . rosuvastatin (  CRESTOR) 5 MG tablet Take by mouth.    . valsartan (DIOVAN) 80 MG tablet Take by mouth.     No current facility-administered medications for this visit.       Psychiatric Specialty Exam: Review of Systems  Cardiovascular: Negative for chest pain.  Psychiatric/Behavioral: Negative for depression.    There were no vitals taken for this visit.There is no height or weight on file to calculate BMI.  General Appearance:   Eye Contact:    Speech:  Low   Volume:  Decreased  Mood: fair  Affect:   Thought Process:  Goal Directed  Orientation:  Full (Time, Place, and Person)  Thought Content:  Rumination  Suicidal Thoughts:  No  Homicidal Thoughts:   No  Memory:  Immediate;   Fair Recent;   Fair  Judgement:  Fair  Insight:  Fair  Psychomotor Activity:    Concentration:  Concentration: Fair and Attention Span: Fair  Recall:  [place]  Language: Fair  Akathisia:    Handed:  Right  AIMS (if indicated):  Minimal distress  Assets:  Desire for Improvement Social Support  ADL's:  Intact  Cognition: WNL  Sleep:  fair    Treatment Plan Summary: Medication management and Plan as follows   1. Bipolar per history: stable, baseline continue depakote no side effects reported Plan for labwork in next 3 months 2. Anxiety; not worse. Provided suppotive therapy 3. Sleep: doing fair Renewed meds. Fu 4-103m I discussed the assessment and treatment plan with the patient. The patient was provided an opportunity to ask questions and all were answered. The patient agreed with the plan and demonstrated an understanding of the instructions.   The patient was advised to call back or seek an in-person evaluation if the symptoms worsen or if the condition fails to improve as anticipated.  I provided 15 minutes of non-face-to-face time during this encounter.

## 2019-08-12 ENCOUNTER — Ambulatory Visit (HOSPITAL_COMMUNITY): Payer: Medicare HMO | Admitting: Psychiatry

## 2019-09-17 ENCOUNTER — Telehealth (HOSPITAL_COMMUNITY): Payer: Self-pay

## 2019-09-17 NOTE — Telephone Encounter (Signed)
Dr. Levan Hurst at [facility] called stating patient is off her medication and she needs to be seen by Dr. Gilmore Laroche. He states she is not suicidal or homicidal. I called patient to make a sooner appt. No answer and mailbox was full.

## 2019-09-29 ENCOUNTER — Emergency Department (HOSPITAL_COMMUNITY)
Admission: EM | Admit: 2019-09-29 | Discharge: 2019-09-30 | Disposition: A | Discharge status: Other Acute Inpatient Hospital | Payer: Medicare HMO | Attending: Anesthesiology | Admitting: Anesthesiology

## 2019-09-29 ENCOUNTER — Ambulatory Visit (HOSPITAL_COMMUNITY): Payer: Medicare HMO | Admitting: Psychiatry

## 2019-09-29 ENCOUNTER — Encounter (HOSPITAL_COMMUNITY): Payer: Self-pay

## 2019-09-29 ENCOUNTER — Other Ambulatory Visit: Payer: Self-pay

## 2019-09-29 DIAGNOSIS — F310 Bipolar disorder, current episode hypomanic: Secondary | ICD-10-CM

## 2019-09-29 DIAGNOSIS — I10 Essential (primary) hypertension: Secondary | ICD-10-CM | POA: Insufficient documentation

## 2019-09-29 DIAGNOSIS — Z79899 Other long term (current) drug therapy: Secondary | ICD-10-CM | POA: Insufficient documentation

## 2019-09-29 DIAGNOSIS — R451 Restlessness and agitation: Secondary | ICD-10-CM | POA: Diagnosis not present

## 2019-09-29 DIAGNOSIS — Z7984 Long term (current) use of oral hypoglycemic drugs: Secondary | ICD-10-CM | POA: Insufficient documentation

## 2019-09-29 DIAGNOSIS — F319 Bipolar disorder, unspecified: Secondary | ICD-10-CM | POA: Diagnosis not present

## 2019-09-29 DIAGNOSIS — Z20822 Contact with and (suspected) exposure to covid-19: Secondary | ICD-10-CM | POA: Insufficient documentation

## 2019-09-29 DIAGNOSIS — R4182 Altered mental status, unspecified: Secondary | ICD-10-CM | POA: Diagnosis present

## 2019-09-29 DIAGNOSIS — E119 Type 2 diabetes mellitus without complications: Secondary | ICD-10-CM | POA: Diagnosis not present

## 2019-09-29 HISTORY — DX: Essential (primary) hypertension: I10

## 2019-09-29 HISTORY — DX: Bipolar disorder, unspecified: F31.9

## 2019-09-29 LAB — URINALYSIS, ROUTINE W REFLEX MICROSCOPIC
Bacteria, UA: NONE SEEN
Bilirubin Urine: NEGATIVE
Glucose, UA: NEGATIVE mg/dL
Hgb urine dipstick: NEGATIVE
Ketones, ur: 5 mg/dL — AB
Nitrite: NEGATIVE
Protein, ur: NEGATIVE mg/dL
Specific Gravity, Urine: 1.023 (ref 1.005–1.030)
pH: 6 (ref 5.0–8.0)

## 2019-09-29 MED ORDER — DIVALPROEX SODIUM 500 MG PO DR TAB
1000.0000 mg | DELAYED_RELEASE_TABLET | Freq: Every day | ORAL | Status: DC
  Administered 2019-09-29: 1000 mg via ORAL
  Filled 2019-09-29: qty 2

## 2019-09-29 MED ORDER — LORAZEPAM 1 MG PO TABS: 1.0000 mg | ORAL_TABLET | Freq: Two times a day (BID) | ORAL | Status: DC | PRN

## 2019-09-29 MED ORDER — NAPROXEN 375 MG PO TABS
375.0000 mg | ORAL_TABLET | Freq: Two times a day (BID) | ORAL | Status: DC
  Administered 2019-09-30: 375 mg via ORAL
  Filled 2019-09-29 (×2): qty 1

## 2019-09-29 NOTE — Progress Notes (Signed)
Received Heather Marquez and her husband from triage. She was assisted with changing into scrubs. She is cognitively disorganized with racing thoughts. Her husband assisted with the admission process and helped  calm her down before he was escorted to the front door. The sitter remained at the bedside. After she talked with TTS, she remained awake throughout the morning hours. She talked to herself, made numerous trips to the bathroom, made several requests and  kept busy in her room walking around and tinkling with the bed.

## 2019-09-29 NOTE — ED Provider Notes (Signed)
[facility]-EMERGENCY DEPT Provider Note   CSN: [id number] Arrival date & time: 09/29/19  2104     History Chief Complaint  Patient presents with  . Altered Mental Status    cognitive disorganized    Heather Marquez is a 59 y.o. female.   59 year old female with a history of bipolar disorder presents to the emergency department for psychiatric evaluation.  She states that she was recommended to be assessed by her psychiatrist as well as her primary care doctor.  She states that she has been compliant with her medications.  Feels that her medications need to be changed, but is unable to specify why she believes this is the case.  She states that "nothing is wrong with me".  She is very agitated.  Just was discharged from [facility] following placement; was IVC'd at [place] on 09/18/19.  Denies SI/HI.  Presents today with her husband.  The history is provided by the patient. No language interpreter was used.       Past Medical History:  Diagnosis Date  . Bipolar 1 disorder (HCC)    x 21 years  . Hypertension    unknown    Patient Active Problem List   Diagnosis Date Noted  . Essential hypertension 01/15/2017  . Diabetes (HCC) 01/15/2017    History reviewed. No pertinent surgical history.   OB History   No obstetric history on file.     History reviewed. No pertinent family history.  Social History   Tobacco Use  . Smoking status: Never Smoker  . Smokeless tobacco: Never Used  Substance Use Topics  . Alcohol use: No  . Drug use: No    Home Medications Prior to Admission medications   Medication Sig Start Date End Date Taking? Authorizing Provider  amLODipine (NORVASC) 10 MG tablet Take 10 mg by mouth daily.  05/19/18  Yes [provider]  diclofenac (VOLTAREN) 75 MG EC tablet Take 75 mg by mouth daily as needed for mild pain.  08/19/19  Yes [provider]  divalproex (DEPAKOTE) 500 MG DR tablet Take 2 tablets (1,000 mg  total) by mouth at bedtime. 08/11/19  Yes Thresa Ross, MD  labetalol (NORMODYNE) 200 MG tablet Take 200 mg by mouth 2 (two) times daily.   Yes [provider]  losartan (COZAAR) 25 MG tablet Take 25 mg by mouth daily.   Yes [provider]  rosuvastatin (CRESTOR) 5 MG tablet Take 5 mg by mouth daily.  10/21/17  Yes [provider]    Allergies    Mango flavor  Review of Systems   Review of Systems  Ten systems reviewed and are negative for acute change, except as noted in the HPI.    Physical Exam Updated Vital Signs BP 130/87 (BP Location: Left Arm)   Pulse (!) 114   Temp 98.9 F (37.2 C) (Oral)   Resp 19   Ht 5\' 5"  (1.651 m)   Wt 69.9 kg   SpO2 100%   BMI 25.63 kg/m   Physical Exam Vitals and nursing note reviewed.  Constitutional:      General: She is not in acute distress.    Appearance: She is well-developed. She is not diaphoretic.     Comments: Nontoxic appearing  HENT:     Head: Normocephalic and atraumatic.  Eyes:     General: No scleral icterus.    Conjunctiva/sclera: Conjunctivae normal.  Pulmonary:     Effort: Pulmonary effort is normal. No respiratory distress.  Musculoskeletal:        General: Normal range of motion.     Cervical back: Normal range of motion.  Skin:    General: Skin is warm and dry.     Coloration: Skin is not pale.     Findings: No erythema or rash.  Neurological:     Mental Status: She is alert and oriented to person, place, and time.  Psychiatric:        Mood and Affect: Affect is blunt.        Speech: Speech is rapid and pressured.        Behavior: Behavior is agitated.        Thought Content: Thought content does not include homicidal or suicidal ideation.     ED Results / Procedures / Treatments   Labs (all labs ordered are listed, but only abnormal results are displayed) Labs Reviewed  CBC WITH DIFFERENTIAL/PLATELET - Abnormal; Notable for the following components:      Result Value    Hemoglobin 11.0 (*)    HCT 33.9 (*)    All other components within normal limits  COMPREHENSIVE METABOLIC PANEL - Abnormal; Notable for the following components:   Glucose, Bld 154 (*)    BUN 24 (*)    Creatinine, Ser 1.14 (*)    Total Bilirubin 0.2 (*)    GFR calc non Af Amer 54 (*)    All other components within normal limits  SALICYLATE LEVEL - Abnormal; Notable for the following components:   Salicylate Lvl <7.0 (*)    All other components within normal limits  ACETAMINOPHEN LEVEL - Abnormal; Notable for the following components:   Acetaminophen (Tylenol), Serum <10 (*)    All other components within normal limits  URINALYSIS, ROUTINE W REFLEX MICROSCOPIC - Abnormal; Notable for the following components:   Ketones, ur 5 (*)    Leukocytes,Ua TRACE (*)    All other components within normal limits  RESPIRATORY PANEL BY RT PCR (FLU A&B, COVID)  ETHANOL  RAPID URINE DRUG SCREEN, HOSP PERFORMED    EKG None  Radiology No results found.  Procedures Procedures (including critical care time)  Medications Ordered in ED Medications  divalproex (DEPAKOTE) DR tablet 1,000 mg (1,000 mg Oral Given 09/29/19 2340)  naproxen (NAPROSYN) tablet 375 mg (375 mg Oral Not Given 09/29/19 2341)  LORazepam (ATIVAN) tablet 1 mg (has no administration in time range)  labetalol (NORMODYNE) tablet 200 mg (has no administration in time range)  losartan (COZAAR) tablet 25 mg (has no administration in time range)  rosuvastatin (CRESTOR) tablet 5 mg (has no administration in time range)  amLODipine (NORVASC) tablet 10 mg (has no administration in time range)    ED Course  I have reviewed the triage vital signs and the nursing notes.  Pertinent labs & imaging results that were available during my care of the patient were reviewed by me and considered in my medical decision making (see chart for details).    MDM Rules/Calculators/A&P                      59 year old female presenting voluntarily  for psychiatric evaluation.  Advised to come by her PCP and therapist.  Denies SI/HI.  She has a history of bipolar 1 disorder.  Medically cleared in the ED.  TTS has assessed the patient and recommend inpatient treatment.  Will refer out for placement.  Disposition to be determined by oncoming ED provider.   Final Clinical Impression(s) / ED  Diagnoses Final diagnoses:  Bipolar 1 disorder [facility])    Rx / DC Orders ED Discharge Orders    None       Antonietta Breach, PA-C 09/30/19 2897    Margette Fast, MD 10/01/19 1726

## 2019-09-30 ENCOUNTER — Inpatient Hospital Stay
Admission: RE | Admit: 2019-09-30 | Discharge: 2019-10-06 | DRG: 885 | Disposition: A | Discharge status: Home / Self Care | Payer: Medicare HMO | Source: Intra-hospital | Attending: Psychiatry | Admitting: Psychiatry

## 2019-09-30 ENCOUNTER — Encounter: Payer: Self-pay | Admitting: Psychiatry

## 2019-09-30 ENCOUNTER — Encounter (HOSPITAL_COMMUNITY): Payer: Self-pay | Admitting: Registered Nurse

## 2019-09-30 DIAGNOSIS — I452 Bifascicular block: Secondary | ICD-10-CM | POA: Diagnosis present

## 2019-09-30 DIAGNOSIS — I10 Essential (primary) hypertension: Secondary | ICD-10-CM | POA: Diagnosis present

## 2019-09-30 DIAGNOSIS — Z5181 Encounter for therapeutic drug level monitoring: Secondary | ICD-10-CM | POA: Diagnosis not present

## 2019-09-30 DIAGNOSIS — F310 Bipolar disorder, current episode hypomanic: Secondary | ICD-10-CM

## 2019-09-30 DIAGNOSIS — M1990 Unspecified osteoarthritis, unspecified site: Secondary | ICD-10-CM | POA: Diagnosis present

## 2019-09-30 DIAGNOSIS — G4700 Insomnia, unspecified: Secondary | ICD-10-CM | POA: Diagnosis present

## 2019-09-30 DIAGNOSIS — Z79899 Other long term (current) drug therapy: Secondary | ICD-10-CM

## 2019-09-30 DIAGNOSIS — F312 Bipolar disorder, current episode manic severe with psychotic features: Secondary | ICD-10-CM | POA: Diagnosis present

## 2019-09-30 DIAGNOSIS — E785 Hyperlipidemia, unspecified: Secondary | ICD-10-CM | POA: Diagnosis present

## 2019-09-30 DIAGNOSIS — F319 Bipolar disorder, unspecified: Secondary | ICD-10-CM | POA: Diagnosis not present

## 2019-09-30 HISTORY — DX: Bipolar disorder, current episode hypomanic: F31.0

## 2019-09-30 LAB — RESPIRATORY PANEL BY RT PCR (FLU A&B, COVID)
Influenza A by PCR: NEGATIVE
Influenza B by PCR: NEGATIVE
SARS Coronavirus 2 by RT PCR: NEGATIVE

## 2019-09-30 LAB — RAPID URINE DRUG SCREEN, HOSP PERFORMED
Amphetamines: NOT DETECTED
Barbiturates: NOT DETECTED
Benzodiazepines: NOT DETECTED
Cocaine: NOT DETECTED
Opiates: NOT DETECTED
Tetrahydrocannabinol: NOT DETECTED

## 2019-09-30 LAB — CBC WITH DIFFERENTIAL/PLATELET
Abs Immature Granulocytes: 0.02 10*3/uL (ref 0.00–0.07)
Basophils Absolute: 0 10*3/uL (ref 0.0–0.1)
Basophils Relative: 0 %
Eosinophils Absolute: 0.1 10*3/uL (ref 0.0–0.5)
Eosinophils Relative: 1 %
HCT: 33.9 % — ABNORMAL LOW (ref 36.0–46.0)
Hemoglobin: 11 g/dL — ABNORMAL LOW (ref 12.0–15.0)
Immature Granulocytes: 0 %
Lymphocytes Relative: 36 %
Lymphs Abs: 3 10*3/uL (ref 0.7–4.0)
MCH: 27.9 pg (ref 26.0–34.0)
MCHC: 32.4 g/dL (ref 30.0–36.0)
MCV: 86 fL (ref 80.0–100.0)
Monocytes Absolute: 0.7 10*3/uL (ref 0.1–1.0)
Monocytes Relative: 9 %
Neutro Abs: 4.5 10*3/uL (ref 1.7–7.7)
Neutrophils Relative %: 54 %
Platelets: 184 10*3/uL (ref 150–400)
RBC: 3.94 MIL/uL (ref 3.87–5.11)
RDW: 13.5 % (ref 11.5–15.5)
WBC: 8.3 10*3/uL (ref 4.0–10.5)
nRBC: 0 % (ref 0.0–0.2)

## 2019-09-30 LAB — COMPREHENSIVE METABOLIC PANEL
ALT: 24 U/L (ref 0–44)
AST: 29 U/L (ref 15–41)
Albumin: 3.8 g/dL (ref 3.5–5.0)
Alkaline Phosphatase: 44 U/L (ref 38–126)
Anion gap: 10 (ref 5–15)
BUN: 24 mg/dL — ABNORMAL HIGH (ref 6–20)
CO2: 27 mmol/L (ref 22–32)
Calcium: 9.4 mg/dL (ref 8.9–10.3)
Chloride: 104 mmol/L (ref 98–111)
Creatinine, Ser: 1.14 mg/dL — ABNORMAL HIGH (ref 0.44–1.00)
GFR calc Af Amer: >60 mL/min (ref >60)
GFR calc non Af Amer: 54 mL/min — ABNORMAL LOW (ref >60)
Glucose, Bld: 154 mg/dL — ABNORMAL HIGH (ref 70–99)
Potassium: 3.8 mmol/L (ref 3.5–5.1)
Sodium: 141 mmol/L (ref 135–145)
Total Bilirubin: 0.2 mg/dL — ABNORMAL LOW (ref 0.3–1.2)
Total Protein: 7.4 g/dL (ref 6.5–8.1)

## 2019-09-30 LAB — ACETAMINOPHEN LEVEL: Acetaminophen (Tylenol), Serum: <10 ug/mL — ABNORMAL LOW (ref 10–30)

## 2019-09-30 LAB — SALICYLATE LEVEL: Salicylate Lvl: <7 mg/dL — ABNORMAL LOW (ref 7.0–30.0)

## 2019-09-30 LAB — ETHANOL: Alcohol, Ethyl (B): <10 mg/dL (ref <10)

## 2019-09-30 LAB — VALPROIC ACID LEVEL: Valproic Acid Lvl: 118 ug/mL — ABNORMAL HIGH (ref 50.0–100.0)

## 2019-09-30 MED ORDER — MAGNESIUM HYDROXIDE 400 MG/5ML PO SUSP: 30.0000 mL | Freq: Every day | ORAL | Status: DC | PRN

## 2019-09-30 MED ORDER — ACETAMINOPHEN 325 MG PO TABS: 650.0000 mg | ORAL_TABLET | Freq: Four times a day (QID) | ORAL | Status: DC | PRN

## 2019-09-30 MED ORDER — AMLODIPINE BESYLATE 5 MG PO TABS
10.0000 mg | ORAL_TABLET | Freq: Every day | ORAL | Status: DC
  Administered 2019-09-30: 10 mg via ORAL
  Filled 2019-09-30: qty 2

## 2019-09-30 MED ORDER — ROSUVASTATIN CALCIUM 5 MG PO TABS
5.0000 mg | ORAL_TABLET | Freq: Every day | ORAL | Status: DC
  Filled 2019-09-30: qty 1

## 2019-09-30 MED ORDER — OLANZAPINE 5 MG PO TBDP: 5.0000 mg | ORAL_TABLET | Freq: Every day | ORAL | Status: DC

## 2019-09-30 MED ORDER — LOSARTAN POTASSIUM 25 MG PO TABS
25.0000 mg | ORAL_TABLET | Freq: Every day | ORAL | Status: DC
  Administered 2019-09-30: 25 mg via ORAL
  Filled 2019-09-30: qty 1

## 2019-09-30 MED ORDER — LABETALOL HCL 200 MG PO TABS
200.0000 mg | ORAL_TABLET | Freq: Two times a day (BID) | ORAL | Status: DC
  Administered 2019-09-30: 200 mg via ORAL
  Filled 2019-09-30 (×2): qty 1

## 2019-09-30 MED ORDER — ALUM & MAG HYDROXIDE-SIMETH 200-200-20 MG/5ML PO SUSP: 30.0000 mL | ORAL | Status: DC | PRN
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
## 2019-09-30 NOTE — Consult Note (Addendum)
  Heather Marquez, 59 y.o., female patient seen via tele psych by this provider, Dr. Lucianne Muss; and chart reviewed on 09/30/19.  On evaluation Briannah Lona was standing at sink washing hands then asking staff for paper towel.  Patient had to be redirected several times to speak with doctor via tele psych.  "Oh it's virtual; everything is virtual now days."  Patient reporting that she lives with only her husband "All of my children are in [place] I want to get a bigger house big enough for all of [place] to live in; A long time ago it was 11 of [place] and I was diagnosed with bipolar; they wanted me to evict my older children.  I don't love my younger children more than my older ones; I love them all the same."  When asked what happened for patient to come to hospital; patient responded "The doctor told my husband for me to be there on Monday; I guess they forgot it was [employer] day and then called and told me to come the next day.  Why tell me to come on Monday then tell me tomorrow.  They always told me don't put off for tomorrow what you can do today."  Patient states that she is sleeping "I rest a little then get up a little it's the spirt driving me.  I slept good last night.  At home right before bed there are cloths and stuff everywhere; I try to straighten up.  I'm tired.  It's good I was here last night."  Patient denies suicidal/homicidal ideation, psychosis and paranoia.  "there is nothing wrong with me.  They told me to come in volentary and that's what I did.  Make sure we understand each other; I want to make it clear.  Which way do you want me to go?  I can go left or I can go right.  I can go [name] or I can go [place]."  Patient gave permission to speak to her husband for collateral information.  "What ever he wants me to do I'll do.  He knows me better than anybody.  When I get home we getting married.  When 2 flesh become 1 flesh."   During evaluation Heather Marquez is alert/oriented x 4;  calm/cooperative; disorganized, manic.  Speech pressured. Francis Dowse does not appear to be responding to internal/external stimuli or delusional thoughts.  Patient denies suicidal/self-harm/homicidal ideation, psychosis, and paranoia. Patient currently hypomanic.     Recommendations:  Zyprexa zydis 5 mg Q hs added for mood stabilization; Valproic acid level ordered.     Disposition: Recommend psychiatric Inpatient admission when medically cleared.    Jessice Madill B. Solimar Maiden, NP

## 2019-09-30 NOTE — BH Assessment (Signed)
Patient has been accepted to [facility].  Accepting physician is Dr. Brien Few.  Attending  Physician will be Dr. Toni Amend.  Patient has been assigned to room 315, by [facility] Charge Nurse Pittman F.   Call report to [phone].  Representative/Transfer Coordinator is Riyaan Heroux Patient pre-admitted by [facility] Patient Access Donella Stade.)  [facility] ER Staff Maisie Fus H., TTS) made aware of acceptance.

## 2019-09-30 NOTE — ED Notes (Signed)
Pt has been restless, disorganized, needs redirection. Pt currently sleepy, rambling, slurred speech.

## 2019-09-30 NOTE — ED Provider Notes (Signed)
  Physical Exam  BP (!) (P) 108/56 (BP Location: Left Arm)   Pulse (P) 76   Temp (P) 98.4 F (36.9 C) (Oral)   Resp (P) 16   Ht 5\' 5"  (1.651 m)   Wt 69.9 kg   SpO2 (P) 98%   BMI 25.63 kg/m   Physical Exam  ED Course/Procedures     Procedures  MDM  Accepted  regional by Dr. .  Reevaluated and cleared for discharge/transfer       Lucretia Kern, MD 09/30/19 [phone]

## 2019-09-30 NOTE — ED Notes (Signed)
Pt has resulted negative Covid test per [facility]

## 2019-09-30 NOTE — Progress Notes (Signed)
Admission Report From [facility] Heather Marquez   D: Admission Note: Pt appeared depressed  With  a euphoric  affect.  Pt  denies SI / AVH at this time.  Patient reports she doesn't know why she is here.  Patient came from [place]  Where her husband  Had her IVC.  Voice of her medication needed to be changed . Recently discharged from [facility]  On September 18 2019. At present patient disorganized with tangential speech.  Stated people where messing  With her .  Pt is redirectable and cooperative with assessment.       A: Pt admitted to unit per protocol, skin assessment and search done and no contraband found with Megan .  Pt  educated on therapeutic milieu rules. Pt was introduced to milieu by nursing staff.    R: Pt was receptive to education about the milieu .  15 min safety checks started. Clinical research associate offered support

## 2019-09-30 NOTE — ED Notes (Signed)
Pt DC of unit to [facility] behavioral health. Pt alert,  Cooperative, confused. Disorganized, no s/s of distress. DC information given to sheriff. Belongings given to sheriff .  Pt ambulatory off the unit, escorted be sheriff. Pt transported by sheriff.

## 2019-09-30 NOTE — ED Provider Notes (Signed)
Emergency Medicine Observation Re-evaluation Note  Heather Marquez is a 59 y.o. female, seen on rounds today.  Pt initially presented to the ED for complaints of Altered Mental Status (cognitive disorganized) Currently, the patient is calm.  Physical Exam  BP (!) 157/95 (BP Location: Left Arm)   Pulse 93   Temp (!) 97.4 F (36.3 C) (Oral)   Resp 18   Ht 5\' 5"  (1.651 m)   Wt 69.9 kg   SpO2 100%   BMI 25.63 kg/m  Physical Exam  ED Course / MDM  EKG:    I have reviewed the labs performed to date as well as medications administered while in observation.  Recent changes in the last 24 hours include TTS eval. Plan  Current plan is for placement. Patient is not under full IVC at this time.   , MD 09/30/19 [phone]

## 2019-09-30 NOTE — Tx Team (Signed)
Initial Treatment Plan 09/30/2019 6:03 PM [name] [id number]    PATIENT STRESSORS: Financial difficulties Marital or family conflict Medication change or noncompliance   PATIENT STRENGTHS: Active sense of humor Average or above average intelligence Capable of independent living Motivation for treatment/growth   PATIENT IDENTIFIED PROBLEMS: Mania 09/30/19  Non compliant with medications 09/30/19  Psychosis 09/30/19                 DISCHARGE CRITERIA:  Ability to meet basic life and health needs Adequate post-discharge living arrangements Improved stabilization in mood, thinking, and/or behavior  PRELIMINARY DISCHARGE PLAN: Outpatient therapy Return to previous living arrangement  PATIENT/FAMILY INVOLVEMENT: This treatment plan has been presented to and reviewed with the patient, Heather Marquez, and/or family member,  .  The patient and family have been given the opportunity to ask questions and make suggestions.  Crist Infante, RN 09/30/2019, 6:03 PM

## 2019-09-30 NOTE — BHH Group Notes (Signed)
BHH Group Notes:  (Nursing/MHT/Case Management/Adjunct)  Date:  09/30/2019  Time:  9:19 PM  Type of Therapy:  Group Therapy  Participation Level:  Active  Participation Quality:  Sharing  Affect:  Appropriate  Cognitive:  Appropriate  Insight:  Good  Engagement in Group:  Engaged  Modes of Intervention:  Discussion  Summary of Progress/Problems:  Heather Marquez 09/30/2019, 9:19 PM

## 2019-09-30 NOTE — Plan of Care (Signed)
New Admission   Problem: Education: Goal: Knowledge of [facility] Education information/materials will improve Outcome: Not Progressing   Problem: Coping: Goal: Ability to verbalize frustrations and anger appropriately will improve Outcome: Not Progressing Goal: Ability to demonstrate self-control will improve Outcome: Not Progressing   Problem: Activity: Goal: Will verbalize the importance of balancing activity with adequate rest periods Outcome: Not Progressing   Problem: Health Behavior/Discharge Planning: Goal: Compliance with prescribed medication regimen will improve Outcome: Not Progressing   Problem: Role Relationship: Goal: Ability to communicate needs accurately will improve Outcome: Not Progressing Goal: Ability to interact with others will improve Outcome: Not Progressing   Problem: Education: Goal: Knowledge of the prescribed therapeutic regimen will improve Outcome: Not Progressing   Problem: Self-Concept: Goal: Level of anxiety will decrease Outcome: Not Progressing

## 2019-09-30 NOTE — BH Assessment (Signed)
BHH Assessment Progress Note  Per Nelly Rout, MD, this pt requires psychiatric hospitalization.  Dr Lucianne Muss also finds that pt meets criteria for IVC, which she has initiated.  IVC documents have been faxed to [facility], and at 10:37 Hortense Ramal confirms receipt.  He has since faxed Findings and Custody Order to this Clinical research associate.  At 11:38 I called [employer] and spoke to [employer], who took demographic information, agreeing to dispatch law enforcement to fill out Return of Service.  [place] police then presented at [facility], completing Return of Service, after which IVC documents were faxed to [facility] at [phone].  Robinette Haines, Counselor, confirms receipt, noting that pt has been accepted to [facility] by Dr Cindi Carbon to Rm 315.  Pt's nurse, Waynetta Sandy, has been notified, and agrees to call report to [phone].  Pt is to be transported via [facility].   Doylene Canning, [place] Behavioral Health Coordinator [phone]

## 2019-09-30 NOTE — Progress Notes (Signed)
I walked in the patient's room and the mattress was off the bed. Pt said that it needed "to be on the black side because a black man died in here." She was walking aroud naked washing up with just her shoes on. I told her that it was time to eat. I put the mattress back on the bed. Torrie Mayers RN

## 2019-09-30 NOTE — Plan of Care (Signed)
Pt denies depression, anxiety, SI, HI and AVH. Pt was educated on care plan and verbalizes understanding. Torrie Mayers RN Problem: Education: Goal: Knowledge of [facility] Education information/materials will improve Outcome: Not Progressing   Problem: Coping: Goal: Ability to verbalize frustrations and anger appropriately will improve Outcome: Not Progressing Goal: Ability to demonstrate self-control will improve Outcome: Not Progressing   Problem: Safety: Goal: Periods of time without injury will increase Outcome: Not Progressing   Problem: Activity: Goal: Will verbalize the importance of balancing activity with adequate rest periods Outcome: Not Progressing   Problem: Education: Goal: Will be free of psychotic symptoms Outcome: Not Progressing   Problem: Health Behavior/Discharge Planning: Goal: Compliance with prescribed medication regimen will improve Outcome: Not Progressing   Problem: Role Relationship: Goal: Ability to communicate needs accurately will improve Outcome: Not Progressing Goal: Ability to interact with others will improve Outcome: Not Progressing   Problem: Education: Goal: Knowledge of the prescribed therapeutic regimen will improve Outcome: Not Progressing   Problem: Self-Concept: Goal: Level of anxiety will decrease Outcome: Not Progressing

## 2019-10-01 DIAGNOSIS — F312 Bipolar disorder, current episode manic severe with psychotic features: Secondary | ICD-10-CM

## 2019-10-01 DIAGNOSIS — Z5181 Encounter for therapeutic drug level monitoring: Secondary | ICD-10-CM

## 2019-10-01 LAB — HEMOGLOBIN A1C
Hgb A1c MFr Bld: 5.6 % (ref 4.8–5.6)
Mean Plasma Glucose: 114.02 mg/dL

## 2019-10-01 LAB — LIPID PANEL
Cholesterol: 160 mg/dL (ref 0–200)
HDL: 71 mg/dL (ref >40)
LDL Cholesterol: 80 mg/dL (ref 0–99)
Total CHOL/HDL Ratio: 2.3 RATIO
Triglycerides: 46 mg/dL (ref <150)
VLDL: 9 mg/dL (ref 0–40)

## 2019-10-01 LAB — TSH: TSH: 2.048 u[IU]/mL (ref 0.350–4.500)

## 2019-10-01 MED ORDER — QUETIAPINE FUMARATE 100 MG PO TABS
100.0000 mg | ORAL_TABLET | Freq: Every day | ORAL | Status: DC
  Administered 2019-10-01: 100 mg via ORAL
  Filled 2019-10-01: qty 1

## 2019-10-01 MED ORDER — AMLODIPINE BESYLATE 5 MG PO TABS
10.0000 mg | ORAL_TABLET | Freq: Every day | ORAL | Status: DC
  Administered 2019-10-01 – 2019-10-06 (×6): 10 mg via ORAL
  Filled 2019-10-01 (×6): qty 2

## 2019-10-01 MED ORDER — DIVALPROEX SODIUM 500 MG PO DR TAB
1000.0000 mg | DELAYED_RELEASE_TABLET | Freq: Every day | ORAL | Status: DC
  Administered 2019-10-01 – 2019-10-05 (×5): 1000 mg via ORAL
  Filled 2019-10-01 (×5): qty 2

## 2019-10-01 NOTE — Progress Notes (Signed)
Recreation Therapy Notes   Date: 10/01/2019  Time: 9:30 am   Location: Craft room   Behavioral response: N/A   Intervention Topic: Relaxation   Discussion/Intervention: Patient did not attend group.   Clinical Observations/Feedback:  Patient did not attend group.   Cory Rama LRT/CTRS        Aubryn Spinola 10/01/2019 11:44 AM

## 2019-10-01 NOTE — Progress Notes (Signed)
D: Bipolar   A:Patient  Remains manic . Continues to clean unit  And her room . Noted to take several baths this shift and wash clothes .  Noted to walk around unit singing  [employer].  Limited interaction with her  Peers.  Compliant  With medication given . Patient   Noted to request  Items throughout shift. Staff has had to limit. Supplies . No insight into behaviors. Patient stated slept good last night .Stated appetite good and energy level  Is normal.  Patient talking to herself through out shift.  Encourage patient participation with unit programming . Instruction  Given on  Medication , verbalize understanding.  R: Voice no other concerns. Staff continue to monitor

## 2019-10-01 NOTE — BHH Suicide Risk Assessment (Signed)
[facility] Admission Suicide Risk Assessment   Nursing information obtained from:  Patient Demographic factors:  Low socioeconomic status, Unemployed Current Mental Status:  NA Loss Factors:  Financial problems / change in socioeconomic status Historical Factors:  NA Risk Reduction Factors:  Living with another person, especially a relative  Total Time spent with patient: 1 hour Principal Problem: Severe manic bipolar 1 disorder with psychotic behavior (Heather Marquez) Diagnosis:  Principal Problem:   Severe manic bipolar 1 disorder with psychotic behavior (Heather Marquez) Active Problems:   Essential hypertension   Bipolar 1 disorder (Heather Marquez)  Subjective Data: Patient seen and chart reviewed.  59 year old woman with a history of bipolar disorder presented in referral from [name] long in a manic state.  Patient is hyperverbal emotionally labile disorganized in her thinking but denies any suicidal thoughts does not talk about feeling hopeless does not make any suicidal like statements and there is no report of recent suicidality.  Continued Clinical Symptoms:  Alcohol Use Disorder Identification Test Final Score (AUDIT): 0 The "Alcohol Use Disorders Identification Test", Guidelines for Use in Primary Care, Second Edition.  World Pharmacologist [facility]). Score between 0-7:  no or low risk or alcohol related problems. Score between 8-15:  moderate risk of alcohol related problems. Score between 16-19:  high risk of alcohol related problems. Score 20 or above:  warrants further diagnostic evaluation for alcohol dependence and treatment.   CLINICAL FACTORS:   Bipolar Disorder:   Mixed State   Musculoskeletal: Strength & Muscle Tone: within normal limits Gait & Station: normal Patient leans: N/A  Psychiatric Specialty Exam: Physical Exam  Nursing note and vitals reviewed. Constitutional: She appears well-developed and well-nourished.  HENT:  Head: Normocephalic and atraumatic.  Eyes: Pupils are equal, round,  and reactive to light. Conjunctivae are normal.  Cardiovascular: Regular rhythm and normal heart sounds.  Respiratory: Effort normal.  GI: Soft.  Musculoskeletal:        General: Normal range of motion.     Cervical back: Normal range of motion.  Neurological: She is alert.  Skin: Skin is warm and dry.  Psychiatric: Her affect is labile. Her speech is rapid and/or pressured and tangential. She is agitated. She is not aggressive. Cognition and memory are impaired. She expresses impulsivity and inappropriate judgment. She expresses no homicidal and no suicidal ideation.    Review of Systems  Constitutional: Negative.   HENT: Negative.   Eyes: Negative.   Respiratory: Negative.   Cardiovascular: Negative.   Gastrointestinal: Negative.   Musculoskeletal: Negative.   Skin: Negative.   Neurological: Negative.   Psychiatric/Behavioral: Positive for sleep disturbance. The patient is nervous/anxious.     Blood pressure (!) 146/71, pulse (!) 43, temperature 97.7 F (36.5 C), temperature source Oral, resp. rate 17, height 5\' 4"  (1.626 m), weight 69.4 kg, SpO2 100 %.Body mass index is 26.26 kg/m.  General Appearance: Casual  Eye Contact:  Fair  Speech:  Pressured  Volume:  Increased  Mood:  Anxious  Affect:  Inappropriate and Labile  Thought Process:  Disorganized  Orientation:  Full (Time, Place, and Person)  Thought Content:  Illogical, Paranoid Ideation, Rumination and Tangential  Suicidal Thoughts:  No  Homicidal Thoughts:  No  Memory:  Immediate;   Fair Recent;   Fair Remote;   Fair  Judgement:  Impaired  Insight:  Shallow  Psychomotor Activity:  Restlessness  Concentration:  Concentration: Poor  Recall:  [employer] of Knowledge:  Fair  Language:  Fair  Akathisia:  No  Handed:  Right  AIMS (if indicated):     Assets:  Desire for Improvement Housing Physical Health Resilience Social Support  ADL's:  Impaired  Cognition:  Impaired,  Mild  Sleep:  Number of Hours:  0(Awake all night )      COGNITIVE FEATURES THAT CONTRIBUTE TO RISK:  Loss of executive function    SUICIDE RISK:   Minimal: No identifiable suicidal ideation.  Patients presenting with no risk factors but with morbid ruminations; may be classified as minimal risk based on the severity of the depressive symptoms  PLAN OF CARE: Continue 15-minute checks.  Reviewed past treatment.  Initiate appropriate medication for manic presentation.  Engage in group counseling.  Stay in contact with family.  Assess dangerousness prior to discharge  I certify that inpatient services furnished can reasonably be expected to improve the patient's condition.   Mordecai Rasmussen, MD 10/01/2019, 12:43 PM

## 2019-10-01 NOTE — Progress Notes (Deleted)
Care of patient taken over at 2300, resting in bed quietly at this time, awaken for post fall vitals, no issues noted on shift. Patient seems to be in bed resting quietly 

## 2019-10-01 NOTE — Tx Team (Addendum)
Interdisciplinary Treatment and Diagnostic Plan Update  10/01/2019 Time of Session: Heather Marquez MRN: [id number]  Principal Diagnosis: <principal problem not specified>  Secondary Diagnoses: Active Problems:   Bipolar 1 disorder (HCC)   Current Medications:  Current Facility-Administered Medications  Medication Dose Route Frequency Provider Last Rate Last Admin  . acetaminophen (TYLENOL) tablet 650 mg  650 mg Oral Q6H PRN Cristofano, Dorene Ar, MD      . alum & mag hydroxide-simeth (MAALOX/MYLANTA) 200-200-20 MG/5ML suspension 30 mL  30 mL Oral Q4H PRN Cristofano, Paul A, MD      . magnesium hydroxide (MILK OF MAGNESIA) suspension 30 mL  30 mL Oral Daily PRN Cristofano, Dorene Ar, MD       PTA Medications: Medications Prior to Admission  Medication Sig Dispense Refill Last Dose  . amLODipine (NORVASC) 10 MG tablet Take 10 mg by mouth daily.      . diclofenac (VOLTAREN) 75 MG EC tablet Take 75 mg by mouth daily as needed for mild pain.      . divalproex (DEPAKOTE) 500 MG DR tablet Take 2 tablets (1,000 mg total) by mouth at bedtime. 180 tablet 0   . FLAXSEED, LINSEED, PO Take 1 tablet by mouth daily.     Marland Kitchen losartan (COZAAR) 25 MG tablet Take 25 mg by mouth daily.     . rosuvastatin (CRESTOR) 5 MG tablet Take 5 mg by mouth daily.        Patient Stressors: Financial difficulties Marital or family conflict Medication change or noncompliance  Patient Strengths: Active sense of humor Average or above average intelligence Capable of independent living Motivation for treatment/growth  Treatment Modalities: Medication Management, Group therapy, Case management,  1 to 1 session with clinician, Psychoeducation, Recreational therapy.   Physician Treatment Plan for Primary Diagnosis: <principal problem not specified> Long Term Goal(s):     Short Term Goals:    Medication Management: Evaluate patient's response, side effects, and tolerance of medication regimen.  Therapeutic  Interventions: 1 to 1 sessions, Unit Group sessions and Medication administration.  Evaluation of Outcomes: Not Met  Physician Treatment Plan for Secondary Diagnosis: Active Problems:   Bipolar 1 disorder ([place])  Long Term Goal(s):     Short Term Goals:       Medication Management: Evaluate patient's response, side effects, and tolerance of medication regimen.  Therapeutic Interventions: 1 to 1 sessions, Unit Group sessions and Medication administration.  Evaluation of Outcomes: Not Met   RN Treatment Plan for Primary Diagnosis: <principal problem not specified> Long Term Goal(s): Knowledge of disease and therapeutic regimen to maintain health will improve  Short Term Goals: Ability to participate in decision making will improve, Ability to verbalize feelings will improve, Ability to identify and develop effective coping behaviors will improve and Compliance with prescribed medications will improve  Medication Management: RN will administer medications as ordered by provider, will assess and evaluate patient's response and provide education to patient for prescribed medication. RN will report any adverse and/or side effects to prescribing provider.  Therapeutic Interventions: 1 on 1 counseling sessions, Psychoeducation, Medication administration, Evaluate responses to treatment, Monitor vital signs and CBGs as ordered, Perform/monitor CIWA, COWS, AIMS and Fall Risk screenings as ordered, Perform wound care treatments as ordered.  Evaluation of Outcomes: Not Met   LCSW Treatment Plan for Primary Diagnosis: <principal problem not specified> Long Term Goal(s): Safe transition to appropriate next level of care at discharge, Engage patient in therapeutic group addressing interpersonal concerns.  Short Term Goals: Engage patient  in aftercare planning with referrals and resources  Therapeutic Interventions: Assess for all discharge needs, 1 to 1 time with Social worker, Explore available  resources and support systems, Assess for adequacy in community support network, Educate family and significant other(s) on suicide prevention, Complete Psychosocial Assessment, Interpersonal group therapy.  Evaluation of Outcomes: Not Met   Progress in Treatment: Attending groups: No. Participating in groups: No. Taking medication as prescribed: Yes. Toleration medication: Yes. Family/Significant other contact made: No, will contact:  when pt gives consent Patient understands diagnosis: Yes. Discussing patient identified problems/goals with staff: Yes. Medical problems stabilized or resolved: No. Denies suicidal/homicidal ideation: Yes. Issues/concerns per patient self-inventory: No. Other: NA  New problem(s) identified: No, Describe:  None reported  New Short Term/Long Term Goal(s):Attend outpatient treatment, take medication as prescribed, develop and implement healthy coping methods  Patient Goals:  "Make sure I get rest"  Discharge Plan or Barriers: Patient will return home and follow up with outpatient treatment  Reason for Continuation of Hospitalization: Medication stabilization  Estimated Length of Stay:1-7 days  Recreational Therapy: Patient: N/A Patient Goal: Patient will engage in groups without prompting or encouragement from LRT x3 group sessions within 5 recreation therapy group sessions.  Attendees: Patient:[name] 10/01/2019 10:00 AM  Physician: Alethia Berthold 10/01/2019 10:00 AM  Nursing:  10/01/2019 10:00 AM  RN Care Manager: 10/01/2019 10:00 AM  Social Worker: Sanjuana Kava Olivia Moton [place] 10/01/2019 10:00 AM  Recreational Therapist: Isaias Sakai Daphine Loch 10/01/2019 10:00 AM  Other:  10/01/2019 10:00 AM  Other:  10/01/2019 10:00 AM  Other: 10/01/2019 10:00 AM    Scribe for Treatment Team: Yvette Rack, LCSW 10/01/2019 10:00 AM

## 2019-10-01 NOTE — Plan of Care (Signed)
Patient  able to manage  frustration  appropriately  no lost  of control this shift .  Voice of no safety concerns  Thought process remained  Altered . Compliant  with medication staff educate patient  on reason for  medication . Encouraging patient to work on coping , anxiety and decision making .   Problem: Self-Concept: Goal: Level of anxiety will decrease Outcome: Progressing   Problem: Education: Goal: Knowledge of the prescribed therapeutic regimen will improve Outcome: Not Progressing   Problem: Role Relationship: Goal: Ability to communicate needs accurately will improve Outcome: Progressing Goal: Ability to interact with others will improve Outcome: Not Progressing   Problem: Health Behavior/Discharge Planning: Goal: Compliance with prescribed medication regimen will improve Outcome: Progressing   Problem: Education: Goal: Will be free of psychotic symptoms Outcome: Not Progressing   Problem: Coping: Goal: Ability to verbalize frustrations and anger appropriately will improve Outcome: Progressing Goal: Ability to demonstrate self-control will improve Outcome: Progressing   Problem: Safety: Goal: Periods of time without injury will increase Outcome: Progressing   Problem: Activity: Goal: Will verbalize the importance of balancing activity with adequate rest periods Outcome: Not Progressing

## 2019-10-01 NOTE — Progress Notes (Signed)
Care of patient taken over at 2300, patient requires some redirection to stay on task , she kept voicing that she was trying to get out of here. She does not understand why she is here. Agitated and guarded on approach. She is in her room rambling through her belongings and in the bathroom at this time. Safety checks ensured as ordered.

## 2019-10-01 NOTE — BHH Group Notes (Signed)
LCSW Group Therapy Note  10/01/2019 1:00 PM  Type of Therapy/Topic:  Group Therapy:  Balance in Life  Participation Level:  None  Description of Group:    This group will address the concept of balance and how it feels and looks when one is unbalanced. Patients will be encouraged to process areas in their lives that are out of balance and identify reasons for remaining unbalanced. Facilitators will guide patients in utilizing problem-solving interventions to address and correct the stressor making their life unbalanced. Understanding and applying boundaries will be explored and addressed for obtaining and maintaining a balanced life. Patients will be encouraged to explore ways to assertively make their unbalanced needs known to significant others in their lives, using other group members and facilitator for support and feedback.  Therapeutic Goals: 1. Patient will identify two or more emotions or situations they have that consume much of in their lives. 2. Patient will identify signs/triggers that life has become out of balance:  3. Patient will identify two ways to set boundaries in order to achieve balance in their lives:  4. Patient will demonstrate ability to communicate their needs through discussion and/or role plays  Summary of Patient Progress: Patient attended group, however was unable to participate in discussion. Patient arrived to group as it was ending.   Therapeutic Modalities:   Cognitive Behavioral Therapy Solution-Focused Therapy Assertiveness Training  Penni Homans MSW, [place] 10/01/2019 2:11 PM
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
## 2019-10-02 ENCOUNTER — Ambulatory Visit (HOSPITAL_COMMUNITY): Payer: Medicare HMO | Admitting: Psychiatry

## 2019-10-02 MED ORDER — QUETIAPINE FUMARATE 200 MG PO TABS
200.0000 mg | ORAL_TABLET | Freq: Every day | ORAL | Status: DC
  Administered 2019-10-02: 200 mg via ORAL
  Filled 2019-10-02: qty 1

## 2019-10-02 NOTE — Progress Notes (Signed)
Recreation Therapy Notes   Date: 10/02/2019  Time: 9:30 am  Location: Craft room  Behavioral response: Appropriate  Intervention Topic: Stress    Discussion/Intervention:  Group content on today was focused on stress. The group defined stress and way to cope with stress. Participants expressed how they know when they are stresses out. Individuals described the different ways they have to cope with stress. The group stated reasons why it is important to cope with stress. Patient explained what good stress is and some examples. The group participated in the intervention "Stress Management". Individuals were separated into two group and answered questions related to stress.  Clinical Observations/Feedback:  Patient came to group and was sleep. Patient woke up and stood in the corner behind peers. When ask by writer to move from behind peers and provide social distancing due to making others feel uncomfortable patient refused. Individual was walked out of group by nurse to her room. Heather Marquez LRT/CTRS         Kellen Hover 10/02/2019 12:07 PM

## 2019-10-02 NOTE — Plan of Care (Signed)
Patient hasn't had time to progress  Problem: Education: Goal: Knowledge of [place] [facility] Education information/materials will improve Outcome: Not Progressing   Problem: Coping: Goal: Ability to verbalize frustrations and anger appropriately will improve Outcome: Not Progressing Goal: Ability to demonstrate self-control will improve Outcome: Not Progressing   Problem: Safety: Goal: Periods of time without injury will increase Outcome: Not Progressing   Problem: Activity: Goal: Will verbalize the importance of balancing activity with adequate rest periods Outcome: Not Progressing

## 2019-10-02 NOTE — BHH Group Notes (Signed)
LCSW Group Therapy Note  10/02/2019 1:00 PM  Type of Therapy and Topic:  Group Therapy:  Feelings around Relapse and Recovery  Participation Level:  Minimal   Description of Group:    Patients in this group will discuss emotions they experience before and after a relapse. They will process how experiencing these feelings, or avoidance of experiencing them, relates to having a relapse. Facilitator will guide patients to explore emotions they have related to recovery. Patients will be encouraged to process which emotions are more powerful. They will be guided to discuss the emotional reaction significant others in their lives may have to their relapse or recovery. Patients will be assisted in exploring ways to respond to the emotions of others without this contributing to a relapse.  Therapeutic Goals: 1. Patient will identify two or more emotions that lead to a relapse for them 2. Patient will identify two emotions that result when they relapse 3. Patient will identify two emotions related to recovery 4. Patient will demonstrate ability to communicate their needs through discussion and/or role plays   Summary of Patient Progress:  Patient attended class. Patient participated minimally. Pt defined relapse as a "set back". Pt defined addiction as " being controlled by something has a strong hold on you".   Therapeutic Modalities:   Cognitive Behavioral Therapy Solution-Focused Therapy Assertiveness Training Relapse Prevention Therapy   Teresita Madura, MSW, LCSWA Clinical Social Work 10/02/2019 1:55 PM

## 2019-10-02 NOTE — Progress Notes (Signed)
Patient was in the day room upon arrival to the unit. Patient complained of stomach pain upon assessment. Patient given ginger ale and ice chips. Patient taken to her room and got into bed. Within 10 minutes of this patient was back out stating she was feeling fine. Patient compliant with medication administration per MD orders. Patient presents with pressured speech and disorganized thought process. Patient is redirectable. Patient being monitored Q 15 minutes for safety per unit protocol. Patient remains safe on the unit.

## 2019-10-02 NOTE — Progress Notes (Signed)
Recreation Therapy Notes  INPATIENT RECREATION THERAPY ASSESSMENT  Patient Details Name: Hellon Vaccarella MRN: [id number] DOB: [birth year] Today's Date: 10/02/2019       Information Obtained From: Chart Review(and some from patient)  Able to Participate in Assessment/Interview:    Patient Presentation:    Reason for Admission (Per Patient): Active Symptoms  Patient Stressors:    Coping Skills:   Music, Prayer  Leisure Interests (2+):  Music - Listen, Music - Singing(Cleaning)  Frequency of Recreation/Participation: Monthly  Awareness of Community Resources:  Yes  Community Resources:  [employer]  Current Use:    If no, Barriers?:    Expressed Interest in [employer] Information:    [place] of Residence:  [employer]  Patient Main Form of Transportation: Other (Comment)(Husband)  Patient Strengths:  Get along well with others  Patient Identified Areas of Improvement:  Getting help  Patient Goal for Hospitalization:  To get better  Current SI (including self-harm):     Current HI:     Current AVH:    Staff Intervention Plan: Group Attendance, Collaborate with Interdisciplinary Treatment Team  Consent to Intern Participation: N/A  Earlee Herald 10/02/2019, 2:43 PM

## 2019-10-02 NOTE — [facility] Suicide Risk Assessment (Signed)
[facility] INPATIENT:  Family/Significant Other Suicide Prevention Education  Suicide Prevention Education:  Education Completed; CSW spoke with [employer]. Mr. Giles has been identified by the patient as the family member/significant other with whom the patient will be residing, and identified as the person(s) who will aid the patient in the event of a mental health crisis (suicidal ideations/suicide attempt).  With written consent from the patient, the family member/significant other has been provided the following suicide prevention education, prior to the and/or following the discharge of the patient.  The suicide prevention education provided includes the following:  Suicide risk factors  Suicide prevention and interventions  National Suicide Hotline telephone number  [facility] assessment telephone number  [facility] Emergency Assistance 911  [facility] and/or Residential Mobile Crisis Unit telephone number  Request made of family/significant other to:  Remove weapons (e.g., guns, rifles, knives), all items previously/currently identified as safety concern.    Remove drugs/medications (over-the-counter, prescriptions, illicit drugs), all items previously/currently identified as a safety concern.  The family member/significant other verbalizes understanding of the suicide prevention education information provided.  The family member/significant other agrees to remove the items of safety concern listed above.   CSW spoke patient's husband. Husband stated the patient's behaviors began to change. Husband reports the retirement home community they currently reside in has filed complaints. He stated the community manager asked them to leave prematurely because the residents are afraid of the patient. The husband states that the manager was aware of the patient's diagnosis. The husband reports he has found an apartment to move into with the patient. The husband plans  to provide transportation for patient once she is discharged.   The husband reports no barriers to accessing medication, or after care services.  CSW spoke with the patient. The patient states she is connected to a psychiatrist with [facility],  Dr.Akhtar and she would like to follow up with him post discharge. Patient also reports that she would like one on one counseling services as well.    Jimmey Ralph, MSW, LCSWA 10/02/2019, 11:49 AM

## 2019-10-02 NOTE — Progress Notes (Signed)
D: Manic Behavior / Psychosis  A: Patient stated slept good last night .Stated appetite is good and energy level  Is normal. Stated concentration is good . Stated on Depression scale 0, hopeless 0 and anxiety 0 .( low 0-10 high) Denies suicidal  homicidal ideations  Observed auditory hallucinations  No pain concerns . Appropriate ADL'S. Interacting with peers and staff.  Encourage patient participation with unit programming . Instruction  Given on  Medication , verbalize understanding. Continue to be manic, cleaning the unit , unable to participate with unit programing   R: Voice no other concerns. Staff continue to monitor

## 2019-10-02 NOTE — Plan of Care (Signed)
Patient  able to manage  frustration  appropriately  no lost  of control this shift .  Voice of no safety concerns  Thought process remained  Altered . Compliant  with medication staff educate patient  on reason for  medication . Encouraging patient to work on coping , anxiety and decision making .   Problem: Education: Goal: Knowledge of [place] [facility] Education information/materials will improve Outcome: Not Progressing   Problem: Coping: Goal: Ability to verbalize frustrations and anger appropriately will improve Outcome: Progressing Goal: Ability to demonstrate self-control will improve Outcome: Progressing   Problem: Safety: Goal: Periods of time without injury will increase Outcome: Progressing   Problem: Education: Goal: Will be free of psychotic symptoms Outcome: Not Progressing   Problem: Health Behavior/Discharge Planning: Goal: Compliance with prescribed medication regimen will improve Outcome: Progressing   Problem: Role Relationship: Goal: Ability to communicate needs accurately will improve Outcome: Progressing Goal: Ability to interact with others will improve Outcome: Progressing   Problem: Education: Goal: Knowledge of the prescribed therapeutic regimen will improve Outcome: Progressing   Problem: Self-Concept: Goal: Level of anxiety will decrease Outcome: Progressing

## 2019-10-02 NOTE — Progress Notes (Signed)
[facility] MD Progress Note  10/02/2019 3:10 PM Heather Marquez  MRN:  [id number] Subjective: Follow-up for this 59 year old woman with bipolar disorder.  Patient continues to be hyperverbal and agitated disorganized with flight of ideas.  Hyper religious.  "Long stretches of scripture to me but none of them really seem to hold together in any coherent thought.  She paces around the unit holding multiple disparate items in her hand.  Later in the afternoon she came to the door and told me that she could no longer stay in her room because it was cursed or some such thing.  Evil spirits.  Has tolerated medicine.  No clear side effects from adding the Seroquel. Principal Problem: Severe manic bipolar 1 disorder with psychotic behavior ([place]) Diagnosis: Principal Problem:   Severe manic bipolar 1 disorder with psychotic behavior ([place]) Active Problems:   Essential hypertension   Bipolar 1 disorder (HCC)  Total Time spent with patient: 30 minutes  Past Psychiatric History: Patient has a long history of bipolar disorder which generally has been quite stable on Depakote but recently has decompensated  Past Medical History:  Past Medical History:  Diagnosis Date  . Bipolar 1 disorder (HCC)    x 21 years  . Hypertension    unknown   History reviewed. No pertinent surgical history. Family History: History reviewed. No pertinent family history. Family Psychiatric  History: See previous Social History:  Social History   Substance and Sexual Activity  Alcohol Use No     Social History   Substance and Sexual Activity  Drug Use No    Social History   Socioeconomic History  . Marital status: Married    Spouse name: Not on file  . Number of children: Not on file  . Years of education: Not on file  . Highest education level: Not on file  Occupational History  . Not on file  Tobacco Use  . Smoking status: Never Smoker  . Smokeless tobacco: Never Used  Substance and Sexual Activity  . Alcohol use:  No  . Drug use: No  . Sexual activity: Yes    Partners: Male  Other Topics Concern  . Not on file  Social History Narrative  . Not on file   Social Determinants of Health   Financial Resource Strain:   . Difficulty of Paying Living Expenses: Not on file  Food Insecurity:   . Worried About Charity fundraiser in the Last Year: Not on file  . Ran Out of Food in the Last Year: Not on file  Transportation Needs:   . Lack of Transportation (Medical): Not on file  . Lack of Transportation (Non-Medical): Not on file  Physical Activity:   . Days of Exercise per Week: Not on file  . Minutes of Exercise per Session: Not on file  Stress:   . Feeling of Stress : Not on file  Social Connections:   . Frequency of Communication with Friends and Family: Not on file  . Frequency of Social Gatherings with Friends and Family: Not on file  . Attends Religious Services: Not on file  . Active Member of Clubs or Organizations: Not on file  . Attends Archivist Meetings: Not on file  . Marital Status: Not on file   Additional Social History:                         Sleep: Fair  Appetite:  Fair  Current Medications: Current Facility-Administered  Medications  Medication Dose Route Frequency Provider Last Rate Last Admin  . acetaminophen (TYLENOL) tablet 650 mg  650 mg Oral Q6H PRN Cristofano, Worthy Rancher, MD      . alum & mag hydroxide-simeth (MAALOX/MYLANTA) 200-200-20 MG/5ML suspension 30 mL  30 mL Oral Q4H PRN Cristofano, Paul A, MD      . amLODipine (NORVASC) tablet 10 mg  10 mg Oral Daily Braycen Burandt, Jackquline Denmark, MD   10 mg at 10/02/19 0806  . divalproex (DEPAKOTE) DR tablet 1,000 mg  1,000 mg Oral QHS Sherlynn Tourville T, MD   1,000 mg at 10/01/19 2212  . magnesium hydroxide (MILK OF MAGNESIA) suspension 30 mL  30 mL Oral Daily PRN Cristofano, Paul A, MD      . QUEtiapine (SEROQUEL) tablet 200 mg  200 mg Oral QHS Kyrin Gratz T, MD        Lab Results:  Results for orders placed or  performed during the hospital encounter of 09/30/19 (from the past 48 hour(s))  TSH     Status: None   Collection Time: 10/01/19  7:10 AM  Result Value Ref Range   TSH 2.048 0.350 - 4.500 uIU/mL    Comment: Performed by a 3rd Generation assay with a functional sensitivity of <=0.01 uIU/mL. Performed at [facility], [address] [place], [place] [address]   Lipid panel     Status: None   Collection Time: 10/01/19  7:10 AM  Result Value Ref Range   Cholesterol 160 0 - 200 mg/dL   Triglycerides 46 <025 mg/dL   HDL 71 >85 mg/dL   Total CHOL/HDL Ratio 2.3 RATIO   VLDL 9 0 - 40 mg/dL   LDL Cholesterol 80 0 - 99 mg/dL    Comment:        Total Cholesterol/HDL:CHD Risk Coronary Heart Disease Risk Table                     Men   Women  1/2 Average Risk   3.4   3.3  Average Risk       5.0   4.4  2 X Average Risk   9.6   7.1  3 X Average Risk  23.4   11.0        Use the calculated Patient Ratio above and the CHD Risk Table to determine the patient's CHD Risk.        ATP III CLASSIFICATION (LDL):  <100     mg/dL   Optimal  277-824  mg/dL   Near or Above                    Optimal  130-159  mg/dL   Borderline  235-361  mg/dL   High  >443     mg/dL   Very High Performed at [facility], [address] [place], [place] [address]   Hemoglobin A1c     Status: None   Collection Time: 10/01/19  7:10 AM  Result Value Ref Range   Hgb A1c MFr Bld 5.6 4.8 - 5.6 %    Comment: (NOTE) Pre diabetes:          5.7%-6.4% Diabetes:              >6.4% Glycemic control for   <7.0% adults with diabetes    Mean Plasma Glucose 114.02 mg/dL    Comment: Performed at [facility], [address], [place], [place] [address]    Blood  Alcohol level:  Lab Results  Component Value Date   ETH <10 09/29/2019    Metabolic Disorder Labs: Lab Results  Component Value Date   HGBA1C 5.6 10/01/2019   MPG 114.02 10/01/2019   No results found for: PROLACTIN Lab Results   Component Value Date   CHOL 160 10/01/2019   TRIG 46 10/01/2019   HDL 71 10/01/2019   CHOLHDL 2.3 10/01/2019   VLDL 9 10/01/2019   LDLCALC 80 10/01/2019    Physical Findings: AIMS:  , ,  ,  ,    CIWA:    COWS:     Musculoskeletal: Strength & Muscle Tone: within normal limits Gait & Station: normal Patient leans: N/A  Psychiatric Specialty Exam: Physical Exam  Nursing note and vitals reviewed. Constitutional: She appears well-developed and well-nourished.  HENT:  Head: Normocephalic and atraumatic.  Eyes: Pupils are equal, round, and reactive to light. Conjunctivae are normal.  Cardiovascular: Regular rhythm and normal heart sounds.  Respiratory: Effort normal.  GI: Soft.  Musculoskeletal:        General: Normal range of motion.     Cervical back: Normal range of motion.  Neurological: She is alert.  Skin: Skin is warm and dry.  Psychiatric: Her affect is labile. Her speech is tangential. She is agitated. Thought content is paranoid and delusional. Cognition and memory are impaired. She expresses impulsivity.    Review of Systems  Constitutional: Negative.   HENT: Negative.   Eyes: Negative.   Respiratory: Negative.   Cardiovascular: Negative.   Gastrointestinal: Negative.   Musculoskeletal: Negative.   Skin: Negative.   Neurological: Negative.   Psychiatric/Behavioral: Positive for dysphoric mood. The patient is nervous/anxious and is hyperactive.     Blood pressure 114/68, pulse 73, temperature 98.1 F (36.7 C), temperature source Oral, resp. rate 18, height 5\' 4"  (1.626 m), weight 69.4 kg, SpO2 100 %.Body mass index is 26.26 kg/m.  General Appearance: Disheveled  Eye Contact:  Fair  Speech:  Garbled  Volume:  Increased  Mood:  Anxious  Affect:  Labile  Thought Process:  Disorganized  Orientation:  Full (Time, Place, and Person)  Thought Content:  Illogical, Delusions, Paranoid Ideation, Rumination and Tangential  Suicidal Thoughts:  No  Homicidal  Thoughts:  No  Memory:  Immediate;   Fair Recent;   Fair Remote;   Fair  Judgement:  Impaired  Insight:  Shallow  Psychomotor Activity:  Restlessness  Concentration:  Concentration: Poor  Recall:  of Knowledge:  Fair  Language:  Fair  Akathisia:  No  Handed:  Right  AIMS (if indicated):     Assets:  Desire for Improvement Housing Physical Health Resilience Social Support  ADL's:  Impaired  Cognition:  Impaired,  Mild  Sleep:  Number of Hours: 4.25     Treatment Plan Summary: Daily contact with patient to assess and evaluate symptoms and progress in treatment, Medication management and Plan Patient with bipolar disorder having a manic episode.  Slightly better in her behavior than she was yesterday but still disorganized in her thinking labile and having psychotic symptoms.  Tried my best to reassure her.  Increase Seroquel to 200 mg at night.  Will recommend over the weekend continued increases.  [employer], MD 10/02/2019, 3:10 PM

## 2019-10-03 MED ORDER — VITAMIN B-12 1000 MCG PO TABS
1000.0000 ug | ORAL_TABLET | Freq: Every day | ORAL | Status: DC
  Administered 2019-10-04 – 2019-10-06 (×3): 1000 ug via ORAL
  Filled 2019-10-03 (×3): qty 1

## 2019-10-03 MED ORDER — QUETIAPINE FUMARATE 200 MG PO TABS
300.0000 mg | ORAL_TABLET | Freq: Every day | ORAL | Status: DC
  Filled 2019-10-03 (×2): qty 2

## 2019-10-03 MED ORDER — BACITRACIN-NEOMYCIN-POLYMYXIN 400-5-5000 EX OINT
TOPICAL_OINTMENT | CUTANEOUS | Status: DC | PRN
  Filled 2019-10-03 (×4): qty 1

## 2019-10-03 MED ORDER — TRAMADOL HCL 50 MG PO TABS: 50.0000 mg | ORAL_TABLET | Freq: Four times a day (QID) | ORAL | Status: DC | PRN

## 2019-10-03 NOTE — Progress Notes (Signed)
[facility] MD Progress Note  10/03/2019 10:29 AM Heather Marquez  MRN:  [id number] Subjective: Patient is a 59 year old female with a past psychiatric history significant for bipolar disorder who is brought to the [facility] on 09/30/2019 with symptoms of mania.  She had just been recently discharged from [facility] prior to this hospitalization.  Objective: Patient is seen and examined.  Patient is a 59 year old female with the above-stated past psychiatric history who is seen in follow-up.  She is still manic, delusional and disorganized.  She is at times hyper religious as well.  She is focused on the bulletin board today.  We reviewed some of her previous medications, and she stated "Haldol made me to the point that I could not walk".  Her laboratories from 1/19 revealed a mildly elevated creatinine at 1.14, mildly elevated blood sugar at 154.  She has a mild anemia with a hemoglobin of 11 and hematocrit of 33.9.  Her Depakote level on 1/20 was 118.  Her hemoglobin A1c was 5.6.  TSH was normal at 2.048.  Drug screen was negative.  Her EKG showed a bifascicular block, and a normal QTC interval.  Her vital signs are stable, she is afebrile.  She only slept 4.25 hours last night.  She continues on Depakote 2000 mg p.o. nightly and Seroquel 200 mg p.o. nightly.  She is on amlodipine 10 mg p.o. daily for hypertension.  She denied any suicidal or homicidal ideation.  She denied any auditory or visual hallucinations.  She is disorganized, pressured and delusional as stated above.  Her emergency room visit from 09/18/2019 showed that she has been taking cyanocobalamin orally, the Depakote DR 500 mg nightly, labetalol, losartan, but no mood stabilizing antipsychotics at that time.  This was prior to her going to old [facility].  Principal Problem: Severe manic bipolar 1 disorder with psychotic behavior ([place]) Diagnosis: Principal Problem:   Severe manic bipolar 1 disorder with psychotic behavior  ([place]) Active Problems:   Essential hypertension   Bipolar 1 disorder ([place])  Total Time spent with patient: 20 minutes  Past Psychiatric History: See admission H&P  Past Medical History:  Past Medical History:  Diagnosis Date  . Bipolar 1 disorder (HCC)    x 21 years  . Hypertension    unknown   History reviewed. No pertinent surgical history. Family History: History reviewed. No pertinent family history. Family Psychiatric  History: See admission H&P Social History:  Social History   Substance and Sexual Activity  Alcohol Use No     Social History   Substance and Sexual Activity  Drug Use No    Social History   Socioeconomic History  . Marital status: Married    Spouse name: Not on file  . Number of children: Not on file  . Years of education: Not on file  . Highest education level: Not on file  Occupational History  . Not on file  Tobacco Use  . Smoking status: Never Smoker  . Smokeless tobacco: Never Used  Substance and Sexual Activity  . Alcohol use: No  . Drug use: No  . Sexual activity: Yes    Partners: Male  Other Topics Concern  . Not on file  Social History Narrative  . Not on file   Social Determinants of Health   Financial Resource Strain:   . Difficulty of Paying Living Expenses: Not on file  Food Insecurity:   . Worried About Charity fundraiser in the Last Year: Not on  file  . Ran Out of Food in the Last Year: Not on file  Transportation Needs:   . Lack of Transportation (Medical): Not on file  . Lack of Transportation (Non-Medical): Not on file  Physical Activity:   . Days of Exercise per Week: Not on file  . Minutes of Exercise per Session: Not on file  Stress:   . Feeling of Stress : Not on file  Social Connections:   . Frequency of Communication with Friends and Family: Not on file  . Frequency of Social Gatherings with Friends and Family: Not on file  . Attends Religious Services: Not on file  . Active Member of Clubs or  Organizations: Not on file  . Attends Banker Meetings: Not on file  . Marital Status: Not on file   Additional Social History:                         Sleep: Poor  Appetite:  Fair  Current Medications: Current Facility-Administered Medications  Medication Dose Route Frequency Provider Last Rate Last Admin  . acetaminophen (TYLENOL) tablet 650 mg  650 mg Oral Q6H PRN Cristofano, Worthy Rancher, MD      . alum & mag hydroxide-simeth (MAALOX/MYLANTA) 200-200-20 MG/5ML suspension 30 mL  30 mL Oral Q4H PRN Cristofano, Paul A, MD      . amLODipine (NORVASC) tablet 10 mg  10 mg Oral Daily Clapacs, Jackquline Denmark, MD   10 mg at 10/03/19 0753  . divalproex (DEPAKOTE) DR tablet 1,000 mg  1,000 mg Oral QHS Clapacs, Jackquline Denmark, MD   1,000 mg at 10/02/19 2205  . magnesium hydroxide (MILK OF MAGNESIA) suspension 30 mL  30 mL Oral Daily PRN Cristofano, Paul A, MD      . QUEtiapine (SEROQUEL) tablet 200 mg  200 mg Oral QHS Clapacs, John T, MD   200 mg at 10/02/19 2206    Lab Results: No results found for this or any previous visit (from the past 48 hour(s)).  Blood Alcohol level:  Lab Results  Component Value Date   ETH <10 09/29/2019    Metabolic Disorder Labs: Lab Results  Component Value Date   HGBA1C 5.6 10/01/2019   MPG 114.02 10/01/2019   No results found for: PROLACTIN Lab Results  Component Value Date   CHOL 160 10/01/2019   TRIG 46 10/01/2019   HDL 71 10/01/2019   CHOLHDL 2.3 10/01/2019   VLDL 9 10/01/2019   LDLCALC 80 10/01/2019    Physical Findings: AIMS:  , ,  ,  ,    CIWA:    COWS:     Musculoskeletal: Strength & Muscle Tone: within normal limits Gait & Station: normal Patient leans: N/A  Psychiatric Specialty Exam: Physical Exam  Nursing note and vitals reviewed. Constitutional: She is oriented to person, place, and time. She appears well-developed and well-nourished.  HENT:  Head: Normocephalic and atraumatic.  Respiratory: Effort normal.   Neurological: She is alert and oriented to person, place, and time.    Review of Systems  Blood pressure 114/68, pulse 73, temperature 98.1 F (36.7 C), temperature source Oral, resp. rate 18, height 5\' 4"  (1.626 m), weight 69.4 kg, SpO2 100 %.Body mass index is 26.26 kg/m.  General Appearance: Disheveled  Eye Contact:  Fair  Speech:  Pressured  Volume:  Normal  Mood:  Dysphoric  Affect:  Congruent  Thought Process:  Disorganized and Descriptions of Associations: Tangential  Orientation:  Full (Time, Place,  and Person)  Thought Content:  Delusions, Paranoid Ideation, Rumination and Tangential  Suicidal Thoughts:  No  Homicidal Thoughts:  No  Memory:  Immediate;   Fair Recent;   Fair Remote;   Fair  Judgement:  Impaired  Insight:  Fair  Psychomotor Activity:  Increased  Concentration:  Concentration: Poor and Attention Span: Poor  Recall:  Poor  Fund of Knowledge:  Fair  Language:  Good  Akathisia:  Negative  Handed:  Right  AIMS (if indicated):     Assets:  Desire for Improvement Resilience  ADL's:  Intact  Cognition:  WNL  Sleep:  Number of Hours: 4     Treatment Plan Summary: Daily contact with patient to assess and evaluate symptoms and progress in treatment, Medication management and Plan : Patient is seen and examined.  Patient is a 59 year old female with the above-stated past psychiatric history who is seen in follow-up.   Diagnosis: #1 bipolar disorder, most recently mixed, severe with psychotic features, #2 hypertension, #3 osteoarthritis, #4 hyperlipidemia, #5 reported history of type 2 diabetes, #6 bifascicular block  Patient is seen in follow-up.  She remains delusional, disorganized, tangential.  She is not sleeping.  She is already on Seroquel, and I will increase that dosage to 300 mg p.o. nightly.  We will see how she responds to that.  Her Depakote level appears to be in an adequate range, and she is not having any side effects to it.  I will repeat her  EKG tomorrow some time to make sure that the Seroquel is not impacting her abnormal EKG but normal QTc interval.  Surprisingly she has been treated with more significant antihypertensive agents in the past, but her pressure seems fairly well controlled at this point.  We will monitor her rate and pressure and adjust if necessary.  I will continue the Tylenol for arthritic pain.  1.  Continue amlodipine 10 mg p.o. daily for hypertension. 2.  Continue Depakote DR 1000 mg p.o. nightly for mood stability. 3.  Increase Seroquel to 300 mg p.o. nightly for mood stability and psychosis. 4.  Repeat EKG on 10/04/2019. 5.  Disposition planning-in progress.  Antonieta Pert, MD 10/03/2019, 10:29 AM

## 2019-10-03 NOTE — [facility] Group Notes (Signed)
[facility] LCSW Group Therapy Note   Date and Time: 10/03/2019  Type of Group and Topic: Psychoeducational Group: Discharge Planning and Individual Wellness  Participation Level: [facility] PARTICIPATION LEVEL: Did Not Attend    Description of Group: Discharge planning group reviews patient's anticipated discharge plans and assists patients to anticipate and address any barriers to wellness/recovery in the community. Suicide prevention education is reviewed with patients in group. Therapeutic Goals 1. Patients will state their anticipated discharge plan and mental health aftercare 2. Patients will identify potential barriers to wellness in the community setting 3. Patients will engage in problem solving, solution focused discussion of ways to anticipate and address barriers to wellness/recovery     Summary of Patient Progress:   X    Plan for Discharge/Comments:    Transportation Means:   Supports:     Therapeutic Modalities: Motivational Interviewing and Psycho-Education  Teresita Madura, MSW, [employer] Clinical Social Worker

## 2019-10-03 NOTE — Plan of Care (Signed)
Patient observed by this Clinical research associate having a conversation with herself in her room. Patient responding to internal stimuli.   Problem: Health Behavior/Discharge Planning: Goal: Compliance with prescribed medication regimen will improve Outcome: Not Progressing

## 2019-10-03 NOTE — Plan of Care (Signed)
  Problem: Coping: Goal: Ability to verbalize frustrations and anger appropriately will improve Outcome: Progressing   Problem: Safety: Goal: Periods of time without injury will increase Outcome: Progressing   Problem: Education: Goal: Knowledge of [place] [facility] Education information/materials will improve Outcome: Not Progressing   Problem: Coping: Goal: Ability to demonstrate self-control will improve Outcome: Not Progressing   Problem: Activity: Goal: Will verbalize the importance of balancing activity with adequate rest periods Outcome: Not Progressing   Problem: Education: Goal: Will be free of psychotic symptoms Outcome: Not Progressing

## 2019-10-03 NOTE — Progress Notes (Signed)
Patient was in her room upon arrival to the unit. Patient compliant with medication administration per MD orders. Patient presents with pressured speech and disorganized thought process. Patient was adamant about cleaning up the day room this evening but was redirectable. Patient being monitored Q 15 minutes for safety per unit protocol. Patient remains safe on the unit.

## 2019-10-03 NOTE — Progress Notes (Signed)
Patient presents with bright affect. Patient hyper religious and manic. Intrusive at times but redirectable. Denies SI, HI, AVH. Reports she is being obedient to her husband and staff and doing what we ask her to do. Patient does continue to be suspicious of medications but will take them with encouragement. Did attend group and keeps busy with coloring and reading a book in day room. Encouragement and support offered. Safety checks maintained. Medication given as prescribed. Pt receptive and remains safe on unit with q15 checks.

## 2019-10-04 DIAGNOSIS — Z5181 Encounter for therapeutic drug level monitoring: Secondary | ICD-10-CM

## 2019-10-04 MED ORDER — WHITE PETROLATUM EX OINT
TOPICAL_OINTMENT | CUTANEOUS | Status: AC
  Filled 2019-10-04: qty 5

## 2019-10-04 NOTE — [facility] Group Notes (Signed)
LCSW Group Therapy Note  10/04/2019 1:15pm  Type of Therapy and Topic:  Group Therapy:  Cognitive Distortions  Participation Level:  Active   Description of Group:    Patients in this group will be introduced to the topic of cognitive distortions.  Patients will identify and describe cognitive distortions, describe the feelings these distortions create for them.  Patients will identify one or more situations in their personal life where they have cognitively distorted thinking and will verbalize challenging this cognitive distortion through positive thinking skills.  Patients will practice the skill of using positive affirmations to challenge cognitive distortions using affirmation cards.    Therapeutic Goals:  1. Patient will identify two or more cognitive distortions they have used 2. Patient will identify one or more emotions that stem from use of a cognitive distortion 3. Patient will demonstrate use of a positive affirmation to counter a cognitive distortion through discussion and/or role play. 4. Patient will describe one way cognitive distortions can be detrimental to wellness   Summary of Patient Progress:The patient scored her mood at a 10 (10 best.) Patients were introduced to the topic of cognitive distortions.  The patient indicated that she was unable to think about an unhealthy thinking style because she is a "positive person and she has been very successful."      Therapeutic Modalities:   Cognitive Behavioral Therapy Motivational Interviewing   Shaili Donalson  CUEBAS-COLON, LCSW 10/04/2019 1:06 PM

## 2019-10-04 NOTE — Progress Notes (Signed)
Patient continues being manic and hyper religious, intrusive with several requests. Difficult at times re-directing. Denies any SI, HI, AVH. Requests numbers so that she can complain. Patient continues to obsess about hands and cracks in them. Staff applied neosporin and lotion to hands this am. Patient did take morning medications. Encouragement and support provided, medications given as prescribed. Re-directed vet as needed. Pt remains safe on unit with q 15 min checks.

## 2019-10-04 NOTE — Progress Notes (Signed)
D: Patient refused her hs seroquel because she thinks it is "breaking her hands out". Patient showed me her hands and she does have some cracked skin with some open areas. Order obtained for neosporin, which was given to patient. Patient was on the phone saying she was going to call 911 if she did not get something done about her hands. During the night, patient was asking for paper towels and cleaning supplies. She was cleaning the day room and down on her hands and knees on the floor in front of the phones, pouring a soap and water mixture she had made onto the floor and wiping it up with paper towels. Patient's husband called and said he had received a call from patient's brother expressing concern about the patient's hands. Husband said she had the open cracked skin before she came to the hospital from all the cleaning she was doing at home. Denies SI, HI and AVH. Patient convinced there is something on her head but there is nothing there. Perseverates about her hands, even though she has been provided with lotion, neosporin and numerous bandaids. A: Continue to monitor for safety R: Safety maintained.

## 2019-10-04 NOTE — Progress Notes (Signed)
[facility] MD Progress Note  10/04/2019 10:23 AM Heather Marquez  MRN:  [id number] Subjective:  Patient is a 59 year old female with a past psychiatric history significant for bipolar disorder who is brought to the [facility] on 09/30/2019 with symptoms of mania.  She had just been recently discharged from [facility] prior to this hospitalization.  Objective: Patient is seen and examined.  Patient is a 59 year old female with the above-stated past psychiatric history who is seen in follow-up.  Unfortunately she refused the Seroquel last night.  I had increased her dose to 300 mg to improve her mood and sleep, and we discussed that today.  I discussed the fact that if she took the medication it would most likely assist in her being discharged more rapidly.  She stated that she would take that.  She did state that the "pill" looked different for the Depakote, and was concerned about even taking that.  She did take it.  Nursing notes reflect that her brother or some other relative stated that she was back to her baseline.  She remains somewhat disorganized.  Perhaps slightly better than yesterday.  Her vital signs are stable, she is afebrile.  She again has poor sleep, and only slept 4 hours last night.  She complained of pain yesterday, and I wrote for tramadol given the increase in her creatinine and my concern that a nonsteroidal might lead to more problems there.  She denied any auditory or visual hallucinations.  She denied any suicidal or homicidal ideation.  Principal Problem: Severe manic bipolar 1 disorder with psychotic behavior (HCC) Diagnosis: Principal Problem:   Severe manic bipolar 1 disorder with psychotic behavior (HCC) Active Problems:   Essential hypertension   Bipolar 1 disorder (HCC)  Total Time spent with patient: 20 minutes  Past Psychiatric History: See admission H&P  Past Medical History:  Past Medical History:  Diagnosis Date  . Bipolar 1 disorder (HCC)    x  21 years  . Hypertension    unknown   History reviewed. No pertinent surgical history. Family History: History reviewed. No pertinent family history. Family Psychiatric  History: See admission H&P Social History:  Social History   Substance and Sexual Activity  Alcohol Use No     Social History   Substance and Sexual Activity  Drug Use No    Social History   Socioeconomic History  . Marital status: Married    Spouse name: Not on file  . Number of children: Not on file  . Years of education: Not on file  . Highest education level: Not on file  Occupational History  . Not on file  Tobacco Use  . Smoking status: Never Smoker  . Smokeless tobacco: Never Used  Substance and Sexual Activity  . Alcohol use: No  . Drug use: No  . Sexual activity: Yes    Partners: Male  Other Topics Concern  . Not on file  Social History Narrative  . Not on file   Social Determinants of Health   Financial Resource Strain:   . Difficulty of Paying Living Expenses: Not on file  Food Insecurity:   . Worried About Programme researcher, broadcasting/film/video in the Last Year: Not on file  . Ran Out of Food in the Last Year: Not on file  Transportation Needs:   . Lack of Transportation (Medical): Not on file  . Lack of Transportation (Non-Medical): Not on file  Physical Activity:   . Days of Exercise per Week: Not  on file  . Minutes of Exercise per Session: Not on file  Stress:   . Feeling of Stress : Not on file  Social Connections:   . Frequency of Communication with Friends and Family: Not on file  . Frequency of Social Gatherings with Friends and Family: Not on file  . Attends Religious Services: Not on file  . Active Member of Clubs or Organizations: Not on file  . Attends Banker Meetings: Not on file  . Marital Status: Not on file   Additional Social History:                         Sleep: Poor  Appetite:  Fair  Current Medications: Current Facility-Administered  Medications  Medication Dose Route Frequency Provider Last Rate Last Admin  . acetaminophen (TYLENOL) tablet 650 mg  650 mg Oral Q6H PRN Cristofano, Worthy Rancher, MD      . alum & mag hydroxide-simeth (MAALOX/MYLANTA) 200-200-20 MG/5ML suspension 30 mL  30 mL Oral Q4H PRN Cristofano, Paul A, MD      . amLODipine (NORVASC) tablet 10 mg  10 mg Oral Daily Clapacs, Jackquline Denmark, MD   10 mg at 10/04/19 0753  . divalproex (DEPAKOTE) DR tablet 1,000 mg  1,000 mg Oral QHS Clapacs, John T, MD   1,000 mg at 10/03/19 2126  . magnesium hydroxide (MILK OF MAGNESIA) suspension 30 mL  30 mL Oral Daily PRN Cristofano, Paul A, MD      . neomycin-bacitracin-polymyxin (NEOSPORIN) ointment packet   Topical PRN Dixon, Elray Buba, NP      . QUEtiapine (SEROQUEL) tablet 300 mg  300 mg Oral QHS Antonieta Pert, MD      . traMADol Janean Sark) tablet 50 mg  50 mg Oral Q6H PRN Antonieta Pert, MD      . vitamin B-12 (CYANOCOBALAMIN) tablet 1,000 mcg  1,000 mcg Oral Daily Antonieta Pert, MD   1,000 mcg at 10/04/19 0347    Lab Results: No results found for this or any previous visit (from the past 48 hour(s)).  Blood Alcohol level:  Lab Results  Component Value Date   ETH <10 09/29/2019    Metabolic Disorder Labs: Lab Results  Component Value Date   HGBA1C 5.6 10/01/2019   MPG 114.02 10/01/2019   No results found for: PROLACTIN Lab Results  Component Value Date   CHOL 160 10/01/2019   TRIG 46 10/01/2019   HDL 71 10/01/2019   CHOLHDL 2.3 10/01/2019   VLDL 9 10/01/2019   LDLCALC 80 10/01/2019    Physical Findings: AIMS:  , ,  ,  ,    CIWA:    COWS:     Musculoskeletal: Strength & Muscle Tone: within normal limits Gait & Station: normal Patient leans: N/A  Psychiatric Specialty Exam: Physical Exam  Nursing note and vitals reviewed. Constitutional: She is oriented to person, place, and time. She appears well-developed and well-nourished.  HENT:  Head: Normocephalic and atraumatic.  Respiratory: Effort  normal.  Neurological: She is alert and oriented to person, place, and time.    Review of Systems  Blood pressure 114/68, pulse 73, temperature 98.1 F (36.7 C), temperature source Oral, resp. rate 18, height 5\' 4"  (1.626 m), weight 69.4 kg, SpO2 100 %.Body mass index is 26.26 kg/m.  General Appearance: Casual  Eye Contact:  Fair  Speech:  Normal Rate  Volume:  Increased  Mood:  Anxious and Irritable  Affect:  Labile  Thought Process:  Coherent and Descriptions of Associations: Loose  Orientation:  Full (Time, Place, and Person)  Thought Content:  Delusions, Paranoid Ideation and Rumination  Suicidal Thoughts:  No  Homicidal Thoughts:  No  Memory:  Immediate;   Fair Recent;   Fair Remote;   Fair  Judgement:  Impaired  Insight:  Lacking  Psychomotor Activity:  Increased  Concentration:  Concentration: Fair and Attention Span: Fair  Recall:  [employer] of Knowledge:  Fair  Language:  Good  Akathisia:  Negative  Handed:  Right  AIMS (if indicated):     Assets:  Desire for Improvement Resilience  ADL's:  Intact  Cognition:  WNL  Sleep:  Number of Hours: 6.5     Treatment Plan Summary: Daily contact with patient to assess and evaluate symptoms and progress in treatment, Medication management and Plan : Patient is seen and examined.  Patient is a 59 year old female with the above-stated past psychiatric history who is seen in follow-up.   Diagnosis: #1 bipolar disorder, most recently mixed, severe with psychotic features, #2 hypertension, #3 osteoarthritis, #4 hyperlipidemia, #5 reported history of type 2 diabetes, #6 bifascicular block  Patient is seen in follow-up.  She remains delusional, disorganized and tangential.  Her sleep remains poor.  I had increased her Seroquel to 300 mg p.o. nightly.  I explained to her that it would improve her sleep if she took the dose.  She has at least agreed to that this morning.  We will see what happens tonight.  Otherwise her vital signs  are stable, she is afebrile.  I have ordered an EKG for today, especially given her bifascicular block, but I would hope that she would have the increased dose of Seroquel in her system by this a.m.  Hopefully we can have that done a little bit later today.  1.  Continue amlodipine 10 mg p.o. daily for hypertension. 2.  Continue Depakote DR 1000 mg p.o. nightly for mood stability. 3.  Increase Seroquel to 300 mg p.o. nightly for mood stability and psychosis. 4.    Continue tramadol 50 mg p.o. every 6 hours as needed pain. 5.  Repeat EKG on 10/04/2019. 6.  Disposition planning-in progress.  Sharma Covert, MD 10/04/2019, 10:23 AM

## 2019-10-04 NOTE — Plan of Care (Signed)
  Problem: Coping: Goal: Ability to verbalize frustrations and anger appropriately will improve Outcome: Progressing Goal: Ability to demonstrate self-control will improve Outcome: Progressing   Problem: Safety: Goal: Periods of time without injury will increase Outcome: Progressing   Problem: Education: Goal: Knowledge of [place] [facility] Education information/materials will improve Outcome: Not Progressing   Problem: Activity: Goal: Will verbalize the importance of balancing activity with adequate rest periods Outcome: Not Progressing

## 2019-10-04 NOTE — Plan of Care (Signed)
  Problem: Education: Goal: Knowledge of [place] [facility] Education information/materials will improve Outcome: Not Progressing  D: Patient refused her hs seroquel because she thinks it is "breaking her hands out". Patient showed me her hands and she does have some cracked skin with some open areas. Order obtained for neosporin, which was given to patient. Patient was on the phone saying she was going to call 911 if she did not get something done about her hands. During the night, patient was asking for paper towels and cleaning supplies. She was cleaning the day room and down on her hands and knees on the floor in front of the phones, pouring a soap and water mixture she had made onto the floor and wiping it up with paper towels. Patient's husband called and said he had received a call from patient's brother expressing concern about the patient's hands. Husband said she had the open cracked skin before she came to the hospital from all the cleaning she was doing at home. Denies SI, HI and AVH. Patient convinced there is something on her head but there is nothing there. Perseverates about her hands, even though she has been provided with lotion, neosporin and numerous bandaids. A: Continue to monitor for safety R: Safety maintained.

## 2019-10-05 NOTE — Progress Notes (Signed)
[facility] MD Progress Note  10/05/2019 1:40 PM Heather Marquez  MRN:  [id number]   Subjective: Follow-up for this 59 year old female diagnosed with severe manic bipolar 1 disorder with psychotic behavior.  Patient reports that she is doing fine today.  She reports that things have been going well for her and that she has been taking her medications.  Patient states that she has no complaints today.  She again states "I am not technically a patient.  I came in here voluntarily because Jimmie told me I needed to."  Patient was referring to her husband and continue stating that if he felt that she needed to be in the hospital then she would stay.  Patient denies having any suicidal or homicidal ideations and denies having any hallucinations today.  Principal Problem: Severe manic bipolar 1 disorder with psychotic behavior ([place]) Diagnosis: Principal Problem:   Severe manic bipolar 1 disorder with psychotic behavior ([place]) Active Problems:   Essential hypertension   Bipolar 1 disorder ([place])  Total Time spent with patient: 20 minutes  Past Psychiatric History: Patient has been living in [place] since 2018.  During that time she has been seeing an outpatient psychiatrist and has been maintained on Depakote as her only psychiatric medicine.  During that whole time it looks like she was stable and never had any hospitalizations until recently.  Husband reports that before that when they lived in [place] she had some other hospitalizations for similar symptoms and has also had depressive episodes in the past.  He knows that she has been on medicines in the past but he cannot remember which ones.  He also unfortunately cannot remember an accurate name for the provider she was seeing.  Denies any history of suicide attempts or violence  Past Medical History:  Past Medical History:  Diagnosis Date  . Bipolar 1 disorder (HCC)    x 21 years  . Hypertension    unknown   History reviewed. No  pertinent surgical history. Family History: History reviewed. No pertinent family history. Family Psychiatric  History: None reported Social History:  Social History   Substance and Sexual Activity  Alcohol Use No     Social History   Substance and Sexual Activity  Drug Use No    Social History   Socioeconomic History  . Marital status: Married    Spouse name: Not on file  . Number of children: Not on file  . Years of education: Not on file  . Highest education level: Not on file  Occupational History  . Not on file  Tobacco Use  . Smoking status: Never Smoker  . Smokeless tobacco: Never Used  Substance and Sexual Activity  . Alcohol use: No  . Drug use: No  . Sexual activity: Yes    Partners: Male  Other Topics Concern  . Not on file  Social History Narrative  . Not on file   Social Determinants of Health   Financial Resource Strain:   . Difficulty of Paying Living Expenses: Not on file  Food Insecurity:   . Worried About Charity fundraiser in the Last Year: Not on file  . Ran Out of Food in the Last Year: Not on file  Transportation Needs:   . Lack of Transportation (Medical): Not on file  . Lack of Transportation (Non-Medical): Not on file  Physical Activity:   . Days of Exercise per Week: Not on file  . Minutes of Exercise per Session: Not on file  Stress:   . Feeling of Stress : Not on file  Social Connections:   . Frequency of Communication with Friends and Family: Not on file  . Frequency of Social Gatherings with Friends and Family: Not on file  . Attends Religious Services: Not on file  . Active Member of Clubs or Organizations: Not on file  . Attends Banker Meetings: Not on file  . Marital Status: Not on file   Additional Social History:                         Sleep: Good  Appetite:  Good  Current Medications: Current Facility-Administered Medications  Medication Dose Route Frequency Provider Last Rate Last Admin   . acetaminophen (TYLENOL) tablet 650 mg  650 mg Oral Q6H PRN Cristofano, Worthy Rancher, MD      . alum & mag hydroxide-simeth (MAALOX/MYLANTA) 200-200-20 MG/5ML suspension 30 mL  30 mL Oral Q4H PRN Cristofano, Paul A, MD      . amLODipine (NORVASC) tablet 10 mg  10 mg Oral Daily Clapacs, Jackquline Denmark, MD   10 mg at 10/05/19 0813  . divalproex (DEPAKOTE) DR tablet 1,000 mg  1,000 mg Oral QHS Clapacs, John T, MD   1,000 mg at 10/04/19 2136  . magnesium hydroxide (MILK OF MAGNESIA) suspension 30 mL  30 mL Oral Daily PRN Cristofano, Paul A, MD      . neomycin-bacitracin-polymyxin (NEOSPORIN) ointment packet   Topical PRN Dixon, Elray Buba, NP      . QUEtiapine (SEROQUEL) tablet 300 mg  300 mg Oral QHS Antonieta Pert, MD      . traMADol Janean Sark) tablet 50 mg  50 mg Oral Q6H PRN Antonieta Pert, MD      . vitamin B-12 (CYANOCOBALAMIN) tablet 1,000 mcg  1,000 mcg Oral Daily Antonieta Pert, MD   1,000 mcg at 10/05/19 0813    Lab Results: No results found for this or any previous visit (from the past 48 hour(s)).  Blood Alcohol level:  Lab Results  Component Value Date   ETH <10 09/29/2019    Metabolic Disorder Labs: Lab Results  Component Value Date   HGBA1C 5.6 10/01/2019   MPG 114.02 10/01/2019   No results found for: PROLACTIN Lab Results  Component Value Date   CHOL 160 10/01/2019   TRIG 46 10/01/2019   HDL 71 10/01/2019   CHOLHDL 2.3 10/01/2019   VLDL 9 10/01/2019   LDLCALC 80 10/01/2019    Physical Findings: AIMS:  , ,  ,  ,    CIWA:    COWS:     Musculoskeletal: Strength & Muscle Tone: within normal limits Gait & Station: normal Patient leans: N/A  Psychiatric Specialty Exam: Physical Exam  Nursing note and vitals reviewed. Constitutional: She is oriented to person, place, and time. She appears well-developed and well-nourished.  Cardiovascular: Normal rate.  Respiratory: Effort normal.  Musculoskeletal:        General: Normal range of motion.  Neurological: She is  alert and oriented to person, place, and time.  Skin: Skin is warm.    Review of Systems  Constitutional: Negative.   HENT: Negative.   Eyes: Negative.   Respiratory: Negative.   Cardiovascular: Negative.   Gastrointestinal: Negative.   Genitourinary: Negative.   Musculoskeletal: Negative.   Skin: Negative.   Neurological: Negative.     Blood pressure (!) 141/81, pulse 83, temperature 98.3 F (36.8 C), temperature source Oral, resp. rate  18, height 5\' 4"  (1.626 m), weight 69.4 kg, SpO2 100 %.Body mass index is 26.26 kg/m.  General Appearance: Disheveled  Eye Contact:  Fair  Speech:  Clear and Coherent  Volume:  Normal  Mood:  Anxious  Affect:  Constricted  Thought Process:  Linear and Descriptions of Associations: Intact  Orientation:  Full (Time, Place, and Person)  Thought Content:  Rumination and Tangential  Suicidal Thoughts:  No  Homicidal Thoughts:  No  Memory:  Immediate;   Fair Recent;   Fair Remote;   Fair  Judgement:  Impaired  Insight:  Lacking  Psychomotor Activity:  Normal  Concentration:  Concentration: Fair  Recall:  of Knowledge:  Fair  Language:  Fair  Akathisia:  No  Handed:  Right  AIMS (if indicated):     Assets:  Desire for Improvement Financial Resources/Insurance Housing Resilience Social Support Transportation  ADL's:  Intact  Cognition:  WNL  Sleep:  Number of Hours: 2.5   Assessment: Patient presented attending group and is pleasant, calm, cooperative.  Patient's does not feel that she needs to be in the hospital but due to her husband states she needs to be here and she is okay with that.  Patient did not take her Seroquel again last night and when the patient was questioned about this she stated that the nurse did not provide her with the pill to take.  However it is documented in the chart that the patient refused the medication.  But highly encouraged nursing staff as well as patient to take the Seroquel tonight to assist  the improvement of patient's behavior and mood.  EKG was reviewed and QTC is 437.  Patient has been seen attending groups and interacting with peers and staff appropriately and has not had any behavioral issues while on the unit.  Patient does not appear to be responding to any internal or external stimuli.  Would like to see the patient taken her Seroquel for a couple of days prior. To discharge.   Treatment Plan Summary: Daily contact with patient to assess and evaluate symptoms and progress in treatment and Medication management Continue Norvasc 10 mg p.o. daily for hypertension Continue Depakote DR 1000 mg p.o. nightly for mood stability Continue Seroquel 300 mg p.o. nightly for bipolar 1 disorder Continue tramadol 50 mg p.o. every 6 hours as needed for moderate pain Encourage group therapy participation Encourage medication compliance Continue every 15 minute safety checks  [employer], FNP 10/05/2019, 1:40 PM

## 2019-10-05 NOTE — Plan of Care (Signed)
  Problem: Education: Goal: Knowledge of  General Education information/materials will improve Outcome: Not Progressing  D: Patient has been more isolative to room. Calmer. Refused seroquel. Denies SI HI and AVH A: Continue to monitor for safety R: Safety maintained.

## 2019-10-05 NOTE — BHH Group Notes (Signed)
Overcoming Obstacles  10/05/2019 1PM  Type of Therapy and Topic:  Group Therapy:  Overcoming Obstacles  Participation Level:  Did Not Attend    Description of Group:    In this group patients will be encouraged to explore what they see as obstacles to their own wellness and recovery. They will be guided to discuss their thoughts, feelings, and behaviors related to these obstacles. The group will process together ways to cope with barriers, with attention given to specific choices patients can make. Each patient will be challenged to identify changes they are motivated to make in order to overcome their obstacles. This group will be process-oriented, with patients participating in exploration of their own experiences as well as giving and receiving support and challenge from other group members.   Therapeutic Goals: 1. Patient will identify personal and current obstacles as they relate to admission. 2. Patient will identify barriers that currently interfere with their wellness or overcoming obstacles.  3. Patient will identify feelings, thought process and behaviors related to these barriers. 4. Patient will identify two changes they are willing to make to overcome these obstacles:      Summary of Patient Progress     Therapeutic Modalities:   Cognitive Behavioral Therapy Solution Focused Therapy Motivational Interviewing Relapse Prevention Therapy    Lowella Dandy, MSW, LCSW 10/05/2019 1:51 PM

## 2019-10-05 NOTE — Progress Notes (Signed)
Recreation Therapy Notes   Date: 10/05/2019  Time: 9:30 am  Location: Craft room  Behavioral response: Appropriate  Intervention Topic: Goals   Discussion/Intervention:  Group content on today was focused on goals. Patients described what goals are and how they define goals. Individuals expressed how they go about setting goals and reaching them. The group identified how important goals are and if they make short term goals to reach long term goals. Patients described how many goals they work on at a time and what affects them not reaching their goal. Individuals described how much time they put into planning and obtaining their goals. The group participated in the intervention "My Goal Board" and made personal goal boards to help them achieve their goal. Clinical Observations/Feedback:  Patient came to group and was focused on what peers and staff had to say about goals. She need redirection from writer to focus on the task at hand. Individual was social with peers and staff while participating in the intervention during group.  Arleny Kruger LRT/CTRS           Mirha Brucato 10/05/2019 12:38 PM

## 2019-10-05 NOTE — Plan of Care (Signed)
Patient stated that she had a better day today.Patient is in & out of room.Found patient belongings  everywhere on the floor.Patient continues to have some disorganized speech.States "I know I can go home anytime.But I am going to stay here today."Compliant with morning med.Appetite and energy level good.Denies SI,HI and AVH.Support and encouragement given.

## 2019-10-05 NOTE — Progress Notes (Signed)
D: Patient has been more isolative to room. Calmer. Refused seroquel. Denies SI HI and AVH A: Continue to monitor for safety R: Safety maintained.

## 2019-10-06 MED ORDER — QUETIAPINE FUMARATE 300 MG PO TABS: 300.0000 mg | ORAL_TABLET | Freq: Every day | ORAL | 1 refills | Status: DC

## 2019-10-06 MED ORDER — AMLODIPINE BESYLATE 10 MG PO TABS: 10.0000 mg | ORAL_TABLET | Freq: Every day | ORAL | 1 refills | Status: DC

## 2019-10-06 MED ORDER — DIVALPROEX SODIUM 500 MG PO DR TAB: 1000.0000 mg | DELAYED_RELEASE_TABLET | Freq: Every day | ORAL | 1 refills | Status: DC

## 2019-10-06 NOTE — B[facility] Suicide Risk Assessment (Signed)
[facility] Discharge Suicide Risk Assessment   Principal Problem: Severe manic bipolar 1 disorder with psychotic behavior (HCC) Discharge Diagnoses: Principal Problem:   Severe manic bipolar 1 disorder with psychotic behavior (HCC) Active Problems:   Essential hypertension   Bipolar 1 disorder (HCC)   Total Time spent with patient: 30 minutes  Musculoskeletal: Strength & Muscle Tone: within normal limits Gait & Station: normal Patient leans: N/A  Psychiatric Specialty Exam: Review of Systems  Constitutional: Negative.   HENT: Negative.   Eyes: Negative.   Respiratory: Negative.   Cardiovascular: Negative.   Gastrointestinal: Negative.   Musculoskeletal: Negative.   Skin: Negative.   Neurological: Negative.   Psychiatric/Behavioral: Negative.     Blood pressure (!) 141/81, pulse 83, temperature 98.3 F (36.8 C), temperature source Oral, resp. rate 18, height 5\' 4"  (1.626 m), weight 69.4 kg, SpO2 100 %.Body mass index is 26.26 kg/m.  General Appearance: Casual  Eye Contact::  Fair  Speech:  Clear and Coherent409  Volume:  Normal  Mood:  Euthymic  Affect:  Constricted  Thought Process:  Coherent  Orientation:  Full (Time, Place, and Person)  Thought Content:  Logical  Suicidal Thoughts:  No  Homicidal Thoughts:  No  Memory:  Immediate;   Fair Recent;   Fair Remote;   Fair  Judgement:  Fair  Insight:  Fair  Psychomotor Activity:  Normal  Concentration:  Fair  Recall:  [phone] of Knowledge:Fair  Language: Fair  Akathisia:  No  Handed:  Right  AIMS (if indicated):     Assets:  Desire for Improvement Housing Social Support  Sleep:  Number of Hours: 1.45  Cognition: WNL  ADL's:  Intact   Mental Status Per Nursing Assessment::   On Admission:  NA  Demographic Factors:  NA  Loss Factors: NA  Historical Factors: Impulsivity  Risk Reduction Factors:   Living with another person, especially a relative, Positive social support and Positive therapeutic  relationship  Continued Clinical Symptoms:  Bipolar Disorder:   Mixed State  Cognitive Features That Contribute To Risk:  None    Suicide Risk:  Minimal: No identifiable suicidal ideation.  Patients presenting with no risk factors but with morbid ruminations; may be classified as minimal risk based on the severity of the depressive symptoms  Follow-up Information    [facility] Follow up.   Why: You have a scheduled telehealth appointment with [facility] on Thursday, February 4th at 2pm. Thank you. Contact information: [address], [place] Ph:[phone] Fax:[phone]          Plan Of Care/Follow-up recommendations:  Activity:  Activity as tolerated Diet:  Regular diet Other:  Follow-up with: Outpatient health continue current medication  [id number], MD 10/06/2019, 9:32 AM

## 2019-10-06 NOTE — Plan of Care (Signed)
  Problem: Group Participation Goal: STG - Patient will focus on task/topic with 2 prompts from staff within 5 recreation therapy group sessions Description: STG - Patient will focus on task/topic with 2 prompts from staff within 5 recreation therapy group sessions 10/06/2019 1231 by Alveria Apley, LRT Outcome: Adequate for Discharge 10/06/2019 1231 by Alveria Apley, LRT Outcome: Adequate for Discharge

## 2019-10-06 NOTE — Progress Notes (Signed)
  [facility] Adult Case Management Discharge Plan :  Will you be returning to the same living situation after discharge:  Yes,  home  At discharge, do you have transportation home?: Yes,  usband will pick pt up at 11am Do you have the ability to pay for your medications: Yes,  [employer] medicare  Release of information consent forms completed and in the chart;   Patient to Follow up at: Follow-up Information    [facility] Follow up.   Why: You have a scheduled telehealth appointment with Dr. Gilmore Laroche on Thursday, February 4th at 2pm. Thank you. Contact information: [address] [place], [place] [address] Ph:[phone] Fax:[phone]          Next level of care provider has access to [facility] Link:yes  Safety Planning and Suicide Prevention discussed: Yes,  SPE completed with pts husband     Has patient been referred to the Quitline?: N/A patient is not a smoker  Patient has been referred for addiction treatment: N/A  Mechele Dawley, LCSW 10/06/2019, 9:14 AM

## 2019-10-06 NOTE — Progress Notes (Signed)
Recreation Therapy Notes  INPATIENT RECREATION TR PLAN  Patient Details Name: Heather Marquez MRN: [id number] DOB: [birth year] Today's Date: 10/06/2019  Rec Therapy Plan Is patient appropriate for Therapeutic Recreation?: Yes Treatment times per week: Aat least 3 Estimated Length of Stay: 5-7 days TR Treatment/Interventions: Group participation (Comment)  Discharge Criteria Pt will be discharged from therapy if:: Discharged Treatment plan/goals/alternatives discussed and agreed upon by:: Patient/family  Discharge Summary Short term goals set: Patient will focus on task/topic with 2 prompts from staff within 5 recreation therapy group sessions (Group Participation) Short term goals met: Adequate for discharge Progress toward goals comments: Groups attended Which groups?: Stress management, Goal setting Reason goals not met: N/A Therapeutic equipment acquired: N/A Reason patient discharged from therapy: Discharge from hospital Pt/family agrees with progress & goals achieved: Yes Date patient discharged from therapy: 10/06/19   Aeriel Boulay 10/06/2019, 12:32 PM

## 2019-10-06 NOTE — Progress Notes (Signed)
D: Patient is aware of  Discharge this shift . A:Patient denies suicidal /homicidal ideations. Patient received all belongings brought in   No Storage medications. Writer reviewed Discharge Summary, Suicide Risk Assessment, and Transitional Record. Patient also received Prescriptions   from  MD.  Aware  Of follow up appointment . R: Patient left unit with no questions  Or concerns  With husband

## 2019-10-06 NOTE — Discharge Summary (Signed)
Physician Discharge Summary Note  Patient:  Heather Marquez is an 59 y.o., female MRN:  [id number] DOB:  05/28/1961 Patient phone:  [phone] (home)  Patient address:   [place] [address],  Total Time spent with patient: 30 minutes  Date of Admission:  09/30/2019 Date of Discharge: 10/06/19  Reason for Admission:  59 year old woman with a history of bipolar disorder referred to [place] from [facility] where she was brought by her family.  Patient had just been discharged from old [place] shortly before that.  I spoke with her husband who tells me that the current episode has been going on for about 2 weeks.  She is disorganized in her thinking hyperverbal physically agitated unable to do her usual activities or take care of herself.  There is no known precipitant.    Principal Problem: Severe manic bipolar 1 disorder with psychotic behavior [facility]) Discharge Diagnoses: Principal Problem:   Severe manic bipolar 1 disorder with psychotic behavior ([place]) Active Problems:   Essential hypertension   Bipolar 1 disorder ([place])   Past Psychiatric History: Patient has been living in [place] since 2018.  During that time she has been seeing an outpatient psychiatrist and has been maintained on Depakote as her only psychiatric medicine.  During that whole time it looks like she was stable and never had any hospitalizations until recently.  Husband reports that before that when they lived in [place] she had some other hospitalizations for similar symptoms and has also had depressive episodes in the past.  He knows that she has been on medicines in the past but he cannot remember which ones.  He also unfortunately cannot remember an accurate name for the provider she was seeing.  Denies any history of suicide attempts or violence  Past Medical History:  Past Medical History:  Diagnosis Date  . Bipolar 1 disorder (HCC)    x 21 years  .  Hypertension    unknown   History reviewed. No pertinent surgical history. Family History: History reviewed. No pertinent family history. Family Psychiatric  History: None reported Social History:  Social History   Substance and Sexual Activity  Alcohol Use No     Social History   Substance and Sexual Activity  Drug Use No    Social History   Socioeconomic History  . Marital status: Married    Spouse name: Not on file  . Number of children: Not on file  . Years of education: Not on file  . Highest education level: Not on file  Occupational History  . Not on file  Tobacco Use  . Smoking status: Never Smoker  . Smokeless tobacco: Never Used  Substance and Sexual Activity  . Alcohol use: No  . Drug use: No  . Sexual activity: Yes    Partners: Male  Other Topics Concern  . Not on file  Social History Narrative  . Not on file   Social Determinants of Health   Financial Resource Strain:   . Difficulty of Paying Living Expenses: Not on file  Food Insecurity:   . Worried About Charity fundraiser in the Last Year: Not on file  . Ran Out of Food in the Last Year: Not on file  Transportation Needs:   . Lack of Transportation (Medical): Not on file  . Lack of Transportation (Non-Medical): Not on file  Physical Activity:   . Days of Exercise per Week: Not on file  . Minutes of Exercise  per Session: Not on file  Stress:   . Feeling of Stress : Not on file  Social Connections:   . Frequency of Communication with Friends and Family: Not on file  . Frequency of Social Gatherings with Friends and Family: Not on file  . Attends Religious Services: Not on file  . Active Member of Clubs or Organizations: Not on file  . Attends Banker Meetings: Not on file  . Marital Status: Not on file    Hospital Course:  Patient remained on the [facility] unit for 5 days. The patient stabilized on medication and therapy. Patient was discharged on Norvasc 10 mg PO Daily, Depakote DR  1000 mg PO QHS, Seroquel 300 mg PO QHS,. Patient has shown improvement with improved mood, affect, sleep, appetite, and interaction. Patient has attended group and participated. Patient has been seen in the day room interacting with peers and staff appropriately. Patient denies any SI/HI/AVH and contracts for safety. Patient agrees to follow up at [facility] in [place]. Patient is provided with prescriptions for their medications upon discharge.  Physical Findings: AIMS:  , ,  ,  ,    CIWA:    COWS:     Musculoskeletal: Strength & Muscle Tone: within normal limits Gait & Station: normal Patient leans: N/A  Psychiatric Specialty Exam: Physical Exam  Nursing note and vitals reviewed. Constitutional: She is oriented to person, place, and time. She appears well-developed and well-nourished.  Cardiovascular: Normal rate.  Respiratory: Effort normal.  Musculoskeletal:        General: Normal range of motion.  Neurological: She is alert and oriented to person, place, and time.  Skin: Skin is warm.    Review of Systems  Constitutional: Negative.   HENT: Negative.   Eyes: Negative.   Respiratory: Negative.   Cardiovascular: Negative.   Gastrointestinal: Negative.   Genitourinary: Negative.   Musculoskeletal: Negative.   Skin: Negative.   Neurological: Negative.   Psychiatric/Behavioral: Negative.     Blood pressure (!) 141/81, pulse 83, temperature 98.3 F (36.8 C), temperature source Oral, resp. rate 18, height 5\' 4"  (1.626 m), weight 69.4 kg, SpO2 100 %.Body mass index is 26.26 kg/m.   General Appearance: Casual  Eye Contact::  Fair  Speech:  Clear and Coherent409  Volume:  Normal  Mood:  Euthymic  Affect:  Constricted  Thought Process:  Coherent  Orientation:  Full (Time, Place, and Person)  Thought Content:  Logical  Suicidal Thoughts:  No  Homicidal Thoughts:  No  Memory:  Immediate;   Fair Recent;   Fair Remote;   Fair  Judgement:   Fair  Insight:  Fair  Psychomotor Activity:  Normal  Concentration:  Fair  Recall:  [phone] of Knowledge:Fair  Language: Fair  Akathisia:  No  Handed:  Right  AIMS (if indicated):     Assets:  Desire for Improvement Housing Social Support  Sleep:  Number of Hours: 1.45  Cognition: WNL  ADL's:  Intact      Has this patient used any form of tobacco in the last 30 days? (Cigarettes, Smokeless Tobacco, Cigars, and/or Pipes) Yes, No  Blood Alcohol level:  Lab Results  Component Value Date   ETH <10 09/29/2019    Metabolic Disorder Labs:  Lab Results  Component Value Date   HGBA1C 5.6 10/01/2019   MPG 114.02 10/01/2019   No results found for: PROLACTIN Lab Results  Component Value Date   CHOL 160 10/01/2019  TRIG 46 10/01/2019   HDL 71 10/01/2019   CHOLHDL 2.3 10/01/2019   VLDL 9 10/01/2019   LDLCALC 80 10/01/2019    See Psychiatric Specialty Exam and Suicide Risk Assessment completed by Attending Physician prior to discharge.  Discharge destination:  Home  Is patient on multiple antipsychotic therapies at discharge:  No   Has Patient had three or more failed trials of antipsychotic monotherapy by history:  No  Recommended Plan for Multiple Antipsychotic Therapies: NA  Discharge Instructions    Diet - low sodium heart healthy   Complete by: As directed    Increase activity slowly   Complete by: As directed      Allergies as of 10/06/2019      Reactions   Mango Flavor Rash      Medication List    STOP taking these medications   diclofenac 75 MG EC tablet Commonly known as: VOLTAREN   FLAXSEED (LINSEED) PO   losartan 25 MG tablet Commonly known as: COZAAR     TAKE these medications     Indication  amLODipine 10 MG tablet Commonly known as: NORVASC Take 1 tablet (10 mg total) by mouth daily.  Indication: High Blood Pressure Disorder   divalproex 500 MG DR tablet Commonly known as: DEPAKOTE Take 2 tablets (1,000 mg total) by mouth at  bedtime.  Indication: Manic Phase of Manic-Depression   QUEtiapine 300 MG tablet Commonly known as: SEROQUEL Take 1 tablet (300 mg total) by mouth at bedtime.  Indication: Manic Phase of Manic-Depression   rosuvastatin 5 MG tablet Commonly known as: CRESTOR Take 5 mg by mouth daily.  Indication: High Amount of Fats in the Blood      Follow-up Information    [facility] Follow up.   Why: You have a scheduled telehealth appointment with Dr. Gilmore Laroche on Thursday, February 4th at 2pm. Thank you. Contact information: [address] [place], [place] [address] Ph:[phone] Fax:[phone]          Follow-up recommendations:  Continue activity as tolerated. Continue diet as recommended by your PCP. Ensure to keep all appointments with outpatient providers.  Comments:  Patient is instructed prior to discharge to: Take all medications as prescribed by his/her mental healthcare provider. Report any adverse effects and or reactions from the medicines to his/her outpatient provider promptly. Patient has been instructed & cautioned: To not engage in alcohol and or illegal drug use while on prescription medicines. In the event of worsening symptoms, patient is instructed to call the crisis hotline, 911 and or go to the nearest ED for appropriate evaluation and treatment of symptoms. To follow-up with his/her primary care provider for your other medical issues, concerns and or health care needs.    Signed: Gerlene Burdock [place] Lozito, FNP 10/06/2019, 9:29 AM

## 2019-10-06 NOTE — Progress Notes (Signed)
Patient was restless throughout the night, unable to stay asleep. Frequently coming to the nurses station to asking what time it is. Restless and anxious but resistant to care.

## 2019-10-06 NOTE — Plan of Care (Signed)
  Problem: Education: Goal: Knowledge of [facility] Education information/materials will improve Outcome: Adequate for Discharge   Problem: Coping: Goal: Ability to verbalize frustrations and anger appropriately will improve Outcome: Adequate for Discharge Goal: Ability to demonstrate self-control will improve Outcome: Adequate for Discharge   Problem: Activity: Goal: Will verbalize the importance of balancing activity with adequate rest periods Outcome: Adequate for Discharge   Problem: Education: Goal: Will be free of psychotic symptoms Outcome: Adequate for Discharge   Problem: Health Behavior/Discharge Planning: Goal: Compliance with prescribed medication regimen will improve Outcome: Adequate for Discharge   Problem: Role Relationship: Goal: Ability to communicate needs accurately will improve Outcome: Adequate for Discharge Goal: Ability to interact with others will improve Outcome: Adequate for Discharge   Problem: Education: Goal: Knowledge of the prescribed therapeutic regimen will improve Outcome: Adequate for Discharge   Problem: Self-Concept: Goal: Level of anxiety will decrease Outcome: Adequate for Discharge

## 2019-10-06 NOTE — Tx Team (Signed)
Interdisciplinary Treatment and Diagnostic Plan Update  10/06/2019 Time of Session: [place] MRN: [id number]  Principal Diagnosis: Severe manic bipolar 1 disorder with psychotic behavior ([place])  Secondary Diagnoses: Principal Problem:   Severe manic bipolar 1 disorder with psychotic behavior ([place]) Active Problems:   Essential hypertension   Bipolar 1 disorder ([place])   Current Medications:  Current Facility-Administered Medications  Medication Dose Route Frequency Provider Last Rate Last Admin  . acetaminophen (TYLENOL) tablet 650 mg  650 mg Oral Q6H PRN Cristofano, Dorene Ar, MD      . alum & mag hydroxide-simeth (MAALOX/MYLANTA) 200-200-20 MG/5ML suspension 30 mL  30 mL Oral Q4H PRN Cristofano, Paul A, MD      . amLODipine (NORVASC) tablet 10 mg  10 mg Oral Daily Clapacs, Madie Reno, MD   10 mg at 10/05/19 0813  . divalproex (DEPAKOTE) DR tablet 1,000 mg  1,000 mg Oral QHS Clapacs, John T, MD   1,000 mg at 10/05/19 2145  . magnesium hydroxide (MILK OF MAGNESIA) suspension 30 mL  30 mL Oral Daily PRN Cristofano, Dorene Ar, MD      . neomycin-bacitracin-polymyxin (NEOSPORIN) ointment packet   Topical PRN Dixon, Ernst Bowler, NP      . QUEtiapine (SEROQUEL) tablet 300 mg  300 mg Oral QHS Sharma Covert, MD      . traMADol Veatrice Bourbon) tablet 50 mg  50 mg Oral Q6H PRN Sharma Covert, MD      . vitamin B-12 (CYANOCOBALAMIN) tablet 1,000 mcg  1,000 mcg Oral Daily Sharma Covert, MD   1,000 mcg at 10/05/19 0813   PTA Medications: Medications Prior to Admission  Medication Sig Dispense Refill Last Dose  . diclofenac (VOLTAREN) 75 MG EC tablet Take 75 mg by mouth daily as needed for mild pain.      Marland Kitchen FLAXSEED, LINSEED, PO Take 1 tablet by mouth daily.     Marland Kitchen losartan (COZAAR) 25 MG tablet Take 25 mg by mouth daily.     . rosuvastatin (CRESTOR) 5 MG tablet Take 5 mg by mouth daily.      . [DISCONTINUED] amLODipine (NORVASC) 10 MG tablet Take 10 mg by mouth daily.      . [DISCONTINUED]  divalproex (DEPAKOTE) 500 MG DR tablet Take 2 tablets (1,000 mg total) by mouth at bedtime. 180 tablet 0     Patient Stressors: Financial difficulties Marital or family conflict Medication change or noncompliance  Patient Strengths: Active sense of humor Average or above average intelligence Capable of independent living Motivation for treatment/growth  Treatment Modalities: Medication Management, Group therapy, Case management,  1 to 1 session with clinician, Psychoeducation, Recreational therapy.   Physician Treatment Plan for Primary Diagnosis: Severe manic bipolar 1 disorder with psychotic behavior ([place]) Long Term Goal(s): Improvement in symptoms so as ready for discharge Improvement in symptoms so as ready for discharge   Short Term Goals: Ability to verbalize feelings will improve Ability to demonstrate self-control will improve Ability to maintain clinical measurements within normal limits will improve Compliance with prescribed medications will improve  Medication Management: Evaluate patient's response, side effects, and tolerance of medication regimen.  Therapeutic Interventions: 1 to 1 sessions, Unit Group sessions and Medication administration.  Evaluation of Outcomes: Adequate for Discharge  Physician Treatment Plan for Secondary Diagnosis: Principal Problem:   Severe manic bipolar 1 disorder with psychotic behavior ([place]) Active Problems:   Essential hypertension   Bipolar 1 disorder ([place])  Long Term Goal(s): Improvement in symptoms so as ready for  discharge Improvement in symptoms so as ready for discharge   Short Term Goals: Ability to verbalize feelings will improve Ability to demonstrate self-control will improve Ability to maintain clinical measurements within normal limits will improve Compliance with prescribed medications will improve     Medication Management: Evaluate patient's response, side effects, and tolerance of medication  regimen.  Therapeutic Interventions: 1 to 1 sessions, Unit Group sessions and Medication administration.  Evaluation of Outcomes: Adequate for Discharge   RN Treatment Plan for Primary Diagnosis: Severe manic bipolar 1 disorder with psychotic behavior (HCC) Long Term Goal(s): Knowledge of disease and therapeutic regimen to maintain health will improve  Short Term Goals: Ability to participate in decision making will improve, Ability to verbalize feelings will improve, Ability to identify and develop effective coping behaviors will improve and Compliance with prescribed medications will improve  Medication Management: RN will administer medications as ordered by provider, will assess and evaluate patient's response and provide education to patient for prescribed medication. RN will report any adverse and/or side effects to prescribing provider.  Therapeutic Interventions: 1 on 1 counseling sessions, Psychoeducation, Medication administration, Evaluate responses to treatment, Monitor vital signs and CBGs as ordered, Perform/monitor CIWA, COWS, AIMS and Fall Risk screenings as ordered, Perform wound care treatments as ordered.  Evaluation of Outcomes: Adequate for Discharge   LCSW Treatment Plan for Primary Diagnosis: Severe manic bipolar 1 disorder with psychotic behavior (HCC) Long Term Goal(s): Safe transition to appropriate next level of care at discharge, Engage patient in therapeutic group addressing interpersonal concerns.  Short Term Goals: Engage patient in aftercare planning with referrals and resources, Increase emotional regulation, Facilitate acceptance of mental health diagnosis and concerns and Increase skills for wellness and recovery  Therapeutic Interventions: Assess for all discharge needs, 1 to 1 time with Social worker, Explore available resources and support systems, Assess for adequacy in community support network, Educate family and significant other(s) on suicide  prevention, Complete Psychosocial Assessment, Interpersonal group therapy.  Evaluation of Outcomes: Adequate for Discharge   Progress in Treatment: Attending groups: No. Participating in groups: No. Taking medication as prescribed: Yes. Toleration medication: Yes. Family/Significant other contact made: Yes, individual(s) contacted:  pts husband Patient understands diagnosis: Yes. Discussing patient identified problems/goals with staff: Yes. Medical problems stabilized or resolved: Yes. Denies suicidal/homicidal ideation: Yes. Issues/concerns per patient self-inventory: No. Other: NA  New problem(s) identified: No, Describe:  None reported  New Short Term/Long Term Goal(s):Attend outpatient treatment, take medication as prescribed, develop and implement healthy coping methods  Patient Goals:  "Make sure I get rest"  Discharge Plan or Barriers: Patient will return home and follow up with outpatient treatment. Update 10/06/19: SPE pamphlet, Mobile Crisis information, and AA/NA information provided to patient for additional community support and resources. Pt has an appointment scheduled with [facility] outpatient on 2/4 at 2pm.  Reason for Continuation of Hospitalization: Medication stabilization  Estimated Length of Stay: Today 10/06/19  Recreational Therapy: Patient: N/A Patient Goal: Patient will engage in groups without prompting or encouragement from LRT x3 group sessions within 5 recreation therapy group sessions.  Attendees: Patient: 10/06/2019 10:03 AM  Physician: Mordecai Rasmussen 10/06/2019 10:03 AM  Nursing:  10/06/2019 10:03 AM  RN Care Manager: 10/06/2019 10:03 AM  Social Worker: Lowella Dandy Chapin Elaysha Bevard Penni Homans 10/06/2019 10:03 AM  Recreational Therapist:  10/06/2019 10:03 AM  Other:  10/06/2019 10:03 AM  Other:  10/06/2019 10:03 AM  Other: 10/06/2019 10:03 AM    Scribe for Treatment Team: Charlann Lange  Addysin Porco, LCSW 10/06/2019 10:03 AM

## 2019-10-06 NOTE — Plan of Care (Signed)
Patient has been restless with disorganized thinking/bizarre behaviors. Has been taking her pants off and walking in the room naked. 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 Patient took Depakote after multiple attempts. Refused Seroquel. Patient is messy and unable to keep her room clean. She is restless and anxious but denial. Currently in room, sleeping on the floor. Safety precautions reinforced.

## 2019-10-06 NOTE — Progress Notes (Signed)
Recreation Therapy Notes   Date: 10/06/2019  Time: 9:30 am   Location: Craft room   Behavioral response: N/A   Intervention Topic: Coping Skills   Discussion/Intervention: Patient did not attend group.   Clinical Observations/Feedback:  Patient did not attend group.   Taunja Brickner LRT/CTRS        Korynn Kenedy 10/06/2019 12:33 PM

## 2019-10-07 ENCOUNTER — Other Ambulatory Visit: Payer: Self-pay | Admitting: Psychiatry

## 2019-10-07 MED ORDER — QUETIAPINE FUMARATE 300 MG PO TABS: 300.0000 mg | ORAL_TABLET | Freq: Every day | ORAL | 1 refills | Status: DC

## 2019-10-07 MED ORDER — AMLODIPINE BESYLATE 10 MG PO TABS: 10.0000 mg | ORAL_TABLET | Freq: Every day | ORAL | 1 refills | Status: AC

## 2019-10-07 MED ORDER — ROSUVASTATIN CALCIUM 5 MG PO TABS: 5.0000 mg | ORAL_TABLET | Freq: Every day | ORAL | 1 refills | Status: AC

## 2019-10-07 MED ORDER — DIVALPROEX SODIUM 500 MG PO DR TAB: 1000.0000 mg | DELAYED_RELEASE_TABLET | Freq: Every day | ORAL | 1 refills | Status: DC

## 2019-10-08 ENCOUNTER — Ambulatory Visit (HOSPITAL_COMMUNITY): Payer: Medicare HMO | Admitting: Psychiatry

## 2019-10-15 ENCOUNTER — Encounter (HOSPITAL_COMMUNITY): Payer: Self-pay | Admitting: Psychiatry

## 2019-10-15 ENCOUNTER — Other Ambulatory Visit: Payer: Self-pay

## 2019-10-15 ENCOUNTER — Ambulatory Visit (INDEPENDENT_AMBULATORY_CARE_PROVIDER_SITE_OTHER): Payer: Medicare HMO | Admitting: Psychiatry

## 2019-10-15 DIAGNOSIS — F319 Bipolar disorder, unspecified: Secondary | ICD-10-CM

## 2019-10-15 NOTE — Progress Notes (Signed)
[facility] Outpatient Follow up visit  Patient Identification: Heather Marquez MRN:  [id number] Date of Evaluation:  10/15/2019 Referral Source: [facility] mental health Chief Complaint:   fu bipolar. Hospital discharge Visit Diagnosis:    ICD-10-CM   1. Bipolar I disorder (HCC)  F31.9      I connected with Heather Marquez on 10/15/19 at  2:00 PM EST by telephone and verified that I am speaking with the correct person using two identifiers.   I discussed the limitations, risks, security and privacy concerns of performing an evaluation and management service by telephone and the availability of in person appointments. I also discussed with the patient that there may be a patient responsible charge related to this service. The patient expressed understanding and agreed to proceed.  History of Present Illness:  59 years old currently married African-American female initially referred by [facility] committee mental healthcare after her relocation here.  Last time was doing fair but has got admitted January 21st.  As per notes reviewed she was singing rhymig and presented  to the emergency department for psychiatric evaluation. She states that she was recommended to be assessed by her psychiatrist as well as her primary care doctor. She states that she has been compliant with her medications. Feels that her medications need to be changed, but is unable to specify why she believes this is the case. She states that "nothing is wrong with me". She is very agitated. Just was discharged from [facility] following placement; was IVC'd at [facility] on 09/18/19. Denies SI/HI.  She was dysfunctional according to notes and was admitted   Today she does state that for some reason she has stopped taking medications. She has been non compliant in the past. She was stabilized with depakote and was also given seroquel at night She states she is not taking seroquel as doc said to take if need at night.  According to  discharge summary she was discharged on this dose  She remains somewhat guarded and wanting not to take it regularly Explained she has been stable at current dose and she said she may take half of seroquel She feels doing fair since discharge but not taking seroquel nor has she picked from pharmacy, I spent time explaining compliance and to be regular with meds so she remains stable.  Did not endorse side effects when taking  Otherwise was alert and cooperative not agitated husband is supportive   Aggravating factor: non compliant history Modifying factor: husband Past Psychiatric History: bipolar, manic as per admission history  Previous Psychotropic Medications: Yes     Family History: History reviewed. No pertinent family history.  Social History:   Social History   Socioeconomic History  . Marital status: Married    Spouse name: Not on file  . Number of children: Not on file  . Years of education: Not on file  . Highest education level: Not on file  Occupational History  . Not on file  Tobacco Use  . Smoking status: Never Smoker  . Smokeless tobacco: Never Used  Substance and Sexual Activity  . Alcohol use: No  . Drug use: No  . Sexual activity: Yes    Partners: Male  Other Topics Concern  . Not on file  Social History Narrative  . Not on file   Social Determinants of Health   Financial Resource Strain:   . Difficulty of Paying Living Expenses: Not on file  Food Insecurity:   . Worried About Programme researcher, broadcasting/film/video in  the Last Year: Not on file  . Ran Out of Food in the Last Year: Not on file  Transportation Needs:   . Lack of Transportation (Medical): Not on file  . Lack of Transportation (Non-Medical): Not on file  Physical Activity:   . Days of Exercise per Week: Not on file  . Minutes of Exercise per Session: Not on file  Stress:   . Feeling of Stress : Not on file  Social Connections:   . Frequency of Communication with Friends and Family: Not on file   . Frequency of Social Gatherings with Friends and Family: Not on file  . Attends Religious Services: Not on file  . Active Member of Clubs or Organizations: Not on file  . Attends Banker Meetings: Not on file  . Marital Status: Not on file      Allergies:   Allergies  Allergen Reactions  . Mango Flavor Rash    Metabolic Disorder Labs: Lab Results  Component Value Date   HGBA1C 5.6 10/01/2019   MPG 114.02 10/01/2019   No results found for: PROLACTIN Lab Results  Component Value Date   CHOL 160 10/01/2019   TRIG 46 10/01/2019   HDL 71 10/01/2019   CHOLHDL 2.3 10/01/2019   VLDL 9 10/01/2019   LDLCALC 80 10/01/2019     Current Medications: Current Outpatient Medications  Medication Sig Dispense Refill  . amLODipine (NORVASC) 10 MG tablet Take 1 tablet (10 mg total) by mouth daily. 30 tablet 1  . divalproex (DEPAKOTE) 500 MG DR tablet Take 2 tablets (1,000 mg total) by mouth at bedtime. 60 tablet 1  . QUEtiapine (SEROQUEL) 300 MG tablet Take 1 tablet (300 mg total) by mouth at bedtime. 30 tablet 1  . rosuvastatin (CRESTOR) 5 MG tablet Take 1 tablet (5 mg total) by mouth daily. 30 tablet 1   No current facility-administered medications for this visit.      Psychiatric Specialty Exam: Review of Systems  Cardiovascular: Negative for chest pain.  Psychiatric/Behavioral: Negative for depression.    There were no vitals taken for this visit.There is no height or weight on file to calculate BMI.  General Appearance:   Eye Contact:    Speech:  Low   Volume:  Decreased  Mood: fair  Affect:   Thought Process:  Goal Directed  Orientation:  Full (Time, Place, and Person)  Thought Content:  Rumination  Suicidal Thoughts:  No  Homicidal Thoughts:  No  Memory:  Immediate;   Fair Recent;   Fair  Judgement:  Fair  Insight:  Fair  Psychomotor Activity:    Concentration:  Concentration: Fair and Attention Span: Fair  Recall:  [employer] of Knowledge:Fair   Language: Fair  Akathisia:    Handed:  Right  AIMS (if indicated): no involuntary movements  Assets:  Desire for Improvement Social Support  ADL's:  Intact  Cognition: WNL  Sleep:  fair    Treatment Plan Summary: Medication management and Plan as follows   1. Bipolar per history:  Fair but somewhat resistent to take seroquel. Discussed compliance she will pick depakote and seroquel and she plans to take half of 300mg  . Did explain she may be at risk if she is not taking prescribed meds  2. Anxiety; not worse,  3. Sleep: doing fair Mostly discussed compliance and to call back for any concerns will fu again within few weeks.  She is not interested for any higher level of care and states will take  her meds.  Discharge summary and notes reviewed  I discussed the assessment and treatment plan with the patient. The patient was provided an opportunity to ask questions and all were answered. The patient agreed with the plan and demonstrated an understanding of the instructions.   The patient was advised to call back or seek an in-person evaluation if the symptoms worsen or if the condition fails to improve as anticipated.

## 2019-11-04 ENCOUNTER — Ambulatory Visit (HOSPITAL_COMMUNITY): Payer: Medicare HMO | Admitting: Psychiatry

## 2019-11-04 ENCOUNTER — Other Ambulatory Visit: Payer: Self-pay

## 2019-11-17 ENCOUNTER — Ambulatory Visit (HOSPITAL_COMMUNITY): Payer: Medicare HMO | Admitting: Psychiatry

## 2019-11-23 ENCOUNTER — Encounter (HOSPITAL_COMMUNITY): Payer: Self-pay | Admitting: Psychiatry

## 2019-11-23 ENCOUNTER — Ambulatory Visit (INDEPENDENT_AMBULATORY_CARE_PROVIDER_SITE_OTHER): Payer: Medicare HMO | Admitting: Psychiatry

## 2019-11-23 ENCOUNTER — Telehealth (HOSPITAL_COMMUNITY): Payer: Self-pay | Admitting: Psychiatry

## 2019-11-23 DIAGNOSIS — F319 Bipolar disorder, unspecified: Secondary | ICD-10-CM | POA: Diagnosis not present

## 2019-11-23 DIAGNOSIS — F419 Anxiety disorder, unspecified: Secondary | ICD-10-CM | POA: Diagnosis not present

## 2019-11-23 NOTE — Progress Notes (Signed)
[facility] Outpatient Follow up visit  Patient Identification: Heather Marquez MRN:  [id number] Date of Evaluation:  11/23/2019 Referral Source: [facility] mental health Chief Complaint:    Fu Bipolar Visit Diagnosis:    ICD-10-CM   1. Bipolar I disorder (Early)  F31.9      I connected with Assunta Found on 11/23/19 at  3:45 PM EDT by telephone and verified that I am speaking with the correct person using two identifiers.   I discussed the limitations, risks, security and privacy concerns of performing an evaluation and management service by telephone and the availability of in person appointments. I also discussed with the patient that there may be a patient responsible charge related to this service. The patient expressed understanding and agreed to proceed.  History of Present Illness:  59  years old currently married African-American female initially referred by [facility] committee mental healthcare after her relocation here.  Last visit was after hospital discharge she was resisting taking seroquel and said will take some and continued depakote   Says doing fair, not taking seroquel, does not want to take it says it makes her sleepy  We discussed to take half dose but still has stopped but continues to take depakote  Denies side effects Says she feels fine,  Husband was not home so we discussed if he can connect with [place] or leave message of how you are doing  Does not want to get depakote levels, says dont have money , we discussed the risks and side effects   She remains somewhat guarded about compliance and doses   Otherwise was alert and cooperative not agitated Husband described  As  supportive   Aggravating factor: non compliant history Modifying factor: husband Past Psychiatric History: bipolar, manic as per admission history  Previous Psychotropic Medications: Yes     Family History: History reviewed. No pertinent family history.  Social History:   Social History    Socioeconomic History  . Marital status: Married    Spouse name: Not on file  . Number of children: Not on file  . Years of education: Not on file  . Highest education level: Not on file  Occupational History  . Not on file  Tobacco Use  . Smoking status: Never Smoker  . Smokeless tobacco: Never Used  Substance and Sexual Activity  . Alcohol use: No  . Drug use: No  . Sexual activity: Yes    Partners: Male  Other Topics Concern  . Not on file  Social History Narrative  . Not on file   Social Determinants of Health   Financial Resource Strain:   . Difficulty of Paying Living Expenses:   Food Insecurity:   . Worried About Charity fundraiser in the Last Year:   . Arboriculturist in the Last Year:   Transportation Needs:   . Film/video editor (Medical):   Marland Kitchen Lack of Transportation (Non-Medical):   Physical Activity:   . Days of Exercise per Week:   . Minutes of Exercise per Session:   Stress:   . Feeling of Stress :   Social Connections:   . Frequency of Communication with Friends and Family:   . Frequency of Social Gatherings with Friends and Family:   . Attends Religious Services:   . Active Member of Clubs or Organizations:   . Attends Archivist Meetings:   Marland Kitchen Marital Status:       Allergies:   Allergies  Allergen Reactions  . Mango  Flavor Rash    Metabolic Disorder Labs: Lab Results  Component Value Date   HGBA1C 5.6 10/01/2019   MPG 114.02 10/01/2019   No results found for: PROLACTIN Lab Results  Component Value Date   CHOL 160 10/01/2019   TRIG 46 10/01/2019   HDL 71 10/01/2019   CHOLHDL 2.3 10/01/2019   VLDL 9 10/01/2019   LDLCALC 80 10/01/2019     Current Medications: Current Outpatient Medications  Medication Sig Dispense Refill  . amLODipine (NORVASC) 10 MG tablet Take 1 tablet (10 mg total) by mouth daily. 30 tablet 1  . divalproex (DEPAKOTE) 500 MG DR tablet Take 2 tablets (1,000 mg total) by mouth at bedtime. 60  tablet 1  . QUEtiapine (SEROQUEL) 300 MG tablet Take 1 tablet (300 mg total) by mouth at bedtime. 30 tablet 1  . rosuvastatin (CRESTOR) 5 MG tablet Take 1 tablet (5 mg total) by mouth daily. 30 tablet 1   No current facility-administered medications for this visit.      Psychiatric Specialty Exam: Review of Systems  Cardiovascular: Negative for chest pain.  Psychiatric/Behavioral: Negative for depression.    There were no vitals taken for this visit.There is no height or weight on file to calculate BMI.  General Appearance:   Eye Contact:    Speech:  Low   Volume:  Decreased  Mood:fair  Affect:   Thought Process:  Goal Directed  Orientation:  Full (Time, Place, and Person)  Thought Content:  Rumination  Suicidal Thoughts:  No  Homicidal Thoughts:  No  Memory:  Immediate;   Fair Recent;   Fair  Judgement:  Fair  Insight:  Fair  Psychomotor Activity:    Concentration:  Concentration: Fair and Attention Span: Fair  Recall:  [employer] of Knowledge:Fair  Language: Fair  Akathisia:    Handed:  Right  AIMS (if indicated): no involuntary movements  Assets:  Desire for Improvement Social Support  ADL's:  Intact  Cognition: WNL  Sleep:  fair    Treatment Plan Summary: Medication management and Plan as follows   1. Bipolar per history: does not want to be on seroquel or any other. Except deakpte she takes 2 at night, does not want to get blood work, denies side effects Asked if husband can call and communicate [place] how she is doing, she said she will get him to call us Says depakote helps sleep and mood.  2. Anxiety; not worse,  3. Sleep: doing fair Mostly discussed compliance and to call back for any concerns will fu again within few weeks.  She is not interested for any higher level of care and states will take her meds.   time spent not face to face Fu 4-6w has meds for now  I discussed the assessment and treatment plan with the patient. The patient was  provided an opportunity to ask questions and all were answered. The patient agreed with the plan and demonstrated an understanding of the instructions.   The patient was advised to call back or seek an in-person evaluation if the symptoms worsen or if the condition fails to improve as anticipated.

## 2019-11-23 NOTE — Telephone Encounter (Signed)
Discussed in detail during appointment but says was not taking seroquel. Please confirm and also husband needs to leave or communicate a message with our staff of how she is doing.

## 2019-11-23 NOTE — Telephone Encounter (Signed)
Pt states she needs a refill for seroquel 50mg  Order

## 2019-11-24 ENCOUNTER — Encounter (HOSPITAL_COMMUNITY): Payer: Self-pay | Admitting: Psychiatry

## 2019-11-24 MED ORDER — QUETIAPINE FUMARATE 50 MG PO TABS: 50.0000 mg | ORAL_TABLET | Freq: Every day | ORAL | 0 refills | Status: DC

## 2019-11-24 NOTE — Telephone Encounter (Signed)
Spoke to patient. She has not been taking it because how it makes her feel. She states you talked to her yesterday and wanted her to try it. And she refused. She has changed her mind and would like to try the 50mg   [facility]   Husband was not available to talk to .

## 2019-11-24 NOTE — Telephone Encounter (Signed)
Ok sent!

## 2019-12-29 ENCOUNTER — Other Ambulatory Visit (HOSPITAL_COMMUNITY): Payer: Self-pay | Admitting: Psychiatry

## 2020-01-07 ENCOUNTER — Ambulatory Visit (HOSPITAL_COMMUNITY): Payer: Medicare HMO | Admitting: Psychiatry

## 2020-01-12 ENCOUNTER — Telehealth (INDEPENDENT_AMBULATORY_CARE_PROVIDER_SITE_OTHER): Payer: Medicare HMO | Admitting: Psychiatry

## 2020-01-12 ENCOUNTER — Encounter (HOSPITAL_COMMUNITY): Payer: Self-pay | Admitting: Psychiatry

## 2020-01-12 DIAGNOSIS — F319 Bipolar disorder, unspecified: Secondary | ICD-10-CM | POA: Diagnosis not present

## 2020-01-12 MED ORDER — QUETIAPINE FUMARATE 50 MG PO TABS: 50.0000 mg | ORAL_TABLET | Freq: Every day | ORAL | 1 refills | Status: DC

## 2020-01-12 NOTE — Progress Notes (Signed)
[facility] Outpatient Follow up visit  Patient Identification: Heather Marquez MRN:  [id number] Date of Evaluation:  01/12/2020 Referral Source: [facility] mental health Chief Complaint:    Bipolar follow up  Visit Diagnosis:    ICD-10-CM   1. Bipolar I disorder (HCC)  F31.9 Valproic acid level     I connected with [name] on 01/12/20 at  4:00 PM EDT by telephone and verified that I am speaking with the correct person using two identifiers.  I discussed the limitations, risks, security and privacy concerns of performing an evaluation and management service by telephone and the availability of in person appointments. I also discussed with the patient that there may be a patient responsible charge related to this service. The patient expressed understanding and agreed to proceed.  History of Present Illness:  59  years old currently married African-American female initially referred by [facility] committee mental healthcare after her relocation here.  Doing fair, she has started low dose seroquel at night as well continued Depakote. Husband did not call as we requested but descrbies managing well and working Part time  Not got blood work done says may get soon. Will re send request  Denies side effects Says she feels fine,  Husband was not home   She remains somewhat guarded about compliance in the past Otherwise was alert and cooperative not agitated  Aggravating factor: non compliant history Modifying factor: husband Past Psychiatric History: bipolar, manic as per admission history  Previous Psychotropic Medications: Yes     Family History: History reviewed. No pertinent family history.  Social History:   Social History   Socioeconomic History  . Marital status: Married    Spouse name: Not on file  . Number of children: Not on file  . Years of education: Not on file  . Highest education level: Not on file  Occupational History  . Not on file  Tobacco Use  . Smoking  status: Never Smoker  . Smokeless tobacco: Never Used  Substance and Sexual Activity  . Alcohol use: No  . Drug use: No  . Sexual activity: Yes    Partners: Male  Other Topics Concern  . Not on file  Social History Narrative  . Not on file   Social Determinants of Health   Financial Resource Strain:   . Difficulty of Paying Living Expenses:   Food Insecurity:   . Worried About Programme researcher, broadcasting/film/video in the Last Year:   . Barista in the Last Year:   Transportation Needs:   . Freight forwarder (Medical):   Marland Kitchen Lack of Transportation (Non-Medical):   Physical Activity:   . Days of Exercise per Week:   . Minutes of Exercise per Session:   Stress:   . Feeling of Stress :   Social Connections:   . Frequency of Communication with Friends and Family:   . Frequency of Social Gatherings with Friends and Family:   . Attends Religious Services:   . Active Member of Clubs or Organizations:   . Attends Banker Meetings:   Marland Kitchen Marital Status:       Allergies:   Allergies  Allergen Reactions  . Mango Flavor Rash    Metabolic Disorder Labs: Lab Results  Component Value Date   HGBA1C 5.6 10/01/2019   MPG 114.02 10/01/2019   No results found for: PROLACTIN Lab Results  Component Value Date   CHOL 160 10/01/2019   TRIG 46 10/01/2019   HDL 71 10/01/2019  CHOLHDL 2.3 10/01/2019   VLDL 9 10/01/2019   LDLCALC 80 10/01/2019     Current Medications: Current Outpatient Medications  Medication Sig Dispense Refill  . amLODipine (NORVASC) 10 MG tablet Take 1 tablet (10 mg total) by mouth daily. 30 tablet 1  . divalproex (DEPAKOTE) 500 MG DR tablet TAKE 2 TABLETS AT BEDTIME 60 tablet 0  . QUEtiapine (SEROQUEL) 50 MG tablet Take 1 tablet (50 mg total) by mouth at bedtime. 30 tablet 1  . rosuvastatin (CRESTOR) 5 MG tablet Take 1 tablet (5 mg total) by mouth daily. 30 tablet 1   No current facility-administered medications for this visit.      Psychiatric  Specialty Exam: Review of Systems  Cardiovascular: Negative for chest pain.  Psychiatric/Behavioral: Negative for depression.    There were no vitals taken for this visit.There is no height or weight on file to calculate BMI.  General Appearance:   Eye Contact:    Speech:  Low   Volume:  Decreased  Mood:fair  Affect:   Thought Process:  Goal Directed  Orientation:  Full (Time, Place, and Person)  Thought Content:  Rumination  Suicidal Thoughts:  No  Homicidal Thoughts:  No  Memory:  Immediate;   Fair Recent;   Fair  Judgement:  Fair  Insight:  Fair  Psychomotor Activity:    Concentration:  Concentration: Fair and Attention Span: Fair  Recall:  [employer] of Knowledge:Fair  Language: Fair  Akathisia:    Handed:  Right  AIMS (if indicated): no involuntary movements  Assets:  Desire for Improvement Social Support  ADL's:  Intact  Cognition: WNL  Sleep:  fair    Treatment Plan Summary: Medication management and Plan as follows   1. Bipolar per history: fair, continue depakote, lab work requested again, continue low dose seroquel . No tremors  2. Anxiety; not worse,  3. Sleep: doing fair Mostly discussed compliance and to call back for any concerns  She is not interested for any higher level of care and states will continue take her meds.   time spent not face to face 54min Fu 6w . Refills sent I discussed the assessment and treatment plan with the patient. The patient was provided an opportunity to ask questions and all were answered. The patient agreed with the plan and demonstrated an understanding of the instructions.   The patient was advised to call back or seek an in-person evaluation if the symptoms worsen or if the condition fails to improve as anticipated.

## 2020-01-22 ENCOUNTER — Other Ambulatory Visit (HOSPITAL_COMMUNITY): Payer: Self-pay | Admitting: Psychiatry

## 2020-01-27 LAB — VALPROIC ACID LEVEL: Valproic Acid Lvl: 52.3 mg/L (ref 50.0–100.0)

## 2020-03-15 ENCOUNTER — Encounter (HOSPITAL_COMMUNITY): Payer: Self-pay | Admitting: Psychiatry

## 2020-03-15 ENCOUNTER — Telehealth (INDEPENDENT_AMBULATORY_CARE_PROVIDER_SITE_OTHER): Payer: Medicare HMO | Admitting: Psychiatry

## 2020-03-15 DIAGNOSIS — F319 Bipolar disorder, unspecified: Secondary | ICD-10-CM | POA: Diagnosis not present

## 2020-03-15 MED ORDER — DIVALPROEX SODIUM 500 MG PO DR TAB: 1000.0000 mg | DELAYED_RELEASE_TABLET | Freq: Every day | ORAL | 1 refills | Status: DC

## 2020-03-15 NOTE — Progress Notes (Addendum)
[facility] Outpatient Follow up visit  Patient Identification: Heather Marquez MRN:  [id number] Date of Evaluation:  03/15/2020 Referral Source: [facility] mental health Chief Complaint:    Fu Bipolar Visit Diagnosis:    ICD-10-CM   1. Bipolar I disorder (HCC)  F31.9      I connected with [name] on 03/15/20 at  4:00 PM EDT by telephone and verified that I am speaking with the correct person using two identifiers.  I discussed the limitations, risks, security and privacy concerns of performing an evaluation and management service by telephone and the availability of in person appointments. I also discussed with the patient that there may be a patient responsible charge related to this service. The patient expressed understanding and agreed to proceed. Patient location home Provider location home History of Present Illness:  59  years old currently married African-American female initially referred by [facility] committee mental healthcare after her relocation here.  Last visit was after hospital discharge she was resisting taking seroquel and said will take some and continued depakote   Last visit recommended she continues atleast small dose of seroquel and she agreed to 50mg  but now says she is not taking and doing fair, sleep fair, understands the risk but not willing to take seroquel    She is taking depakote 1000mg  at night, levels done 52. Denies feeling hopeless or paranoid.  Denies side effects Says she feels fine,  Husband was not home so we discussed if he can connect with and acknoledge patient not taking seroquel and there is some risk.  Otherwise was alert and cooperative not agitated Husband described  As  supportive   Aggravating factor: non compliant history  Modifying factor: husband Past Psychiatric History: bipolar, manic as per admission history  Previous Psychotropic Medications: Yes     Family History: History reviewed. No pertinent family  history.  Social History:   Social History   Socioeconomic History  . Marital status: Married    Spouse name: Not on file  . Number of children: Not on file  . Years of education: Not on file  . Highest education level: Not on file  Occupational History  . Not on file  Tobacco Use  . Smoking status: Never Smoker  . Smokeless tobacco: Never Used  Vaping Use  . Vaping Use: Never used  Substance and Sexual Activity  . Alcohol use: No  . Drug use: No  . Sexual activity: Yes    Partners: Male  Other Topics Concern  . Not on file  Social History Narrative  . Not on file   Social Determinants of Health   Financial Resource Strain:   . Difficulty of Paying Living Expenses:   Food Insecurity:   . Worried About in the Last Year:   . [place] in the Last Year:   Transportation Needs:   . Programme researcher, broadcasting/film/video (Medical):   Barista Lack of Transportation (Non-Medical):   Physical Activity:   . Days of Exercise per Week:   . Minutes of Exercise per Session:   Stress:   . Feeling of Stress :   Social Connections:   . Frequency of Communication with Friends and Family:   . Frequency of Social Gatherings with Friends and Family:   . Attends Religious Services:   . Active Member of Clubs or Organizations:   . Attends Freight forwarder Meetings:   Marland Kitchen Marital Status:       Allergies:  Allergies  Allergen Reactions  . Mango Flavor Rash    Metabolic Disorder Labs: Lab Results  Component Value Date   HGBA1C 5.6 10/01/2019   MPG 114.02 10/01/2019   No results found for: PROLACTIN Lab Results  Component Value Date   CHOL 160 10/01/2019   TRIG 46 10/01/2019   HDL 71 10/01/2019   CHOLHDL 2.3 10/01/2019   VLDL 9 10/01/2019   LDLCALC 80 10/01/2019     Current Medications: Current Outpatient Medications  Medication Sig Dispense Refill  . amLODipine (NORVASC) 10 MG tablet Take 1 tablet (10 mg total) by mouth daily. 30 tablet 1  . divalproex  (DEPAKOTE) 500 MG DR tablet Take 2 tablets (1,000 mg total) by mouth at bedtime. 60 tablet 1  . QUEtiapine (SEROQUEL) 50 MG tablet Take 1 tablet (50 mg total) by mouth at bedtime. 30 tablet 1  . rosuvastatin (CRESTOR) 5 MG tablet Take 1 tablet (5 mg total) by mouth daily. 30 tablet 1   No current facility-administered medications for this visit.      Psychiatric Specialty Exam: Review of Systems  Cardiovascular: Negative for chest pain.  Psychiatric/Behavioral: Negative for depression.    There were no vitals taken for this visit.There is no height or weight on file to calculate BMI.  General Appearance:   Eye Contact:    Speech:  Low   Volume:  Decreased  Mood: fair  Affect:   Thought Process:  Goal Directed  Orientation:  Full (Time, Place, and Person)  Thought Content:  Rumination  Suicidal Thoughts:  No  Homicidal Thoughts:  No  Memory:  Immediate;   Fair Recent;   Fair  Judgement:  Fair  Insight:  Fair  Psychomotor Activity:    Concentration:  Concentration: Fair and Attention Span: Fair  Recall:  [employer] of Knowledge:Fair  Language: Fair  Akathisia:    Handed:  Right  AIMS (if indicated): no involuntary movements  Assets:  Desire for Improvement Social Support  ADL's:  Intact  Cognition: WNL  Sleep:  fair    Treatment Plan Summary: Medication management and Plan as follows   1. Bipolar per history: does not want to be on seroquel or any other.  Doing fair, husband needs to call let us know of her status, continue depakote Levels checked 52  Says depakote helps sleep and mood.  2. Anxiety; denies if worse, continue work on distractions 3. Sleep: fair with depakote as mood stabilizer Mostly discussed compliance and to call back for any concerns will fu again within few weeks.  She is not interested for any higher level of care and states will take her meds.   time spent not face to face Fu 4-6w has meds for now  I discussed the assessment and  treatment plan with the patient. The patient was provided an opportunity to ask questions and all were answered. The patient agreed with the plan and demonstrated an understanding of the instructions.   The patient was advised to call back or seek an in-person evaluation if the symptoms worsen or if the condition fails to improve as anticipated. Non face to face time spent Fu 75m.  Addendum: have communicated and called husband Chanetta Marshall, who acknowledges patient is doing well without seroquel, sleeping well and that he is aware of her medications.

## 2020-03-16 ENCOUNTER — Telehealth (HOSPITAL_COMMUNITY): Payer: Self-pay

## 2020-03-16 NOTE — Telephone Encounter (Signed)
He needs you to call him, to speak about medications. Please call [phone] [facility]

## 2020-03-16 NOTE — Telephone Encounter (Signed)
Connected and advised to be available during the appointment time next time. Will add note to addendum

## 2020-03-16 NOTE — Telephone Encounter (Signed)
Patient husband called asking if you could call him when you have time to let him know how patient is doing and if there were any medication changes. He wants to speak with you about the appt she had yesterday.   Heather Marquez [phone]

## 2020-03-16 NOTE — Telephone Encounter (Signed)
He can leave a message how patient is doing.. I wanted him to call and let us know

## 2020-04-05 ENCOUNTER — Telehealth (HOSPITAL_COMMUNITY): Payer: Self-pay

## 2020-04-05 NOTE — Telephone Encounter (Signed)
Patient's husband called and stated that he would like doctor to call patient and talk to her regarding her medication - Seroquel in particular. Please call Heather Marquez at [phone]. Thank you.

## 2020-04-05 NOTE — Telephone Encounter (Signed)
Please call between 12:00 and 3:00pm

## 2020-04-06 NOTE — Telephone Encounter (Signed)
I just spoke with with the patient with her husband listening. She was quite upset that "someone" (husband ) called [place] and when I recommended she increase dose of Seroquel to 100 mg she said she does not need a higher dose, she sleeps well and is "doing well". She sounded dysphoric but apologized for "being upset" and eventually hung up on me. Before that she said she is going to set up early appointment with Dr. Gilmore Laroche though.

## 2020-04-06 NOTE — Telephone Encounter (Signed)
Patient husband called back this morning asking to speak to a provider that is covering for Dr. Gilmore Laroche. He is stating patient did not get sleep last night, she is snapping at everyone and her personality is changing. He wants to know what to do.  Husband is Ruthann Angulo CB# [phone]

## 2020-04-06 NOTE — Telephone Encounter (Signed)
Patients' brother called and left a vm Tuesday evening stating that patient is missing. We can not speak with him without consent.

## 2020-04-28 ENCOUNTER — Encounter (HOSPITAL_COMMUNITY): Payer: Self-pay | Admitting: Psychiatry

## 2020-04-28 ENCOUNTER — Telehealth (INDEPENDENT_AMBULATORY_CARE_PROVIDER_SITE_OTHER): Payer: Medicare HMO | Admitting: Psychiatry

## 2020-04-28 DIAGNOSIS — F319 Bipolar disorder, unspecified: Secondary | ICD-10-CM

## 2020-04-28 MED ORDER — PALIPERIDONE PALMITATE ER 117 MG/0.75ML IM SUSY: 156.0000 mg | PREFILLED_SYRINGE | INTRAMUSCULAR | 1 refills | Status: DC

## 2020-04-28 MED ORDER — DIVALPROEX SODIUM 500 MG PO DR TAB
500.0000 mg | DELAYED_RELEASE_TABLET | Freq: Two times a day (BID) | ORAL | 0 refills | Status: DC
Start: 2020-04-28 — End: 2020-05-17

## 2020-04-28 NOTE — Progress Notes (Signed)
[facility] Outpatient Follow up visit  Patient Identification: Heather Marquez MRN:  [id number] Date of Evaluation:  04/28/2020 Referral Source: [facility] mental health Chief Complaint:    Fu Bipolar Visit Diagnosis:    ICD-10-CM   1. Bipolar I disorder (HCC)  F31.9     I connected with [name] on 04/28/20 at  3:15 PM EDT by telephone and verified that I am speaking with the correct person using two identifiers.    I discussed the limitations, risks, security and privacy concerns of performing an evaluation and management service by telephone and the availability of in person appointments. I also discussed with the patient that there may be a patient responsible charge related to this service. The patient expressed understanding and agreed to proceed. Patient location home Provider location home office History of Present Illness:  59  years old currently married African-American female initially referred by [facility] committee mental healthcare after her relocation here.  She has been a hospital discharge before, now re admitted in [facility]ust [facility] as per notes, was manic , hyperverbal and paranoid,  Patient states she was fine but Chanetta Marshall wanted her to get admitted In hospital she got injection sustenna and next one is due 05/20/20. seroquel stopped , depakote changed to bid She does not elaborate on her admission. Notes were reviewed and diagnosed with bipolar mania    She was not taking seroquel prior to that and has been non compliant as per history Husband was not there today to discuss in past we have asked her husband to give [place] a call to acknowledge is she is doing fair.   Denies feeling hopeless or paranoid. She kept engaged with a slow monotonous tone also attended her bell and mailservice while on phone  Denies side effects   Otherwise was alert and cooperative not agitated   Aggravating factor: non compliant history Modifying factor: husband  Past Psychiatric  History: bipolar, manic as per admission history  Previous Psychotropic Medications: Yes     Family History: History reviewed. No pertinent family history.  Social History:   Social History   Socioeconomic History  . Marital status: Married    Spouse name: Not on file  . Number of children: Not on file  . Years of education: Not on file  . Highest education level: Not on file  Occupational History  . Not on file  Tobacco Use  . Smoking status: Never Smoker  . Smokeless tobacco: Never Used  Vaping Use  . Vaping Use: Never used  Substance and Sexual Activity  . Alcohol use: No  . Drug use: No  . Sexual activity: Yes    Partners: Male  Other Topics Concern  . Not on file  Social History Narrative  . Not on file   Social Determinants of Health   Financial Resource Strain:   . Difficulty of Paying Living Expenses: Not on file  Food Insecurity:   . Worried About Programme researcher, broadcasting/film/video in the Last Year: Not on file  . Ran Out of Food in the Last Year: Not on file  Transportation Needs:   . Lack of Transportation (Medical): Not on file  . Lack of Transportation (Non-Medical): Not on file  Physical Activity:   . Days of Exercise per Week: Not on file  . Minutes of Exercise per Session: Not on file  Stress:   . Feeling of Stress : Not on file  Social Connections:   . Frequency of Communication with Friends and Family:  Not on file  . Frequency of Social Gatherings with Friends and Family: Not on file  . Attends Religious Services: Not on file  . Active Member of Clubs or Organizations: Not on file  . Attends Banker Meetings: Not on file  . Marital Status: Not on file      Allergies:   Allergies  Allergen Reactions  . Mango Flavor Rash    Metabolic Disorder Labs: Lab Results  Component Value Date   HGBA1C 5.6 10/01/2019   MPG 114.02 10/01/2019   No results found for: PROLACTIN Lab Results  Component Value Date   CHOL 160 10/01/2019   TRIG 46  10/01/2019   HDL 71 10/01/2019   CHOLHDL 2.3 10/01/2019   VLDL 9 10/01/2019   LDLCALC 80 10/01/2019     Current Medications: Current Outpatient Medications  Medication Sig Dispense Refill  . amLODipine (NORVASC) 10 MG tablet Take 1 tablet (10 mg total) by mouth daily. 30 tablet 1  . divalproex (DEPAKOTE) 500 MG DR tablet Take 1 tablet (500 mg total) by mouth 2 (two) times daily. Refill when due, she may have meds for now 60 tablet 0  . [START ON 05/19/2020] Paliperidone ER (INVEGA SUSTENNA) injection Inject 156 mg into the muscle every 30 (thirty) days. 0.9 mL 1  . rosuvastatin (CRESTOR) 5 MG tablet Take 1 tablet (5 mg total) by mouth daily. 30 tablet 1   No current facility-administered medications for this visit.      Psychiatric Specialty Exam: Review of Systems  Cardiovascular: Negative for chest pain.  Psychiatric/Behavioral: Negative for depression and substance abuse.    There were no vitals taken for this visit.There is no height or weight on file to calculate BMI.  General Appearance:   Eye Contact:    Speech:  Low   Volume:  Decreased  Mood: fair  Affect:   Thought Process:  Goal Directed  Orientation:  Full (Time, Place, and Person)  Thought Content:  Rumination  Suicidal Thoughts:  No  Homicidal Thoughts:  No  Memory:  Immediate;   Fair Recent;   Fair  Judgement:  Fair  Insight:  Fair  Psychomotor Activity:    Concentration:  Concentration: Fair and Attention Span: Fair  Recall:  [employer] of Knowledge:Fair  Language: Fair  Akathisia:    Handed:  Right  AIMS (if indicated): no involuntary movements  Assets:  Desire for Improvement Social Support  ADL's:  Intact  Cognition: WNL  Sleep:  fair    Treatment Plan Summary: Medication management and Plan as follows   1. Bipolar per history: manic as per admission. Now on injection sustenna 156mg  due 9/10. Have informed her to come to office and refill sent to pharmacy at [facility] to bring the injection  and understand how to store it   Says depakote helps sleep and she will continue Not agitated, somewhat hyperverbal but not confrontive 2. Anxiety; does not endorse more then average 3. Sleep: fair , did not endorse concerns today  Mostly discussed compliance and to call back for any concerns will fu again within few weeks.  She is not interested for any higher level of care and states will take her meds but considering her repeated admissions she should follow up with ACTT program will inform staff   time spent not face to face 25 min Fu 3-4 weeks Fu for injection September 10.  I discussed the assessment and treatment plan with the patient. The patient was provided an opportunity  to ask questions and all were answered. The patient agreed with the plan and demonstrated an understanding of the instructions.   The patient was advised to call back or seek an in-person evaluation if the symptoms worsen or if the condition fails to improve as anticipated.  Thresa Ross MD

## 2020-05-17 ENCOUNTER — Encounter (HOSPITAL_COMMUNITY): Payer: Self-pay | Admitting: Psychiatry

## 2020-05-17 ENCOUNTER — Telehealth (INDEPENDENT_AMBULATORY_CARE_PROVIDER_SITE_OTHER): Payer: Medicare HMO | Admitting: Psychiatry

## 2020-05-17 DIAGNOSIS — F319 Bipolar disorder, unspecified: Secondary | ICD-10-CM

## 2020-05-17 MED ORDER — DIVALPROEX SODIUM 500 MG PO DR TAB
500.0000 mg | DELAYED_RELEASE_TABLET | Freq: Two times a day (BID) | ORAL | 0 refills | Status: DC
Start: 2020-05-17 — End: 2020-06-16

## 2020-05-17 MED ORDER — PALIPERIDONE PALMITATE ER 117 MG/0.75ML IM SUSY
156.0000 mg | PREFILLED_SYRINGE | INTRAMUSCULAR | 1 refills | Status: DC
Start: 2020-05-19 — End: 2020-06-20

## 2020-05-17 NOTE — Progress Notes (Signed)
[facility] Outpatient Follow up visit  Patient Identification: Heather Marquez MRN:  [id number] Date of Evaluation:  05/17/2020 Referral Source: [facility] mental health Chief Complaint:    Fu Bipolar Visit Diagnosis:    ICD-10-CM   1. Bipolar I disorder (HCC)  F31.9      I connected with [name] on 05/17/20 at  4:00 PM EDT by telephone and verified that I am speaking with the correct person using two identifiers.    I discussed the limitations, risks, security and privacy concerns of performing an evaluation and management service by telephone and the availability of in person appointments. I also discussed with the patient that there may be a patient responsible charge related to this service. The patient expressed understanding and agreed to proceed. Patient location home Provider location home office  History of Present Illness:  59  years old currently married African-American female initially referred by [facility] committee mental healthcare after her relocation here.  Doing better since last hospital discharge now on injeciton sustenna  Next due is 9/10 appointment has been made with my office nurse  States she has changed insurance and wants it at [employer], will send that  Also talked to husband Chanetta Marshall who endorses doing better and managing mood balance on meds Continues to take depakote  Not manic, more clear   Denies feeling hopeless or paranoid. She kept engaged with a slow monotonous tone Denies side effects   Otherwise was alert and cooperative not agitated   Aggravating factor: non compliant history Modifying factor: husband  Past Psychiatric History: bipolar, manic as per admission history  Previous Psychotropic Medications: Yes     Family History: History reviewed. No pertinent family history.  Social History:   Social History   Socioeconomic History  . Marital status: Married    Spouse name: Not on file  . Number of children: Not on file  .  Years of education: Not on file  . Highest education level: Not on file  Occupational History  . Not on file  Tobacco Use  . Smoking status: Never Smoker  . Smokeless tobacco: Never Used  Vaping Use  . Vaping Use: Never used  Substance and Sexual Activity  . Alcohol use: No  . Drug use: No  . Sexual activity: Yes    Partners: Male  Other Topics Concern  . Not on file  Social History Narrative  . Not on file   Social Determinants of Health   Financial Resource Strain:   . Difficulty of Paying Living Expenses: Not on file  Food Insecurity:   . Worried About Programme researcher, broadcasting/film/video in the Last Year: Not on file  . Ran Out of Food in the Last Year: Not on file  Transportation Needs:   . Lack of Transportation (Medical): Not on file  . Lack of Transportation (Non-Medical): Not on file  Physical Activity:   . Days of Exercise per Week: Not on file  . Minutes of Exercise per Session: Not on file  Stress:   . Feeling of Stress : Not on file  Social Connections:   . Frequency of Communication with Friends and Family: Not on file  . Frequency of Social Gatherings with Friends and Family: Not on file  . Attends Religious Services: Not on file  . Active Member of Clubs or Organizations: Not on file  . Attends Banker Meetings: Not on file  . Marital Status: Not on file      Allergies:   Allergies  Allergen Reactions  . Mango Flavor Rash    Metabolic Disorder Labs: Lab Results  Component Value Date   HGBA1C 5.6 10/01/2019   MPG 114.02 10/01/2019   No results found for: PROLACTIN Lab Results  Component Value Date   CHOL 160 10/01/2019   TRIG 46 10/01/2019   HDL 71 10/01/2019   CHOLHDL 2.3 10/01/2019   VLDL 9 10/01/2019   LDLCALC 80 10/01/2019     Current Medications: Current Outpatient Medications  Medication Sig Dispense Refill  . amLODipine (NORVASC) 10 MG tablet Take 1 tablet (10 mg total) by mouth daily. 30 tablet 1  . divalproex (DEPAKOTE) 500  MG DR tablet Take 1 tablet (500 mg total) by mouth 2 (two) times daily. Refill when due, she may have meds for now 60 tablet 0  . [START ON 05/19/2020] Paliperidone ER (INVEGA SUSTENNA) injection Inject 156 mg into the muscle every 30 (thirty) days. 0.9 mL 1  . rosuvastatin (CRESTOR) 5 MG tablet Take 1 tablet (5 mg total) by mouth daily. 30 tablet 1   No current facility-administered medications for this visit.      Psychiatric Specialty Exam: Review of Systems  Cardiovascular: Negative for chest pain.  Psychiatric/Behavioral: Negative for depression and substance abuse.    There were no vitals taken for this visit.There is no height or weight on file to calculate BMI.  General Appearance:   Eye Contact:    Speech:  Low   Volume:  Decreased  Mood: fair  Affect:   Thought Process:  Goal Directed  Orientation:  Full (Time, Place, and Person)  Thought Content:  Rumination  Suicidal Thoughts:  No  Homicidal Thoughts:  No  Memory:  Immediate;   Fair Recent;   Fair  Judgement:  Fair  Insight:  Fair  Psychomotor Activity:    Concentration:  Concentration: Fair and Attention Span: Fair  Recall:  [employer] of Knowledge:Fair  Language: Fair  Akathisia:    Handed:  Right  AIMS (if indicated): no involuntary movements  Assets:  Desire for Improvement Social Support  ADL's:  Intact  Cognition: WNL  Sleep:  fair    Treatment Plan Summary: Medication management and Plan as follows   1. Bipolar per history: manic ; doing fair, balanced as per husband as well. Continue depakote,  inj sustenna [facility] as [facility], has appointment on 9/10 with nurse, to keep and discussed compliance  Says depakote helps sleep and she will continue Not agitated or confrontive 2. Anxiety; does not endorse more then average 3. Sleep:fair did not endorse concerns today  Mostly discussed compliance and to call back for any concerns will fu again within few weeks.  She is not  interested for any higher level of care and states will take her meds but considering her repeated admissions she should follow up with ACTT program, have informed staff last visit  time spent not face to face 25 min Fu 3-4 weeks Fu for injection September 10.  I discussed the assessment and treatment plan with the patient. The patient was provided an opportunity to ask questions and all were answered. The patient agreed with the plan and demonstrated an understanding of the instructions.   The patient was advised to call back or seek an in-person evaluation if the symptoms worsen or if the condition fails to improve as anticipated.  Thresa Ross MD

## 2020-05-20 ENCOUNTER — Ambulatory Visit (INDEPENDENT_AMBULATORY_CARE_PROVIDER_SITE_OTHER): Payer: Medicare HMO

## 2020-05-20 ENCOUNTER — Other Ambulatory Visit: Payer: Self-pay

## 2020-05-20 ENCOUNTER — Encounter (HOSPITAL_COMMUNITY): Payer: Self-pay

## 2020-05-20 VITALS — BP 121/78 | Temp 97.4°F | Ht 64.0 in | Wt 183.0 lb

## 2020-05-20 DIAGNOSIS — F312 Bipolar disorder, current episode manic severe with psychotic features: Secondary | ICD-10-CM | POA: Diagnosis not present

## 2020-05-20 DIAGNOSIS — F310 Bipolar disorder, current episode hypomanic: Secondary | ICD-10-CM | POA: Diagnosis not present

## 2020-05-20 MED ORDER — PALIPERIDONE PALMITATE ER 156 MG/ML IM SUSY
156.0000 mg | PREFILLED_SYRINGE | Freq: Once | INTRAMUSCULAR | Status: AC
Start: 2020-05-20 — End: 2020-05-20
  Administered 2020-05-20: 156 mg via INTRAMUSCULAR

## 2020-05-20 NOTE — Progress Notes (Signed)
Patient came into office to receive Invega Sustenna 156mg /ml. Patient appeared stable but with a little shakiness. Patient stated she felt good. She was prepared for her injection and wanted it to be in the right deltoid. Medication was administered to the patient in right deltoid. Patient stated no problems after injection and agreed to come back in 4 weeks for the next one. No suicidal ideation or mania present.

## 2020-06-16 ENCOUNTER — Other Ambulatory Visit (HOSPITAL_COMMUNITY): Payer: Self-pay | Admitting: Psychiatry

## 2020-06-17 ENCOUNTER — Ambulatory Visit (INDEPENDENT_AMBULATORY_CARE_PROVIDER_SITE_OTHER): Payer: Medicare HMO

## 2020-06-17 ENCOUNTER — Encounter (HOSPITAL_COMMUNITY): Payer: Self-pay

## 2020-06-17 VITALS — BP 122/100 | Ht 64.0 in | Wt 180.0 lb

## 2020-06-17 DIAGNOSIS — F312 Bipolar disorder, current episode manic severe with psychotic features: Secondary | ICD-10-CM | POA: Diagnosis not present

## 2020-06-17 MED ORDER — PALIPERIDONE PALMITATE ER 156 MG/ML IM SUSY
156.0000 mg | PREFILLED_SYRINGE | Freq: Once | INTRAMUSCULAR | Status: AC
  Administered 2020-06-17: 156 mg via INTRAMUSCULAR

## 2020-06-17 NOTE — Progress Notes (Signed)
Patient came into office to receive Invega Sustenna 156mg /ml. Patient appeared stable but blood pressure was little high. She stated that she had not taken daily blood pressure medication yet. Patient stated she felt good. She was prepared for her injection and wanted it to be in the right deltoid. Medication was administered to the patient in right deltoid. Patient stated no problems after injection and agreed to come back in 4 weeks for the next one. No suicidal ideation or mania present.

## 2020-06-20 ENCOUNTER — Telehealth (INDEPENDENT_AMBULATORY_CARE_PROVIDER_SITE_OTHER): Payer: Medicare HMO | Admitting: Psychiatry

## 2020-06-20 ENCOUNTER — Encounter (HOSPITAL_COMMUNITY): Payer: Self-pay | Admitting: Psychiatry

## 2020-06-20 DIAGNOSIS — F312 Bipolar disorder, current episode manic severe with psychotic features: Secondary | ICD-10-CM | POA: Diagnosis not present

## 2020-06-20 DIAGNOSIS — F310 Bipolar disorder, current episode hypomanic: Secondary | ICD-10-CM

## 2020-06-20 MED ORDER — PALIPERIDONE PALMITATE ER 117 MG/0.75ML IM SUSY
156.0000 mg | PREFILLED_SYRINGE | INTRAMUSCULAR | 1 refills | Status: DC
Start: 2020-06-20 — End: 2021-10-30

## 2020-06-20 MED ORDER — DIVALPROEX SODIUM 500 MG PO DR TAB
500.0000 mg | DELAYED_RELEASE_TABLET | Freq: Two times a day (BID) | ORAL | 0 refills | Status: DC
Start: 2020-06-20 — End: 2020-07-27

## 2020-06-20 NOTE — Progress Notes (Signed)
[facility] Outpatient Follow up visit  Patient Identification: Heather Marquez MRN:  [id number] Date of Evaluation:  06/20/2020 Referral Source: [facility] mental health Chief Complaint:    Fu Bipolar Visit Diagnosis:    ICD-10-CM   1. 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 Bipolar disorder, current episode hypomanic (HCC)  F31.0      I connected with [name] on 06/20/20 at 3: 15 PM EDT by telephone and verified that I am speaking with the correct person using two identifiers.    I discussed the limitations, risks, security and privacy concerns of performing an evaluation and management service by telephone and the availability of in person appointments. I also discussed with the patient that there may be a patient responsible charge related to this service. The patient expressed understanding and agreed to proceed. Patient location home Provider location home office  History of Present Illness:  59  years old currently married African-American female initially referred by [facility] committee mental healthcare after her relocation here.  Doing better since last hospital discharge now on injeciton sustenna  Got injection 10/8  Less paranoid or scattered, talked to [place] husband states she is doing fair   Continues to take depakote, last level checked 58  Not manic, more clear Not edgy  And kept engaged Denies side effects   Otherwise was alert and cooperative not agitated   Aggravating factor: non compliant history Modifying factor: husband  Past Psychiatric History: bipolar, manic as per admission history  Previous Psychotropic Medications: Yes     Family History: History reviewed. No pertinent family history.  Social History:   Social History   Socioeconomic History  . Marital status: Married    Spouse name: Not on file  . Number of children: Not on file  . Years of education: Not on file  . Highest education level: Not on  file  Occupational History  . Not on file  Tobacco Use  . Smoking status: Never Smoker  . Smokeless tobacco: Never Used  Vaping Use  . Vaping Use: Never used  Substance and Sexual Activity  . Alcohol use: No  . Drug use: No  . Sexual activity: Yes    Partners: Male  Other Topics Concern  . Not on file  Social History Narrative  . Not on file   Social Determinants of Health   Financial Resource Strain:   . Difficulty of Paying Living Expenses: Not on file  Food Insecurity:   . Worried About Programme researcher, broadcasting/film/video in the Last Year: Not on file  . Ran Out of Food in the Last Year: Not on file  Transportation Needs:   . Lack of Transportation (Medical): Not on file  . Lack of Transportation (Non-Medical): Not on file  Physical Activity:   . Days of Exercise per Week: Not on file  . Minutes of Exercise per Session: Not on file  Stress:   . Feeling of Stress : Not on file  Social Connections:   . Frequency of Communication with Friends and Family: Not on file  . Frequency of Social Gatherings with Friends and Family: Not on file  . Attends Religious Services: Not on file  . Active Member of Clubs or Organizations: Not on file  . Attends Banker Meetings: Not on file  . Marital Status: Not on file      Allergies:   Allergies  Allergen Reactions  . Mango Flavor Rash    Metabolic  Disorder Labs: Lab Results  Component Value Date   HGBA1C 5.6 10/01/2019   MPG 114.02 10/01/2019   No results found for: PROLACTIN Lab Results  Component Value Date   CHOL 160 10/01/2019   TRIG 46 10/01/2019   HDL 71 10/01/2019   CHOLHDL 2.3 10/01/2019   VLDL 9 10/01/2019   LDLCALC 80 10/01/2019     Current Medications: Current Outpatient Medications  Medication Sig Dispense Refill  . amLODipine (NORVASC) 10 MG tablet Take 1 tablet (10 mg total) by mouth daily. 30 tablet 1  . divalproex (DEPAKOTE) 500 MG DR tablet Take 1 tablet (500 mg total) by mouth 2 (two) times  daily. 60 tablet 0  . Paliperidone ER (INVEGA SUSTENNA) injection Inject 156 mg into the muscle every 30 (thirty) days. 0.9 mL 1  . rosuvastatin (CRESTOR) 5 MG tablet Take 1 tablet (5 mg total) by mouth daily. 30 tablet 1   No current facility-administered medications for this visit.      Psychiatric Specialty Exam: Review of Systems  Cardiovascular: Negative for chest pain.  Psychiatric/Behavioral: Negative for depression and substance abuse.    There were no vitals taken for this visit.There is no height or weight on file to calculate BMI.  General Appearance:   Eye Contact:    Speech:  Low   Volume:  Decreased  Mood: fair  Affect:   Thought Process:  Goal Directed  Orientation:  Full (Time, Place, and Person)  Thought Content:  Rumination  Suicidal Thoughts:  No  Homicidal Thoughts:  No  Memory:  Immediate;   Fair Recent;   Fair  Judgement:  Fair  Insight:  Fair  Psychomotor Activity:    Concentration:  Concentration: Fair and Attention Span: Fair  Recall:  [employer] of Knowledge:Fair  Language: Fair  Akathisia:    Handed:  Right  AIMS (if indicated): no involuntary movements  Assets:  Desire for Improvement Social Support  ADL's:  Intact  Cognition: WNL  Sleep:  fair    Treatment Plan Summary: Medication management and Plan as follows   1. Bipolar per history: manic ; doing fair, continue  inj sustenna refill sent to [facility] and have got 2 last 2 months injection, tolerating it well, continue  Says depakote helps sleep and she will continue Not agitated or confrontive 2. Anxiety;  Manageable.  3. Sleep:fair did not endorse concerns today  Mostly discussed compliance and to call back for any concerns will fu again within few weeks.  She is not interested for any higher level of care and states will take her meds but considering her repeated admissions she should follow up with [facility] program, have informed staff last visit. For now compliant with  injection  time spent not face to face 18 min FU 70m for med review and injection monthly  I discussed the assessment and treatment plan with the patient. The patient was provided an opportunity to ask questions and all were answered. The patient agreed with the plan and demonstrated an understanding of the instructions.   The patient was advised to call back or seek an in-person evaluation if the symptoms worsen or if the condition fails to improve as anticipated.  Thresa Ross MD

## 2020-07-14 ENCOUNTER — Other Ambulatory Visit (HOSPITAL_COMMUNITY): Payer: Self-pay | Admitting: Psychiatry

## 2020-07-15 ENCOUNTER — Ambulatory Visit (HOSPITAL_COMMUNITY): Payer: Medicare HMO | Admitting: Psychiatry

## 2020-07-18 ENCOUNTER — Ambulatory Visit (INDEPENDENT_AMBULATORY_CARE_PROVIDER_SITE_OTHER): Payer: Medicare HMO

## 2020-07-18 ENCOUNTER — Encounter (HOSPITAL_COMMUNITY): Payer: Self-pay

## 2020-07-18 VITALS — BP 150/100 | Ht 64.0 in | Wt 194.0 lb

## 2020-07-18 DIAGNOSIS — F312 Bipolar disorder, current episode manic severe with psychotic features: Secondary | ICD-10-CM | POA: Diagnosis not present

## 2020-07-18 MED ORDER — PALIPERIDONE PALMITATE ER 156 MG/ML IM SUSY
156.0000 mg | PREFILLED_SYRINGE | Freq: Once | INTRAMUSCULAR | Status: AC
  Administered 2020-07-18: 156 mg via INTRAMUSCULAR

## 2020-07-18 NOTE — Progress Notes (Signed)
Patient came into office to receive Invega Sustenna 156mg /ml. Patient appeared stable but blood pressure was little high. She stated that she had just taken her blood pressure medication. Patient stated she felt good. She explained that the injection site gets really sore.  She was prepared for her injection and wanted it to be in the right deltoid. Medication was administered to the patient in right deltoid. Patient stated no problems after injection and agreed to come back in 4 weeks for the next one. No suicidal ideation or mania present.

## 2020-07-27 ENCOUNTER — Other Ambulatory Visit (HOSPITAL_COMMUNITY): Payer: Self-pay | Admitting: Psychiatry

## 2020-08-12 ENCOUNTER — Other Ambulatory Visit (HOSPITAL_COMMUNITY): Payer: Self-pay | Admitting: Psychiatry

## 2020-08-15 ENCOUNTER — Ambulatory Visit (HOSPITAL_COMMUNITY): Payer: Medicare HMO | Admitting: Psychiatry

## 2020-08-17 ENCOUNTER — Ambulatory Visit (INDEPENDENT_AMBULATORY_CARE_PROVIDER_SITE_OTHER): Payer: Medicare HMO

## 2020-08-17 ENCOUNTER — Ambulatory Visit (HOSPITAL_COMMUNITY): Payer: Medicare HMO | Admitting: Psychiatry

## 2020-08-17 ENCOUNTER — Other Ambulatory Visit: Payer: Self-pay

## 2020-08-17 ENCOUNTER — Encounter (HOSPITAL_COMMUNITY): Payer: Self-pay

## 2020-08-17 VITALS — BP 154/92 | Temp 98.6°F | Ht 64.0 in | Wt 206.0 lb

## 2020-08-17 DIAGNOSIS — F312 Bipolar disorder, current episode manic severe with psychotic features: Secondary | ICD-10-CM

## 2020-08-17 MED ORDER — PALIPERIDONE PALMITATE ER 156 MG/ML IM SUSY
156.0000 mg | PREFILLED_SYRINGE | Freq: Once | INTRAMUSCULAR | Status: AC
Start: 2020-08-17 — End: 2020-08-17
  Administered 2020-08-17: 156 mg via INTRAMUSCULAR

## 2020-08-17 NOTE — Progress Notes (Signed)
Patient came into office to receive Invega Sustenna 156mg/ml. Patient appeared stable. Patient stated she felt good. She was prepared for her injection and wanted it to be in the right deltoid. Medication was administered to the patient in right deltoid. Patient stated no problems after injection and agreed to come back in 4 weeks for the nextscheduled injection. No suicidal ideation or mania present. 

## 2020-08-23 ENCOUNTER — Telehealth (INDEPENDENT_AMBULATORY_CARE_PROVIDER_SITE_OTHER): Payer: Medicare HMO | Admitting: Psychiatry

## 2020-08-23 ENCOUNTER — Encounter (HOSPITAL_COMMUNITY): Payer: Self-pay | Admitting: Psychiatry

## 2020-08-23 ENCOUNTER — Other Ambulatory Visit (HOSPITAL_COMMUNITY): Payer: Self-pay | Admitting: Psychiatry

## 2020-08-23 DIAGNOSIS — F312 Bipolar disorder, current episode manic severe with psychotic features: Secondary | ICD-10-CM | POA: Diagnosis not present

## 2020-08-23 DIAGNOSIS — F310 Bipolar disorder, current episode hypomanic: Secondary | ICD-10-CM | POA: Diagnosis not present

## 2020-08-23 MED ORDER — DIVALPROEX SODIUM 500 MG PO DR TAB
500.0000 mg | DELAYED_RELEASE_TABLET | Freq: Every day | ORAL | 0 refills | Status: DC
Start: 2020-08-23 — End: 2020-08-23

## 2020-08-23 NOTE — Progress Notes (Signed)
[facility] Outpatient Follow up visit  Patient Identification: Heather Marquez MRN:  [id number] Date of Evaluation:  08/23/2020 Referral Source: [facility] mental health Chief Complaint:    Fu Bipolar Visit Diagnosis:    ICD-10-CM   1. 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 Bipolar disorder, current episode hypomanic (HCC)  F31.0       I connected with [name] on 08/23/20 at  3:00 PM EST by telephone and verified that I am speaking with the correct person using two identifiers.     I discussed the limitations, risks, security and privacy concerns of performing an evaluation and management service by telephone and the availability of in person appointments. I also discussed with the patient that there may be a patient responsible charge related to this service. The patient expressed understanding and agreed to proceed. Patient location home Provider location home office  History of Present Illness:  59  years old currently married African-American female initially referred by [facility] committee mental healthcare after her relocation here.  Doing better since last hospital discharge now on injeciton sustenna  Getting injection depot Doing fair,  Some weight gain Not manic Less paranoid or scattered, talked to [place] husband states she is doing fair   Continues to take depakote, last level checked 47 Husband is supportive and she feels less edgy  Not manic, more clear Not edgy  And kept engaged Denies side effects   Otherwise was alert and cooperative not agitated   Aggravating factor: non compliant history Modifying factor: husband  Past Psychiatric History: bipolar, manic as per admission history  Previous Psychotropic Medications: Yes     Family History: History reviewed. No pertinent family history.  Social History:   Social History   Socioeconomic History  . Marital status: Married    Spouse name: Not on file  . Number  of children: Not on file  . Years of education: Not on file  . Highest education level: Not on file  Occupational History  . Not on file  Tobacco Use  . Smoking status: Never Smoker  . Smokeless tobacco: Never Used  Vaping Use  . Vaping Use: Never used  Substance and Sexual Activity  . Alcohol use: No  . Drug use: No  . Sexual activity: Yes    Partners: Male  Other Topics Concern  . Not on file  Social History Narrative  . Not on file   Social Determinants of Health   Financial Resource Strain: Not on file  Food Insecurity: Not on file  Transportation Needs: Not on file  Physical Activity: Not on file  Stress: Not on file  Social Connections: Not on file      Allergies:   Allergies  Allergen Reactions  . Mango Flavor Rash    Metabolic Disorder Labs: Lab Results  Component Value Date   HGBA1C 5.6 10/01/2019   MPG 114.02 10/01/2019   No results found for: PROLACTIN Lab Results  Component Value Date   CHOL 160 10/01/2019   TRIG 46 10/01/2019   HDL 71 10/01/2019   CHOLHDL 2.3 10/01/2019   VLDL 9 10/01/2019   LDLCALC 80 10/01/2019     Current Medications: Current Outpatient Medications  Medication Sig Dispense Refill  . amLODipine (NORVASC) 10 MG tablet Take 1 tablet (10 mg total) by mouth daily. 30 tablet 1  . divalproex (DEPAKOTE) 500 MG DR tablet Take 1 tablet (500 mg total) by mouth at bedtime. 30 tablet 0  .  INVEGA SUSTENNA 156 MG/ML SUSY injection ADMINISTER 1 ML IN THE MUSCLE EVERY 30 DAYS. APPT ON 05/20/20. 1 mL 0  . Paliperidone ER (INVEGA SUSTENNA) injection Inject 156 mg into the muscle every 30 (thirty) days. 0.9 mL 1  . rosuvastatin (CRESTOR) 5 MG tablet Take 1 tablet (5 mg total) by mouth daily. 30 tablet 1   No current facility-administered medications for this visit.      Psychiatric Specialty Exam: Review of Systems  Cardiovascular: Negative for chest pain.  Psychiatric/Behavioral: Negative for depression and substance abuse.     There were no vitals taken for this visit.There is no height or weight on file to calculate BMI.  General Appearance:   Eye Contact:    Speech:  Low   Volume:  Decreased  Mood: fair  Affect:   Thought Process:  Goal Directed  Orientation:  Full (Time, Place, and Person)  Thought Content:  Rumination  Suicidal Thoughts:  No  Homicidal Thoughts:  No  Memory:  Immediate;   Fair Recent;   Fair  Judgement:  Fair  Insight:  Fair  Psychomotor Activity:    Concentration:  Concentration: Fair and Attention Span: Fair  Recall:  [employer] of Knowledge:Fair  Language: Fair  Akathisia:    Handed:  Right  AIMS (if indicated): no involuntary movements  Assets:  Desire for Improvement Social Support  ADL's:  Intact  Cognition: WNL  Sleep:  fair    Treatment Plan Summary: Medication management and Plan as follows   1. Bipolar per history: manic ; doing better, continue injection Sustenna monthly, can lower depakote to 500mg  qhs due to weight concerns, discussed weight maintenance and activities  Not agitated or confrontive 2. Anxiety;  Manageable.  3. Sleep:fair did not endorse concerns today  Mostly discussed compliance and to call back for any concerns will fu again within few weeks.  Non face to face time spent 15 min FU 1 m for med review and injection monthly  call earlier for concerns  I discussed the assessment and treatment plan with the patient. The patient was provided an opportunity to ask questions and all were answered. The patient agreed with the plan and demonstrated an understanding of the instructions.   The patient was advised to call back or seek an in-person evaluation if the symptoms worsen or if the condition fails to improve as anticipated.  MD

## 2020-09-08 ENCOUNTER — Other Ambulatory Visit (HOSPITAL_COMMUNITY): Payer: Self-pay

## 2020-09-08 MED ORDER — INVEGA SUSTENNA 156 MG/ML IM SUSY: PREFILLED_SYRINGE | INTRAMUSCULAR | 0 refills | Status: DC

## 2020-09-13 ENCOUNTER — Encounter (HOSPITAL_COMMUNITY): Payer: Self-pay

## 2020-09-13 ENCOUNTER — Ambulatory Visit (INDEPENDENT_AMBULATORY_CARE_PROVIDER_SITE_OTHER): Payer: Medicare HMO

## 2020-09-13 ENCOUNTER — Other Ambulatory Visit: Payer: Self-pay

## 2020-09-13 VITALS — BP 158/98 | Temp 98.8°F | Ht 64.0 in | Wt 210.0 lb

## 2020-09-13 DIAGNOSIS — F312 Bipolar disorder, current episode manic severe with psychotic features: Secondary | ICD-10-CM

## 2020-09-13 MED ORDER — PALIPERIDONE PALMITATE ER 156 MG/ML IM SUSY
156.0000 mg | PREFILLED_SYRINGE | Freq: Once | INTRAMUSCULAR | Status: AC
Start: 2020-09-13 — End: 2020-09-13
  Administered 2020-09-13: 156 mg via INTRAMUSCULAR

## 2020-09-13 NOTE — Progress Notes (Signed)
Patient came into office to receive Invega Sustenna 156mg/ml. Patient appeared stable. Patient stated she felt good. She was prepared for her injection and wanted it to be in the right deltoid. Medication was administered to the patient in right deltoid. Patient stated no problems after injection and agreed to come back in 4 weeks for the nextscheduled injection. No suicidal ideation or mania present. 

## 2020-09-14 ENCOUNTER — Ambulatory Visit (HOSPITAL_COMMUNITY): Payer: Medicare HMO | Admitting: Psychiatry

## 2020-09-25 IMAGING — DX DG FOOT COMPLETE 3+V*L*
3 series · 3 of 3 positions shown · non-contrast
Comparison: None.

CLINICAL DATA: Left foot pain. Tenderness along fifth metatarsal
bone. No known injury.

EXAM:
LEFT FOOT - COMPLETE 3+ VIEW

[foot ap]
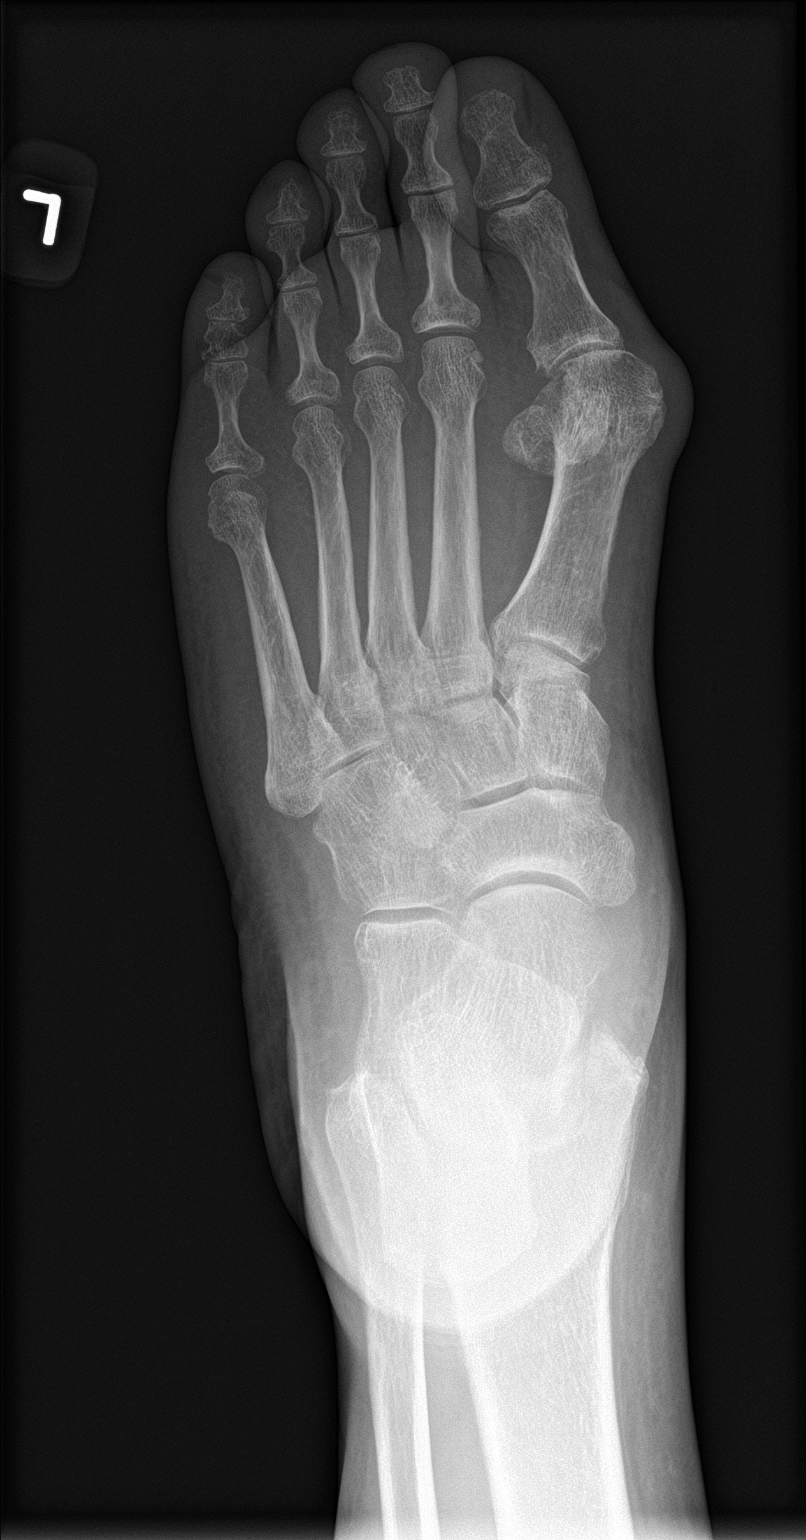

[foot obl]
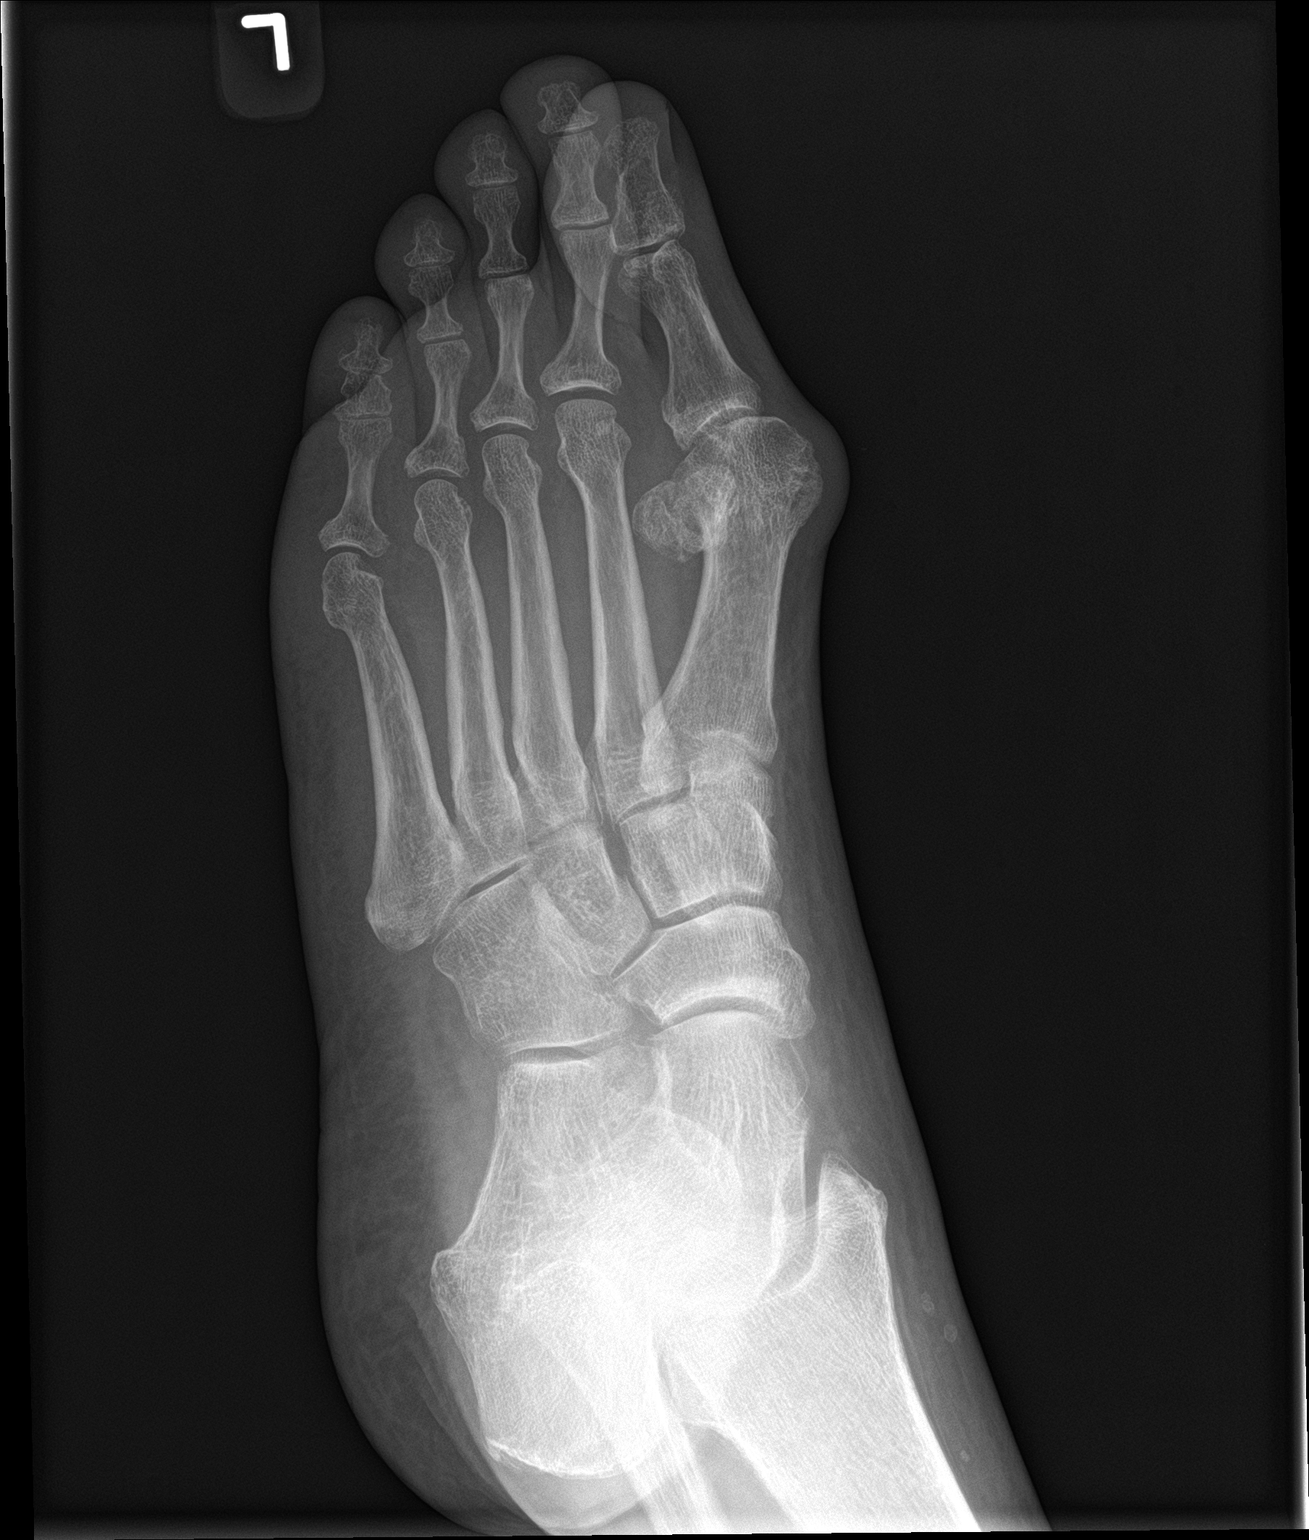

[foot lat]
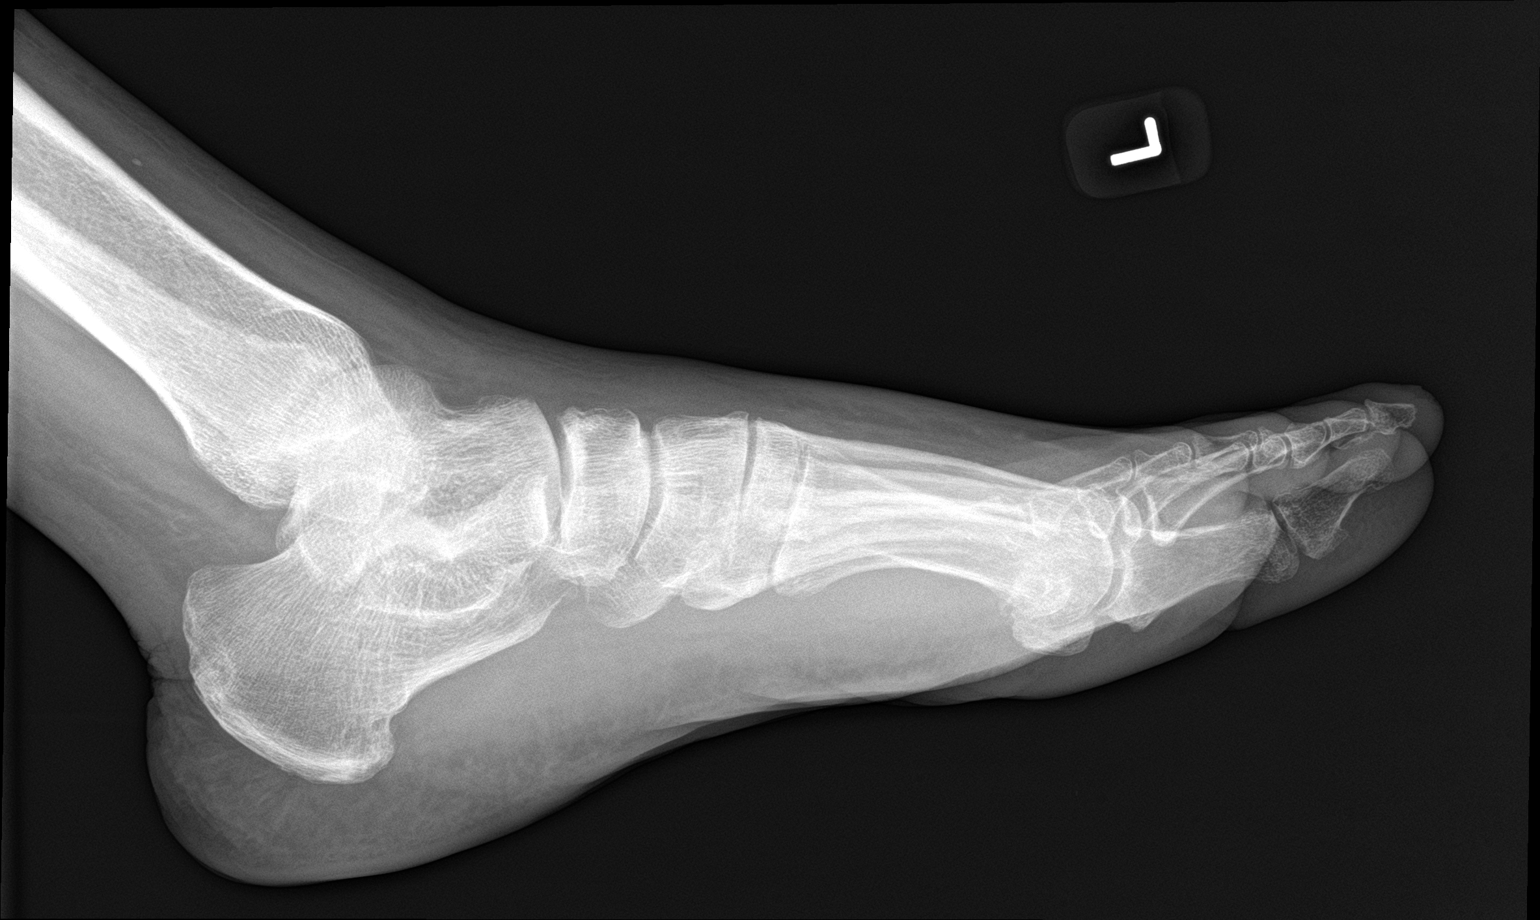

[3 of 3 positions shown; findings below may reference images not displayed]

FINDINGS: There is a marked hallux valgus deformity with secondary
degenerative changes at the first MTP joint. No fractures or
dislocations identified.
IMPRESSION: 1. No acute abnormality.
2. Marked hallux valgus with degenerative joint disease at the first
MTP joint.

## 2020-09-28 ENCOUNTER — Other Ambulatory Visit (HOSPITAL_COMMUNITY): Payer: Self-pay

## 2020-09-28 MED ORDER — DIVALPROEX SODIUM 500 MG PO DR TAB
DELAYED_RELEASE_TABLET | ORAL | 0 refills | Status: DC
Start: 2020-09-28 — End: 2020-09-29

## 2020-09-29 ENCOUNTER — Encounter (HOSPITAL_COMMUNITY): Payer: Self-pay | Admitting: Psychiatry

## 2020-09-29 ENCOUNTER — Telehealth (INDEPENDENT_AMBULATORY_CARE_PROVIDER_SITE_OTHER): Payer: Medicare HMO | Admitting: Psychiatry

## 2020-09-29 DIAGNOSIS — F310 Bipolar disorder, current episode hypomanic: Secondary | ICD-10-CM | POA: Diagnosis not present

## 2020-09-29 MED ORDER — DIVALPROEX SODIUM 500 MG PO DR TAB
DELAYED_RELEASE_TABLET | ORAL | 0 refills | Status: DC
Start: 2020-09-29 — End: 2020-11-30

## 2020-09-29 NOTE — Progress Notes (Signed)
[facility] Outpatient Follow up visit  Patient Identification: Heather Marquez MRN:  [id number] Date of Evaluation:  09/29/2020 Referral Source: [facility] mental health Chief Complaint:    Fu Bipolar Visit Diagnosis:    ICD-10-CM   1. Bipolar disorder, current episode hypomanic (HCC)  F31.0     Virtual Visit via Telephone Note  I connected with Heather Marquez on 09/29/20 at  1:00 PM EST by telephone and verified that I am speaking with the correct person using two identifiers.  Location: Patient: home Provider: home office   I discussed the limitations, risks, security and privacy concerns of performing an evaluation and management service by telephone and the availability of in person appointments. I also discussed with the patient that there may be a patient responsible charge related to this service. The patient expressed understanding and agreed to proceed.       I discussed the assessment and treatment plan with the patient. The patient was provided an opportunity to ask questions and all were answered. The patient agreed with the plan and demonstrated an understanding of the instructions.   The patient was advised to call back or seek an in-person evaluation if the symptoms worsen or if the condition fails to improve as anticipated.  I provided 12 minutes of non-face-to-face time during this encounter.   Thresa Ross, MD       History of Present Illness:  60  years old currently married African-American female initially referred by [facility] committee mental healthcare after her relocation here.  Continues to do better since on depot injection. Less paranoid and more clear and cooperative Balancing weight, says no gain last month Not manic  Continues to take depakote, last level checked 46 Husband is supportive and she feels less edgy    Otherwise was alert and cooperative not agitated   Aggravating factor: non compliant history Modifying factor: husband  Past  Psychiatric History: bipolar, manic as per admission history  Previous Psychotropic Medications: Yes     Family History: History reviewed. No pertinent family history.  Social History:   Social History   Socioeconomic History  . Marital status: Married    Spouse name: Not on file  . Number of children: Not on file  . Years of education: Not on file  . Highest education level: Not on file  Occupational History  . Not on file  Tobacco Use  . Smoking status: Never Smoker  . Smokeless tobacco: Never Used  Vaping Use  . Vaping Use: Never used  Substance and Sexual Activity  . Alcohol use: No  . Drug use: No  . Sexual activity: Yes    Partners: Male  Other Topics Concern  . Not on file  Social History Narrative  . Not on file   Social Determinants of Health   Financial Resource Strain: Not on file  Food Insecurity: Not on file  Transportation Needs: Not on file  Physical Activity: Not on file  Stress: Not on file  Social Connections: Not on file      Allergies:   Allergies  Allergen Reactions  . Mango Flavor Rash    Metabolic Disorder Labs: Lab Results  Component Value Date   HGBA1C 5.6 10/01/2019   MPG 114.02 10/01/2019   No results found for: PROLACTIN Lab Results  Component Value Date   CHOL 160 10/01/2019   TRIG 46 10/01/2019   HDL 71 10/01/2019   CHOLHDL 2.3 10/01/2019   VLDL 9 10/01/2019   LDLCALC 80 10/01/2019  Current Medications: Current Outpatient Medications  Medication Sig Dispense Refill  . amLODipine (NORVASC) 10 MG tablet Take 1 tablet (10 mg total) by mouth daily. 30 tablet 1  . divalproex (DEPAKOTE) 500 MG DR tablet TAKE 1 TABLET(500 MG) BY MOUTH AT BEDTIME 90 tablet 0  . paliperidone (INVEGA SUSTENNA) 156 MG/ML SUSY injection ADMINISTER 1 ML IN THE MUSCLE EVERY 30 DAYS. 1 mL 0  . Paliperidone ER (INVEGA SUSTENNA) injection Inject 156 mg into the muscle every 30 (thirty) days. 0.9 mL 1  . rosuvastatin (CRESTOR) 5 MG tablet  Take 1 tablet (5 mg total) by mouth daily. 30 tablet 1   No current facility-administered medications for this visit.      Psychiatric Specialty Exam: Review of Systems  Cardiovascular: Negative for chest pain.  Psychiatric/Behavioral: Negative for depression and substance abuse.    There were no vitals taken for this visit.There is no height or weight on file to calculate BMI.  General Appearance:   Eye Contact:    Speech:  Low   Volume:  Decreased  Mood: fair  Affect:   Thought Process:  Goal Directed  Orientation:  Full (Time, Place, and Person)  Thought Content:  Rumination  Suicidal Thoughts:  No  Homicidal Thoughts:  No  Memory:  Immediate;   Fair Recent;   Fair  Judgement:  Fair  Insight:  Fair  Psychomotor Activity:    Concentration:  Concentration: Fair and Attention Span: Fair  Recall:  [employer] of Knowledge:Fair  Language: Fair  Akathisia:    Handed:  Right  AIMS (if indicated): no involuntary movements  Assets:  Desire for Improvement Social Support  ADL's:  Intact  Cognition: WNL  Sleep:  fair  Past documentation copy reviewed  Treatment Plan Summary: Medication management and Plan as follows   1. Bipolar per history: manic ; doing fair, continue depot injection and depakote. No tremors Not agitated or confrontive 2. Anxiety;  Manageable.  3. Sleep:fair did not endorse concerns today  Mostly discussed compliance and to call back for any concerns will fu again within few weeks.  Fu 2m. Continue depot injection invega monthly  Thresa Ross MD

## 2020-10-10 ENCOUNTER — Other Ambulatory Visit (HOSPITAL_COMMUNITY): Payer: Self-pay | Admitting: Psychiatry

## 2020-10-18 ENCOUNTER — Ambulatory Visit (INDEPENDENT_AMBULATORY_CARE_PROVIDER_SITE_OTHER): Payer: Medicare HMO

## 2020-10-18 ENCOUNTER — Other Ambulatory Visit (HOSPITAL_COMMUNITY): Payer: Self-pay

## 2020-10-18 ENCOUNTER — Other Ambulatory Visit: Payer: Self-pay

## 2020-10-18 ENCOUNTER — Encounter (HOSPITAL_COMMUNITY): Payer: Self-pay

## 2020-10-18 VITALS — BP 130/80 | Temp 98.7°F | Ht 63.5 in | Wt 207.0 lb

## 2020-10-18 DIAGNOSIS — F312 Bipolar disorder, current episode manic severe with psychotic features: Secondary | ICD-10-CM

## 2020-10-18 MED ORDER — PALIPERIDONE PALMITATE ER 156 MG/ML IM SUSY
156.0000 mg | PREFILLED_SYRINGE | Freq: Once | INTRAMUSCULAR | Status: AC
  Administered 2020-10-18: 156 mg via INTRAMUSCULAR

## 2020-10-18 MED ORDER — INVEGA SUSTENNA 156 MG/ML IM SUSY
PREFILLED_SYRINGE | INTRAMUSCULAR | 1 refills | Status: DC
Start: 2020-10-18 — End: 2020-11-14

## 2020-10-18 NOTE — Progress Notes (Signed)
Patient came into office to receive Invega Sustenna 156mg /ml. Patient appeared stable. Patient stated she felt good. She was prepared for her injection and wanted it to be in the right deltoid. Medication was administered to the patient in right deltoid. Patient stated no problems after injection and agreed to come back in 4 weeks for the nextscheduled injection. No suicidal ideation or mania present.

## 2020-11-13 ENCOUNTER — Other Ambulatory Visit (HOSPITAL_COMMUNITY): Payer: Self-pay | Admitting: Psychiatry

## 2020-11-15 ENCOUNTER — Ambulatory Visit (INDEPENDENT_AMBULATORY_CARE_PROVIDER_SITE_OTHER): Payer: Medicare HMO

## 2020-11-15 ENCOUNTER — Encounter (HOSPITAL_COMMUNITY): Payer: Self-pay

## 2020-11-15 ENCOUNTER — Other Ambulatory Visit: Payer: Self-pay

## 2020-11-15 VITALS — BP 140/100 | Temp 98.8°F | Ht 63.5 in | Wt 210.0 lb

## 2020-11-15 DIAGNOSIS — F312 Bipolar disorder, current episode manic severe with psychotic features: Secondary | ICD-10-CM | POA: Diagnosis not present

## 2020-11-15 MED ORDER — PALIPERIDONE PALMITATE ER 156 MG/ML IM SUSY
156.0000 mg | PREFILLED_SYRINGE | Freq: Once | INTRAMUSCULAR | Status: AC
  Administered 2020-11-15: 156 mg via INTRAMUSCULAR

## 2020-11-15 NOTE — Progress Notes (Signed)
Patient came into office to receive Invega Sustenna 156mg/ml. Patient appeared stable. Patient states that injection site has been doing well. Patient stated she felt good, no pain or depression. She was prepared for her injection and wanted it to be in the right deltoid. Medication was administered to the patient in right deltoid. Patient stated no problems after injection and agreed to come back in 4 weeks for the next scheduled injection. No suicidal ideation or mania present.  °

## 2020-11-27 ENCOUNTER — Other Ambulatory Visit (HOSPITAL_COMMUNITY): Payer: Self-pay | Admitting: Psychiatry

## 2020-12-08 ENCOUNTER — Encounter (HOSPITAL_COMMUNITY): Payer: Self-pay | Admitting: Psychiatry

## 2020-12-08 ENCOUNTER — Telehealth (INDEPENDENT_AMBULATORY_CARE_PROVIDER_SITE_OTHER): Payer: Medicare HMO | Admitting: Psychiatry

## 2020-12-08 DIAGNOSIS — F310 Bipolar disorder, current episode hypomanic: Secondary | ICD-10-CM

## 2020-12-08 NOTE — Progress Notes (Signed)
[facility] Outpatient Follow up visit  Patient Identification: Heather Marquez MRN:  [id number] Date of Evaluation:  12/08/2020 Referral Source: [facility] mental health Chief Complaint:    Fu Bipolar Visit Diagnosis:    ICD-10-CM   1. Bipolar disorder, current episode hypomanic (HCC)  F31.0    Virtual Visit via Telephone Note  I connected with [name] on 12/08/20 at  1:30 PM EDT by telephone and verified that I am speaking with the correct person using two identifiers.  Location: Patient: home Provider: office   I discussed the limitations, risks, security and privacy concerns of performing an evaluation and management service by telephone and the availability of in person appointments. I also discussed with the patient that there may be a patient responsible charge related to this service. The patient expressed understanding and agreed to proceed.    I discussed the assessment and treatment plan with the patient. The patient was provided an opportunity to ask questions and all were answered. The patient agreed with the plan and demonstrated an understanding of the instructions.   The patient was advised to call back or seek an in-person evaluation if the symptoms worsen or if the condition fails to improve as anticipated.  I provided 11  minutes of non-face-to-face time during this encounter      History of Present Illness:  60   years old currently married African-American female initially referred by [facility] committee mental healthcare after her relocation here.  Continues to do well on injection depot and depakote Less paranoid and more alert Supportive husband   Aggravating factor: non compliant history Modifying factor: husband No tremors described  Past Psychiatric History: bipolar, manic as per admission history  Previous Psychotropic Medications: Yes     Family History: History reviewed. No pertinent family history.  Social History:   Social History    Socioeconomic History  . Marital status: Married    Spouse name: Not on file  . Number of children: Not on file  . Years of education: Not on file  . Highest education level: Not on file  Occupational History  . Not on file  Tobacco Use  . Smoking status: Never Smoker  . Smokeless tobacco: Never Used  Vaping Use  . Vaping Use: Never used  Substance and Sexual Activity  . Alcohol use: No  . Drug use: No  . Sexual activity: Yes    Partners: Male  Other Topics Concern  . Not on file  Social History Narrative  . Not on file   Social Determinants of Health   Financial Resource Strain: Not on file  Food Insecurity: Not on file  Transportation Needs: Not on file  Physical Activity: Not on file  Stress: Not on file  Social Connections: Not on file      Allergies:   Allergies  Allergen Reactions  . Mango Flavor Rash    Metabolic Disorder Labs: Lab Results  Component Value Date   HGBA1C 5.6 10/01/2019   MPG 114.02 10/01/2019   No results found for: PROLACTIN Lab Results  Component Value Date   CHOL 160 10/01/2019   TRIG 46 10/01/2019   HDL 71 10/01/2019   CHOLHDL 2.3 10/01/2019   VLDL 9 10/01/2019   LDLCALC 80 10/01/2019     Current Medications: Current Outpatient Medications  Medication Sig Dispense Refill  . amLODipine (NORVASC) 10 MG tablet Take 1 tablet (10 mg total) by mouth daily. 30 tablet 1  . divalproex (DEPAKOTE) 500 MG DR tablet TAKE 1 TABLET(500  MG) BY MOUTH AT BEDTIME 90 tablet 0  . paliperidone (INVEGA SUSTENNA) 156 MG/ML SUSY injection ADMINISTER 1 ML IN THE MUSCLE EVERY 30 DAYS 1 mL 1  . Paliperidone ER (INVEGA SUSTENNA) injection Inject 156 mg into the muscle every 30 (thirty) days. 0.9 mL 1  . rosuvastatin (CRESTOR) 5 MG tablet Take 1 tablet (5 mg total) by mouth daily. 30 tablet 1   No current facility-administered medications for this visit.      Psychiatric Specialty Exam: Review of Systems  Cardiovascular: Negative for chest  pain.  Psychiatric/Behavioral: Negative for depression and substance abuse.    There were no vitals taken for this visit.There is no height or weight on file to calculate BMI.  General Appearance:   Eye Contact:    Speech:  Low   Volume:  Decreased  Mood: fair  Affect:   Thought Process:  Goal Directed  Orientation:  Full (Time, Place, and Person)  Thought Content:  Rumination  Suicidal Thoughts:  No  Homicidal Thoughts:  No  Memory:  Immediate;   Fair Recent;   Fair  Judgement:  Fair  Insight:  Fair  Psychomotor Activity:    Concentration:  Concentration: Fair and Attention Span: Fair  Recall:  [employer] of Knowledge:Fair  Language: Fair  Akathisia:    Handed:  Right  AIMS (if indicated): no involuntary movements  Assets:  Desire for Improvement Social Support  ADL's:  Intact  Cognition: WNL  Sleep:  fair  Past documentation copy reviewed  Treatment Plan Summary: Medication management and Plan as follows   1. Bipolar per history: manic ;stable, continue injection invega and depakote 2. Anxiety;  Manageable.  3. Sleep:fair did not endorse concerns today   Fu 62m in office, . Continue depot injection invega monthly  Thresa Ross MD

## 2020-12-12 ENCOUNTER — Ambulatory Visit (HOSPITAL_COMMUNITY): Payer: Medicare HMO | Admitting: Psychiatry

## 2020-12-13 ENCOUNTER — Ambulatory Visit (INDEPENDENT_AMBULATORY_CARE_PROVIDER_SITE_OTHER): Payer: Medicare HMO

## 2020-12-13 ENCOUNTER — Other Ambulatory Visit (HOSPITAL_COMMUNITY): Payer: Self-pay

## 2020-12-13 ENCOUNTER — Encounter (HOSPITAL_COMMUNITY): Payer: Self-pay

## 2020-12-13 VITALS — BP 152/96 | Temp 98.6°F | Ht 63.5 in | Wt 211.0 lb

## 2020-12-13 DIAGNOSIS — F312 Bipolar disorder, current episode manic severe with psychotic features: Secondary | ICD-10-CM | POA: Diagnosis not present

## 2020-12-13 MED ORDER — INVEGA SUSTENNA 156 MG/ML IM SUSY: PREFILLED_SYRINGE | INTRAMUSCULAR | 1 refills | Status: DC

## 2020-12-13 MED ORDER — PALIPERIDONE PALMITATE ER 156 MG/ML IM SUSY
156.0000 mg | PREFILLED_SYRINGE | Freq: Once | INTRAMUSCULAR | Status: AC
Start: 2020-12-13 — End: 2020-12-13
  Administered 2020-12-13: 156 mg via INTRAMUSCULAR

## 2020-12-13 NOTE — Progress Notes (Signed)
Patient came into office to receive Invega Sustenna 156mg/ml. Patient appeared stable. Patient states that injection site has been doing well. Patient stated she felt good, no pain or depression. She was prepared for her injection and wanted it to be in the right deltoid. Medication was administered to the patient in right deltoid. Patient stated no problems after injection and agreed to come back in 4 weeks for the next scheduled injection. No suicidal ideation or mania present.  °

## 2021-01-10 ENCOUNTER — Ambulatory Visit (INDEPENDENT_AMBULATORY_CARE_PROVIDER_SITE_OTHER): Payer: Medicare HMO

## 2021-01-10 ENCOUNTER — Encounter (HOSPITAL_COMMUNITY): Payer: Self-pay

## 2021-01-10 VITALS — BP 150/100 | Temp 98.7°F | Ht 63.5 in | Wt 210.0 lb

## 2021-01-10 DIAGNOSIS — F312 Bipolar disorder, current episode manic severe with psychotic features: Secondary | ICD-10-CM

## 2021-01-10 MED ORDER — PALIPERIDONE PALMITATE ER 156 MG/ML IM SUSY
156.0000 mg | PREFILLED_SYRINGE | Freq: Once | INTRAMUSCULAR | Status: AC
  Administered 2021-01-10: 156 mg via INTRAMUSCULAR

## 2021-01-10 NOTE — Progress Notes (Signed)
Patient came into office to receive Invega Sustenna 156mg/ml. Patient appeared stable. Patient states that injection site has been doing well. Patient stated she felt good, no pain or depression. She was prepared for her injection and wanted it to be in the right deltoid. Medication was administered to the patient in right deltoid. Patient stated no problems after injection and agreed to come back in 4 weeks for the next scheduled injection. No suicidal ideation or mania present.  °

## 2021-02-07 ENCOUNTER — Other Ambulatory Visit (HOSPITAL_COMMUNITY): Payer: Self-pay | Admitting: Psychiatry

## 2021-02-07 ENCOUNTER — Ambulatory Visit (HOSPITAL_COMMUNITY): Payer: Medicare HMO | Admitting: Psychiatry

## 2021-02-08 ENCOUNTER — Encounter (HOSPITAL_COMMUNITY): Payer: Self-pay

## 2021-02-08 ENCOUNTER — Ambulatory Visit (HOSPITAL_COMMUNITY): Payer: Medicare HMO

## 2021-02-08 VITALS — BP 138/80 | HR 98 | Ht 64.5 in | Wt 207.0 lb

## 2021-02-08 DIAGNOSIS — F310 Bipolar disorder, current episode hypomanic: Secondary | ICD-10-CM

## 2021-02-08 DIAGNOSIS — F319 Bipolar disorder, unspecified: Secondary | ICD-10-CM

## 2021-02-08 MED ORDER — PALIPERIDONE PALMITATE ER 156 MG/ML IM SUSY
156.0000 mg | PREFILLED_SYRINGE | INTRAMUSCULAR | Status: DC
  Administered 2021-02-08: 156 mg via INTRAMUSCULAR

## 2021-02-08 MED ORDER — PALIPERIDONE PALMITATE ER 156 MG/ML IM SUSY
156.0000 mg | PREFILLED_SYRINGE | Freq: Once | INTRAMUSCULAR | Status: AC
  Administered 2021-02-08: 156 mg via INTRAMUSCULAR

## 2021-02-08 NOTE — Progress Notes (Unsigned)
Patient presented with flat affect, level and pleasant mood.  Patient denied any auditory or visual hallucinations, no thoughts of wanting to harm self or others and no plan or intent to want to harm self or others.  Patient with only concern at times she is noticing some slight right hand tremors but denies all the time.  Patient will discuss with provider at next appointment scheduled for 02/28/21.  Patient reported feeling she is doing well on medication and this is the only concern she wants to discuss with provider but would like not to change or be placed on any other additional medications at this time.  Patient's orders reviewed and Invega Sustenna 156 mg/ml IM injection prrepared as ordered and given to patient in her requested right deltoid area.  Patient tolerated due injection without complaint of pain or discomfort and will call back if any problems prior to appointment scheduled 02/28/21.  Patient stable upon leaving with no other concerns expressed today.

## 2021-02-28 ENCOUNTER — Ambulatory Visit (INDEPENDENT_AMBULATORY_CARE_PROVIDER_SITE_OTHER): Payer: Medicare HMO | Admitting: Psychiatry

## 2021-02-28 ENCOUNTER — Encounter (HOSPITAL_COMMUNITY): Payer: Self-pay | Admitting: Psychiatry

## 2021-02-28 DIAGNOSIS — F310 Bipolar disorder, current episode hypomanic: Secondary | ICD-10-CM

## 2021-02-28 DIAGNOSIS — G259 Extrapyramidal and movement disorder, unspecified: Secondary | ICD-10-CM

## 2021-02-28 MED ORDER — BENZTROPINE MESYLATE 0.5 MG PO TABS: 0.5000 mg | ORAL_TABLET | Freq: Every day | ORAL | 1 refills | Status: DC

## 2021-02-28 NOTE — Progress Notes (Signed)
[facility] Outpatient Follow up visit  Patient Identification: Heather Marquez MRN:  [id number] Date of Evaluation:  02/28/2021 Referral Source: [facility] mental health Chief Complaint:    Fu Bipolar Visit Diagnosis:    ICD-10-CM   1. Bipolar disorder, current episode hypomanic (HCC)  F31.0 Valproic acid level    2. 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  Patient seen in office she has some tremors on the right arm describes it to be sporadic.  Otherwise mood wise doing fairly well does not endorse paranoia or hallucinations she is on injections sustained discussed the dose of possible adding some medication for EPS Depakote is at night will write for Depakote level  Husband is supportive   Aggravating factor: non compliant history Modifying factor: Husband No tremors described  Past Psychiatric History: bipolar, manic as per admission history  Previous Psychotropic Medications: Yes     Family History: History reviewed. No pertinent family history.  Social History:   Social History   Socioeconomic History   Marital status: Married    Spouse name: Not on file   Number of children: Not on file   Years of education: Not on file   Highest education level: Not on file  Occupational History   Not on file  Tobacco Use   Smoking status: Never   Smokeless tobacco: Never  Vaping Use   Vaping Use: Never used  Substance and Sexual Activity   Alcohol use: No   Drug use: No   Sexual activity: Yes    Partners: Male  Other Topics Concern   Not on file  Social History Narrative   Not on file   Social Determinants of Health   Financial Resource Strain: Not on file  Food Insecurity: Not on file  Transportation Needs: Not on file  Physical Activity: Not on file  Stress: Not on file  Social Connections:  Not on file      Allergies:   Allergies  Allergen Reactions   Mango Flavor Rash    Metabolic Disorder Labs: Lab Results  Component Value Date   HGBA1C 5.6 10/01/2019   MPG 114.02 10/01/2019   No results found for: PROLACTIN Lab Results  Component Value Date   CHOL 160 10/01/2019   TRIG 46 10/01/2019   HDL 71 10/01/2019   CHOLHDL 2.3 10/01/2019   VLDL 9 10/01/2019   LDLCALC 80 10/01/2019     Current Medications: Current Outpatient Medications  Medication Sig Dispense Refill   benztropine (COGENTIN) 0.5 MG tablet Take 1 tablet (0.5 mg total) by mouth daily. 30 tablet 1   amLODipine (NORVASC) 10 MG tablet Take 1 tablet (10 mg total) by mouth daily. 30 tablet 1   divalproex (DEPAKOTE) 500 MG DR tablet TAKE 1 TABLET AT BEDTIME 90 tablet 0   paliperidone (INVEGA SUSTENNA) 156 MG/ML SUSY injection ADMINISTER 1 ML IN THE MUSCLE EVERY 30 DAYS 1 mL 1   Paliperidone ER (INVEGA SUSTENNA) injection Inject 156 mg into the muscle every 30 (thirty) days. 0.9 mL 1   rosuvastatin (CRESTOR) 5 MG tablet Take 1 tablet (5 mg total) by mouth daily. 30 tablet 1   No current facility-administered medications for this visit.      Psychiatric Specialty Exam: Review of Systems  Cardiovascular:  Negative for chest pain.  Psychiatric/Behavioral:  Negative for depression and  substance abuse.    There were no vitals taken for this visit.There is no height or weight on file to calculate BMI.  General Appearance: Casual  Eye Contact: Fair  Speech:  Low   Volume:  Decreased  Mood: fair  Affect:   Thought Process:  Goal Directed  Orientation:  Full (Time, Place, and Person)  Thought Content:  Rumination  Suicidal Thoughts:  No  Homicidal Thoughts:  No  Memory:  Immediate;   Fair Recent;   Fair  Judgement:  Fair  Insight:  Fair  Psychomotor Activity:    Concentration:  Concentration: Fair and Attention Span: Fair  Recall:  [employer] of Knowledge:Fair  Language: Fair  Akathisia:     Handed:  Right  AIMS (if indicated): no involuntary movements  Assets:  Desire for Improvement Social Support  ADL's:  Intact  Cognition: WNL  Sleep:  fair  Past documentation copy reviewed  Treatment Plan Summary: Medication management and Plan as follows   Prior documentation reviewed 1. Bipolar per history: manic ; stable we will continue to consistent at current dose and Depakote but did add Cogentin for side effects considering she is stable want to continue the same dose of injection as of now  Writing for Depakote blood level as well  2. Anxiety; manageable 3. Sleep:fair did not endorse concerns today   Fu 33m in office, . Continue depot injection invega monthly Will send to lab for Depakote level in case symptoms or side effects get worse patient to call us earlier Time spent face-to-face 15 minutes in office Thresa Ross MD

## 2021-03-01 LAB — VALPROIC ACID LEVEL: Valproic Acid Lvl: 55 mg/L (ref 50.0–100.0)

## 2021-03-07 ENCOUNTER — Other Ambulatory Visit (HOSPITAL_COMMUNITY): Payer: Self-pay | Admitting: *Deleted

## 2021-03-07 ENCOUNTER — Ambulatory Visit (HOSPITAL_COMMUNITY): Payer: Medicare HMO | Admitting: Psychiatry

## 2021-03-07 MED ORDER — INVEGA SUSTENNA 156 MG/ML IM SUSY: PREFILLED_SYRINGE | INTRAMUSCULAR | 1 refills | Status: DC

## 2021-03-08 ENCOUNTER — Ambulatory Visit (INDEPENDENT_AMBULATORY_CARE_PROVIDER_SITE_OTHER): Payer: Medicare HMO

## 2021-03-08 ENCOUNTER — Encounter (HOSPITAL_COMMUNITY): Payer: Self-pay

## 2021-03-08 VITALS — BP 128/88 | Temp 98.8°F | Ht 64.5 in | Wt 204.0 lb

## 2021-03-08 DIAGNOSIS — F310 Bipolar disorder, current episode hypomanic: Secondary | ICD-10-CM | POA: Diagnosis not present

## 2021-03-08 MED ORDER — PALIPERIDONE PALMITATE ER 156 MG/ML IM SUSY
156.0000 mg | PREFILLED_SYRINGE | Freq: Once | INTRAMUSCULAR | Status: AC
  Administered 2021-03-08: 156 mg via INTRAMUSCULAR

## 2021-03-08 NOTE — Patient Instructions (Signed)
Follow up in 4 weeks for next injection

## 2021-03-08 NOTE — Progress Notes (Signed)
Patient came into office to receive Invega Sustenna 156mg /ml. Patient appeared stable. Patient states that injection site has been doing well. Patient stated she felt good, no pain or depression. She was prepared for her injection and wanted it to be in the left deltoid. Medication was administered to the patient in left deltoid. Patient stated no problems after injection and agreed to come back in 4 weeks for the next scheduled injection. No suicidal ideation or mania present.

## 2021-03-10 ENCOUNTER — Encounter (HOSPITAL_COMMUNITY): Payer: Self-pay

## 2021-03-28 ENCOUNTER — Other Ambulatory Visit (HOSPITAL_COMMUNITY): Payer: Self-pay

## 2021-03-28 MED ORDER — BENZTROPINE MESYLATE 0.5 MG PO TABS: 0.5000 mg | ORAL_TABLET | Freq: Every day | ORAL | 0 refills | Status: DC

## 2021-04-04 ENCOUNTER — Other Ambulatory Visit (HOSPITAL_COMMUNITY): Payer: Self-pay | Admitting: Psychiatry

## 2021-04-04 ENCOUNTER — Ambulatory Visit (INDEPENDENT_AMBULATORY_CARE_PROVIDER_SITE_OTHER): Payer: Medicare HMO | Admitting: *Deleted

## 2021-04-04 ENCOUNTER — Other Ambulatory Visit: Payer: Self-pay

## 2021-04-04 VITALS — BP 127/82 | HR 88 | Temp 98.9°F | Ht 65.5 in | Wt 201.6 lb

## 2021-04-04 DIAGNOSIS — F310 Bipolar disorder, current episode hypomanic: Secondary | ICD-10-CM

## 2021-04-04 MED ORDER — PALIPERIDONE PALMITATE ER 156 MG/ML IM SUSY
156.0000 mg | PREFILLED_SYRINGE | INTRAMUSCULAR | Status: DC
  Administered 2021-04-04 – 2022-11-22 (×5): 156 mg via INTRAMUSCULAR

## 2021-04-04 MED ORDER — PALIPERIDONE PALMITATE ER 156 MG/ML IM SUSY
156.0000 mg | PREFILLED_SYRINGE | INTRAMUSCULAR | Status: DC
  Administered 2021-04-04: 156 mg via INTRAMUSCULAR

## 2021-04-04 NOTE — Progress Notes (Addendum)
Patient came in to get her injection for [place] 156mg /mL. Injection was given in the Left Deltoid. Patient did not C/O anything and did not have reaction to injection. Patient stayed for 15 mins to observe and no side effects.

## 2021-04-04 NOTE — Patient Instructions (Signed)
Follow-up in 4 weeks

## 2021-04-05 ENCOUNTER — Encounter (HOSPITAL_COMMUNITY): Payer: Self-pay | Admitting: *Deleted

## 2021-04-18 ENCOUNTER — Ambulatory Visit (INDEPENDENT_AMBULATORY_CARE_PROVIDER_SITE_OTHER): Payer: Medicare HMO | Admitting: Psychiatry

## 2021-04-18 ENCOUNTER — Encounter (HOSPITAL_COMMUNITY): Payer: Self-pay | Admitting: Psychiatry

## 2021-04-18 VITALS — BP 136/80 | HR 105 | Ht 64.0 in | Wt 202.0 lb

## 2021-04-18 DIAGNOSIS — F310 Bipolar disorder, current episode hypomanic: Secondary | ICD-10-CM

## 2021-04-18 DIAGNOSIS — G259 Extrapyramidal and movement disorder, unspecified: Secondary | ICD-10-CM

## 2021-04-18 MED ORDER — INVEGA SUSTENNA 156 MG/ML IM SUSY: PREFILLED_SYRINGE | INTRAMUSCULAR | 1 refills | Status: DC

## 2021-04-18 MED ORDER — DIVALPROEX SODIUM 500 MG PO DR TAB: DELAYED_RELEASE_TABLET | ORAL | 0 refills | Status: DC

## 2021-04-18 NOTE — Progress Notes (Signed)
[facility] Outpatient Follow up visit  Patient Identification: Heather Marquez MRN:  [id number] Date of Evaluation:  04/18/2021 Referral Source: [facility] mental health Chief Complaint:    Fu Bipolar Visit Diagnosis:    ICD-10-CM   1. Bipolar disorder, current episode hypomanic (HCC)  F31.0     2. 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  Tremors of hands improved with cogentin, feels content . Depakote level is 54. 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 No pertinent family history.  Social History:   Social History   Socioeconomic History   Marital status: Married    Spouse name: Not on file   Number of children: Not on file   Years of education: Not on file   Highest education level: Not on file  Occupational History   Not on file  Tobacco Use   Smoking status: Never   Smokeless tobacco: Never  Vaping Use   Vaping Use: Never used  Substance and Sexual Activity   Alcohol use: No   Drug use: No   Sexual activity: Yes    Partners: Male  Other Topics Concern   Not on file  Social History Narrative   Not on file   Social Determinants of Health   Financial Resource Strain: Not on file  Food Insecurity: Not on file  Transportation Needs: Not on file  Physical Activity: Not on file  Stress: Not on file  Social Connections: Not on file      Allergies:   Allergies  Allergen Reactions   Mango Flavor Rash    Metabolic Disorder Labs: Lab Results   Component Value Date   HGBA1C 5.6 10/01/2019   MPG 114.02 10/01/2019   No results found for: PROLACTIN Lab Results  Component Value Date   CHOL 160 10/01/2019   TRIG 46 10/01/2019   HDL 71 10/01/2019   CHOLHDL 2.3 10/01/2019   VLDL 9 10/01/2019   LDLCALC 80 10/01/2019     Current Medications: Current Outpatient Medications  Medication Sig Dispense Refill   amLODipine (NORVASC) 10 MG tablet Take 1 tablet (10 mg total) by mouth daily. 30 tablet 1   benztropine (COGENTIN) 0.5 MG tablet Take 1 tablet (0.5 mg total) by mouth daily. 90 tablet 0   Paliperidone ER (INVEGA SUSTENNA) injection Inject 156 mg into the muscle every 30 (thirty) days. 0.9 mL 1   rosuvastatin (CRESTOR) 5 MG tablet Take 1 tablet (5 mg total) by mouth daily. 30 tablet 1   divalproex (DEPAKOTE) 500 MG DR tablet TAKE 1 TABLET AT BEDTIME 90 tablet 0   paliperidone (INVEGA SUSTENNA) 156 MG/ML SUSY injection ADMINISTER 1 ML IN THE MUSCLE EVERY 30 DAYS 1 mL 1   Current Facility-Administered Medications  Medication Dose Route Frequency Provider Last Rate Last Admin   paliperidone (INVEGA SUSTENNA) injection 156 mg  156 mg Intramuscular Q30 days Lonie Peak, MD   156 mg at 04/04/21 1037      Psychiatric Specialty Exam: Review of Systems  Cardiovascular:  Negative  for chest pain.  Psychiatric/Behavioral:  Negative for depression and substance abuse.    Blood pressure 136/80, pulse (!) 105, height 5\' 4"  (1.626 m), weight 202 lb (91.6 kg), SpO2 98 %.Body mass index is 34.67 kg/m.  General Appearance: Casual  Eye Contact: Fair  Speech:  Low   Volume:  Decreased  Mood: fair  Affect:   Thought Process:  Goal Directed  Orientation:  Full (Time, Place, and Person)  Thought Content:  Rumination  Suicidal Thoughts:  No  Homicidal Thoughts:  No  Memory:  Immediate;   Fair Recent;   Fair  Judgement:  Fair  Insight:  Fair  Psychomotor Activity:    Concentration:  Concentration: Fair and Attention Span: Fair   Recall:  of Knowledge:Fair  Language: Fair  Akathisia:    Handed:  Right  AIMS (if indicated): no involuntary movements  Assets:  Desire for Improvement Social Support  ADL's:  Intact  Cognition: WNL  Sleep:  fair  Past documentation copy reviewed  Treatment Plan Summary: Medication management and Plan as follows   Prior documentation reviewed 1. Bipolar per history: manic ; doing reasonable continue Depakote Depakote levels checked continue Cogentin for side effects and injection Invega compliance discussed  2. Anxiety; manageable 3. Sleep:fair did not endorse concerns today Follow-up in 3 months continue injection visits Time spent face-to-face 15 minutes in office [employer] MD

## 2021-05-09 ENCOUNTER — Ambulatory Visit (HOSPITAL_COMMUNITY): Payer: Medicare HMO | Admitting: Psychiatry

## 2021-05-10 ENCOUNTER — Encounter (HOSPITAL_COMMUNITY): Payer: Self-pay

## 2021-05-10 ENCOUNTER — Ambulatory Visit (INDEPENDENT_AMBULATORY_CARE_PROVIDER_SITE_OTHER): Payer: Medicare HMO

## 2021-05-10 VITALS — BP 142/88 | Ht 65.5 in | Wt 201.0 lb

## 2021-05-10 DIAGNOSIS — F312 Bipolar disorder, current episode manic severe with psychotic features: Secondary | ICD-10-CM | POA: Diagnosis not present

## 2021-05-10 MED ORDER — PALIPERIDONE PALMITATE ER 156 MG/ML IM SUSY
156.0000 mg | PREFILLED_SYRINGE | Freq: Once | INTRAMUSCULAR | Status: AC
  Administered 2021-05-10: 156 mg via INTRAMUSCULAR

## 2021-05-10 NOTE — Progress Notes (Signed)
Patient came into office to receive Invega Sustenna 156mg/ml. Patient appeared stable. Patient states that injection site has been doing well. Patient stated she felt good, no pain or depression. She was prepared for her injection and wanted it to be in the right deltoid. Medication was administered to the patient in right deltoid. Patient stated no problems after injection and agreed to come back in 4 weeks for the next scheduled injection. No suicidal ideation or mania present.  °

## 2021-06-06 ENCOUNTER — Ambulatory Visit (INDEPENDENT_AMBULATORY_CARE_PROVIDER_SITE_OTHER): Payer: Medicare HMO

## 2021-06-06 ENCOUNTER — Encounter (HOSPITAL_COMMUNITY): Payer: Self-pay

## 2021-06-06 VITALS — BP 138/90 | Temp 98.6°F | Ht 65.5 in | Wt 199.0 lb

## 2021-06-06 DIAGNOSIS — F312 Bipolar disorder, current episode manic severe with psychotic features: Secondary | ICD-10-CM | POA: Diagnosis not present

## 2021-06-06 MED ORDER — PALIPERIDONE PALMITATE ER 156 MG/ML IM SUSY
156.0000 mg | PREFILLED_SYRINGE | Freq: Once | INTRAMUSCULAR | Status: AC
  Administered 2021-06-06: 156 mg via INTRAMUSCULAR

## 2021-06-06 NOTE — Progress Notes (Signed)
Patient came into office to receive Invega Sustenna 156mg /ml. Patient appeared stable. Patient states that injection site has been doing well. Patient stated she felt good, no pain or depression. Patient says she is losing weight and feels good about that. She was prepared for her injection and wanted it to be in the left deltoid. Medication was administered to the patient in left deltoid. Patient stated no problems after injection and agreed to come back in 4 weeks for the next scheduled injection. No suicidal ideation or mania present.

## 2021-06-20 ENCOUNTER — Other Ambulatory Visit (HOSPITAL_COMMUNITY): Payer: Self-pay

## 2021-06-23 ENCOUNTER — Other Ambulatory Visit (HOSPITAL_COMMUNITY): Payer: Self-pay | Admitting: Psychiatry

## 2021-07-04 ENCOUNTER — Ambulatory Visit (HOSPITAL_COMMUNITY): Payer: Medicare HMO | Admitting: Psychiatry

## 2021-07-06 ENCOUNTER — Ambulatory Visit (INDEPENDENT_AMBULATORY_CARE_PROVIDER_SITE_OTHER): Payer: Medicare HMO | Admitting: Psychiatry

## 2021-07-06 ENCOUNTER — Encounter (HOSPITAL_COMMUNITY): Payer: Self-pay | Admitting: Psychiatry

## 2021-07-06 VITALS — BP 136/88 | HR 72 | Ht 64.0 in | Wt 196.0 lb

## 2021-07-06 DIAGNOSIS — F310 Bipolar disorder, current episode hypomanic: Secondary | ICD-10-CM

## 2021-07-06 DIAGNOSIS — F312 Bipolar disorder, current episode manic severe with psychotic features: Secondary | ICD-10-CM

## 2021-07-06 NOTE — Progress Notes (Signed)
Administered paliperidone (INVEGA SUSTENNA) 156 MG/ML SUSY injection --left deltoid. Pt tolerated well and reminded pt to return in 28 days.

## 2021-07-18 ENCOUNTER — Ambulatory Visit (HOSPITAL_COMMUNITY): Payer: Medicare HMO | Admitting: Psychiatry

## 2021-07-19 ENCOUNTER — Encounter (HOSPITAL_COMMUNITY): Payer: Self-pay | Admitting: Psychiatry

## 2021-07-19 ENCOUNTER — Telehealth (INDEPENDENT_AMBULATORY_CARE_PROVIDER_SITE_OTHER): Payer: Medicare HMO | Admitting: Psychiatry

## 2021-07-19 DIAGNOSIS — F310 Bipolar disorder, current episode hypomanic: Secondary | ICD-10-CM | POA: Diagnosis not present

## 2021-07-19 DIAGNOSIS — G259 Extrapyramidal and movement disorder, unspecified: Secondary | ICD-10-CM | POA: Diagnosis not present

## 2021-07-19 NOTE — Progress Notes (Signed)
[facility] Outpatient Follow up visit  Patient Identification: Heather Marquez MRN:  [id number] Date of Evaluation:  07/19/2021 Referral Source: [facility] mental health Chief Complaint:    Fu Bipolar Visit Diagnosis:    ICD-10-CM   1. Bipolar disorder, current episode hypomanic (HCC)  F31.0     2. 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  Location: Patient: home Provider: home office   I discussed the limitations, risks, security and privacy concerns of performing an evaluation and management service by telephone and the availability of in person appointments. I also discussed with the patient that there may be a patient responsible charge related to this service. The patient expressed understanding and agreed to proceed.      I discussed the assessment and treatment plan with the patient. The patient was provided an opportunity to ask questions and all were answered. The patient agreed with the plan and demonstrated an understanding of the instructions.   The patient was advised to call back or seek an in-person evaluation if the symptoms worsen or if the condition fails to improve as anticipated.  I provided 12 minutes of non-face-to-face time during this encounter.     History of Present Illness:  60   years old currently married African-American female initially referred by [facility] committee mental healthcare after her relocation here.   Doing fair, works PT taking care of an elderly. Cogentin has helped tremors Inj invega monthly has helped compliance and stabilization  Aggravating factor: non compliant history Modifying factor: husband   Past Psychiatric History: bipolar, manic as per admission history  Previous Psychotropic Medications: Yes     Family History: History reviewed. No  pertinent family history.  Social History:   Social History   Socioeconomic History   Marital status: Married    Spouse name: Not on file   Number of children: Not on file   Years of education: Not on file   Highest education level: Not on file  Occupational History   Not on file  Tobacco Use   Smoking status: Never   Smokeless tobacco: Never  Vaping Use   Vaping Use: Never used  Substance and Sexual Activity   Alcohol use: No   Drug use: No   Sexual activity: Yes    Partners: Male  Other Topics Concern   Not on file  Social History Narrative   Not on file   Social Determinants of Health   Financial Resource Strain: Not on file  Food Insecurity: Not on file  Transportation Needs: Not on file  Physical Activity: Not on file  Stress: Not on file  Social Connections: Not on file      Allergies:   Allergies  Allergen Reactions   Mango Flavor Rash    Metabolic Disorder Labs: Lab Results  Component Value Date   HGBA1C 5.6 10/01/2019   MPG 114.02 10/01/2019   No results found for: PROLACTIN Lab Results  Component Value Date   CHOL 160 10/01/2019   TRIG 46 10/01/2019   HDL 71 10/01/2019   CHOLHDL 2.3 10/01/2019   VLDL 9 10/01/2019   LDLCALC 80 10/01/2019     Current Medications: Current Outpatient Medications  Medication Sig Dispense Refill   amLODipine (NORVASC) 10 MG tablet Take 1 tablet (10 mg total) by mouth daily.  30 tablet 1   benztropine (COGENTIN) 0.5 MG tablet TAKE 1 TABLET(0.5 MG) BY MOUTH DAILY 90 tablet 0   divalproex (DEPAKOTE) 500 MG DR tablet TAKE 1 TABLET AT BEDTIME 90 tablet 0   paliperidone (INVEGA SUSTENNA) 156 MG/ML SUSY injection ADMINISTER 1 ML IN THE MUSCLE EVERY 30 DAYS 1 mL 1   Paliperidone ER (INVEGA SUSTENNA) injection Inject 156 mg into the muscle every 30 (thirty) days. 0.9 mL 1   rosuvastatin (CRESTOR) 5 MG tablet Take 1 tablet (5 mg total) by mouth daily. 30 tablet 1   Current Facility-Administered Medications   Medication Dose Route Frequency Provider Last Rate Last Admin   paliperidone (INVEGA SUSTENNA) injection 156 mg  156 mg Intramuscular Q30 days Lonie Peak, MD   156 mg at 07/06/21 1040      Psychiatric Specialty Exam: Review of Systems  Cardiovascular:  Negative for chest pain.  Psychiatric/Behavioral:  Negative for depression and substance abuse.    There were no vitals taken for this visit.There is no height or weight on file to calculate BMI.  General Appearance:   Eye Contact:   Speech:  Low   Volume:  Decreased  Mood: fair  Affect:   Thought Process:  Goal Directed  Orientation:  Full (Time, Place, and Person)  Thought Content:  Rumination  Suicidal Thoughts:  No  Homicidal Thoughts:  No  Memory:  Immediate;   Fair Recent;   Fair  Judgement:  Fair  Insight:  Fair  Psychomotor Activity:    Concentration:  Concentration: Fair and Attention Span: Fair  Recall:  [employer] of Knowledge:Fair  Language: Fair  Akathisia:    Handed:  Right  AIMS (if indicated): no involuntary movements  Assets:  Desire for Improvement Social Support  ADL's:  Intact  Cognition: WNL  Sleep:  fair  Past documentation copy reviewed  Treatment Plan Summary: Medication management and Plan as follows   Prior documentation reviewed  1. Bipolar per history: manic ;doing reasonable continue depakote and inj invega  2. Anxiety; manageable 3. Sleep:managing it fair  Fu 3 med reviewe, monthly for injection invega Reviewed side effects Thresa Ross MD

## 2021-08-01 ENCOUNTER — Other Ambulatory Visit (HOSPITAL_COMMUNITY): Payer: Self-pay | Admitting: Psychiatry

## 2021-08-08 ENCOUNTER — Encounter (HOSPITAL_COMMUNITY): Payer: Self-pay

## 2021-08-08 ENCOUNTER — Ambulatory Visit (INDEPENDENT_AMBULATORY_CARE_PROVIDER_SITE_OTHER): Payer: Medicare HMO

## 2021-08-08 VITALS — BP 130/88 | Temp 97.6°F | Ht 64.5 in | Wt 192.0 lb

## 2021-08-08 DIAGNOSIS — F310 Bipolar disorder, current episode hypomanic: Secondary | ICD-10-CM | POA: Diagnosis not present

## 2021-08-08 MED ORDER — PALIPERIDONE PALMITATE ER 156 MG/ML IM SUSY
156.0000 mg | PREFILLED_SYRINGE | Freq: Once | INTRAMUSCULAR | Status: AC
  Administered 2021-08-08: 156 mg via INTRAMUSCULAR

## 2021-08-08 NOTE — Progress Notes (Signed)
Patient came into office to receive Invega Sustenna 156mg /ml. Patient appeared stable. Patient states that injection site has been doing well. Patient stated she felt good, no pain or depression. She was prepared for her injection and wanted it to be in the left deltoid. Medication was administered to the patient in left deltoid. Patient stated no problems after injection and agreed to come back in 4 weeks for the next scheduled injection. No suicidal ideation or mania present.

## 2021-09-07 ENCOUNTER — Ambulatory Visit (INDEPENDENT_AMBULATORY_CARE_PROVIDER_SITE_OTHER): Payer: Medicare HMO

## 2021-09-07 ENCOUNTER — Encounter (HOSPITAL_COMMUNITY): Payer: Self-pay

## 2021-09-07 VITALS — BP 138/86 | Temp 97.6°F | Ht 64.5 in | Wt 191.0 lb

## 2021-09-07 DIAGNOSIS — F310 Bipolar disorder, current episode hypomanic: Secondary | ICD-10-CM

## 2021-09-07 MED ORDER — PALIPERIDONE PALMITATE ER 156 MG/ML IM SUSY
156.0000 mg | PREFILLED_SYRINGE | Freq: Once | INTRAMUSCULAR | Status: AC
  Administered 2021-09-07: 10:00:00 156 mg via INTRAMUSCULAR

## 2021-09-07 NOTE — Patient Instructions (Addendum)
Patient came into office to receive Invega Sustenna 156mg /ml. Patient appeared stable. Patient states that injection site has been doing well. Patient stated she felt good, no pain or depression. She was prepared for her injection and wanted it to be in the right deltoid. Medication was administered to the patient in right deltoid. Patient stated no problems after injection and agreed to come back in 4 weeks for the next scheduled injection. No suicidal ideation or mania present.

## 2021-09-12 ENCOUNTER — Ambulatory Visit (HOSPITAL_COMMUNITY): Payer: Medicare HMO | Admitting: Psychiatry

## 2021-09-21 ENCOUNTER — Other Ambulatory Visit (HOSPITAL_COMMUNITY): Payer: Self-pay | Admitting: Psychiatry

## 2021-09-27 ENCOUNTER — Other Ambulatory Visit (HOSPITAL_COMMUNITY): Payer: Self-pay | Admitting: Psychiatry

## 2021-10-09 ENCOUNTER — Other Ambulatory Visit (HOSPITAL_COMMUNITY): Payer: Self-pay

## 2021-10-09 ENCOUNTER — Ambulatory Visit (HOSPITAL_COMMUNITY): Payer: Medicare HMO | Admitting: Psychiatry

## 2021-10-09 MED ORDER — INVEGA SUSTENNA 156 MG/ML IM SUSY: PREFILLED_SYRINGE | INTRAMUSCULAR | 1 refills | Status: DC

## 2021-10-10 ENCOUNTER — Encounter (HOSPITAL_COMMUNITY): Payer: Self-pay

## 2021-10-10 ENCOUNTER — Ambulatory Visit (INDEPENDENT_AMBULATORY_CARE_PROVIDER_SITE_OTHER): Payer: Medicare HMO

## 2021-10-10 VITALS — BP 122/74 | Temp 98.0°F | Ht 64.5 in | Wt 190.0 lb

## 2021-10-10 DIAGNOSIS — F312 Bipolar disorder, current episode manic severe with psychotic features: Secondary | ICD-10-CM

## 2021-10-10 MED ORDER — PALIPERIDONE PALMITATE ER 156 MG/ML IM SUSY
156.0000 mg | PREFILLED_SYRINGE | Freq: Once | INTRAMUSCULAR | Status: AC
  Administered 2021-10-10: 156 mg via INTRAMUSCULAR

## 2021-10-10 NOTE — Progress Notes (Signed)
Patient came into office to receive Invega Sustenna 156mg /ml. Patient appeared stable. Patient states that injection site has been doing well. Patient stated she has been feeling well and no current  depression. She was prepared for her injection and wanted it to be in the left deltoid. Medication was administered to the patient in left deltoid. Patient stated no problems after injection and agreed to come back in 4 weeks for the next scheduled injection. No suicidal ideation or mania present.

## 2021-10-24 ENCOUNTER — Ambulatory Visit (HOSPITAL_COMMUNITY): Payer: Medicare HMO | Admitting: Psychiatry

## 2021-10-25 ENCOUNTER — Telehealth (HOSPITAL_COMMUNITY): Payer: Self-pay | Admitting: Psychiatry

## 2021-10-25 NOTE — Telephone Encounter (Signed)
Pt left vm stating she needs someone to call her about meds

## 2021-10-25 NOTE — Telephone Encounter (Signed)
Called patient back and she didn't answer. Left vm for her to call us back if she needs to. I also informed patient she missed her appt with Dr. Gilmore Laroche yesterday and she needs to reschedule it.

## 2021-10-30 ENCOUNTER — Encounter (HOSPITAL_COMMUNITY): Payer: Self-pay | Admitting: Psychiatry

## 2021-10-30 ENCOUNTER — Telehealth (INDEPENDENT_AMBULATORY_CARE_PROVIDER_SITE_OTHER): Payer: Medicare HMO | Admitting: Psychiatry

## 2021-10-30 DIAGNOSIS — G259 Extrapyramidal and movement disorder, unspecified: Secondary | ICD-10-CM | POA: Diagnosis not present

## 2021-10-30 DIAGNOSIS — F312 Bipolar disorder, current episode manic severe with psychotic features: Secondary | ICD-10-CM

## 2021-10-30 MED ORDER — INVEGA SUSTENNA 156 MG/ML IM SUSY: PREFILLED_SYRINGE | INTRAMUSCULAR | 1 refills | Status: DC

## 2021-10-30 NOTE — Progress Notes (Signed)
[facility] Outpatient Follow up visit  Patient Identification: Heather Marquez MRN:  [id number] Date of Evaluation:  10/30/2021 Referral Source: [facility] mental health Chief Complaint:    Fu Bipolar Visit Diagnosis:    ICD-10-CM   1. Severe manic bipolar 1 disorder with psychotic behavior (HCC)  F31.2     2. 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  Location: Patient: home Provider: home office   I discussed the limitations, risks, security and privacy concerns of performing an evaluation and management service by telephone and the availability of in person appointments. I also discussed with the patient that there may be a patient responsible charge related to this service. The patient expressed understanding and agreed to proceed.      I discussed the assessment and treatment plan with the patient. The patient was provided an opportunity to ask questions and all were answered. The patient agreed with the plan and demonstrated an understanding of the instructions.   The patient was advised to call back or seek an in-person evaluation if the symptoms worsen or if the condition fails to improve as anticipated.  I provided 15 minutes of non-face-to-face time during this encounter.    History of Present Illness:  61   years old currently married African-American female initially referred by [facility] committee mental healthcare after her relocation here.  Doing fair gets her invega shot monthly Works PT handling an elderly   Mood stable  Aggravating factor: non compliance history Modifying factor: husband   Past Psychiatric History: bipolar, manic as per admission history  Previous Psychotropic Medications: Yes     Family History: History reviewed. No pertinent family history.  Social History:    Social History   Socioeconomic History   Marital status: Married    Spouse name: Not on file   Number of children: Not on file   Years of education: Not on file   Highest education level: Not on file  Occupational History   Not on file  Tobacco Use   Smoking status: Never   Smokeless tobacco: Never  Vaping Use   Vaping Use: Never used  Substance and Sexual Activity   Alcohol use: No   Drug use: No   Sexual activity: Yes    Partners: Male  Other Topics Concern   Not on file  Social History Narrative   Not on file   Social Determinants of Health   Financial Resource Strain: Not on file  Food Insecurity: Not on file  Transportation Needs: Not on file  Physical Activity: Not on file  Stress: Not on file  Social Connections: Not on file      Allergies:   Allergies  Allergen Reactions   Mango Flavor Rash    Metabolic Disorder Labs: Lab Results  Component Value Date   HGBA1C 5.6 10/01/2019   MPG 114.02 10/01/2019   No results found for: PROLACTIN Lab Results  Component Value Date   CHOL 160 10/01/2019   TRIG 46 10/01/2019   HDL 71 10/01/2019   CHOLHDL 2.3 10/01/2019   VLDL 9 10/01/2019   LDLCALC 80 10/01/2019     Current Medications: Current Outpatient Medications  Medication Sig Dispense Refill   amLODipine (NORVASC) 10 MG tablet Take 1 tablet (10 mg total) by mouth daily. 30 tablet 1  benztropine (COGENTIN) 0.5 MG tablet TAKE 1 TABLET(0.5 MG) BY MOUTH DAILY 90 tablet 0   divalproex (DEPAKOTE) 500 MG DR tablet TAKE 1 TABLET AT BEDTIME 90 tablet 0   paliperidone (INVEGA SUSTENNA) 156 MG/ML SUSY injection Administer in the muscle every 30 days 1 mL 1   rosuvastatin (CRESTOR) 5 MG tablet Take 1 tablet (5 mg total) by mouth daily. 30 tablet 1   Current Facility-Administered Medications  Medication Dose Route Frequency Provider Last Rate Last Admin   paliperidone (INVEGA SUSTENNA) injection 156 mg  156 mg Intramuscular Q30 days Lonie Peak,  MD   156 mg at 07/06/21 1040      Psychiatric Specialty Exam: Review of Systems  Cardiovascular:  Negative for chest pain.  Psychiatric/Behavioral:  Negative for depression and substance abuse.    There were no vitals taken for this visit.There is no height or weight on file to calculate BMI.  General Appearance:   Eye Contact:   Speech:  Low   Volume:  Decreased  Mood: fair  Affect:   Thought Process:  Goal Directed  Orientation:  Full (Time, Place, and Person)  Thought Content:  Rumination  Suicidal Thoughts:  No  Homicidal Thoughts:  No  Memory:  Immediate;   Fair Recent;   Fair  Judgement:  Fair  Insight:  Fair  Psychomotor Activity:    Concentration:  Concentration: Fair and Attention Span: Fair  Recall:  [employer] of Knowledge:Fair  Language: Fair  Akathisia:    Handed:  Right  AIMS (if indicated): no involuntary movements  Assets:  Desire for Improvement Social Support  ADL's:  Intact  Cognition: WNL  Sleep:  fair  Past documentation copy reviewed  Treatment Plan Summary: Medication management and Plan as follows    Prior documentation reviewed   1. Bipolar per history: manic ;doing stable continue injection invega 2. Anxiety; manageable, continue to work on distractions 3. Sleep:managing it fair 4. EPS manageable by cogentine, will continue Fu 34m   Thresa Ross MD

## 2021-11-07 ENCOUNTER — Ambulatory Visit (INDEPENDENT_AMBULATORY_CARE_PROVIDER_SITE_OTHER): Payer: Medicare HMO

## 2021-11-07 ENCOUNTER — Encounter (HOSPITAL_COMMUNITY): Payer: Self-pay

## 2021-11-07 VITALS — BP 108/76 | Temp 98.3°F | Ht 64.0 in | Wt 191.0 lb

## 2021-11-07 DIAGNOSIS — F312 Bipolar disorder, current episode manic severe with psychotic features: Secondary | ICD-10-CM | POA: Diagnosis not present

## 2021-11-07 MED ORDER — PALIPERIDONE PALMITATE ER 156 MG/ML IM SUSY
156.0000 mg | PREFILLED_SYRINGE | Freq: Once | INTRAMUSCULAR | Status: AC
Start: 2021-11-07 — End: 2021-11-07
  Administered 2021-11-07: 156 mg via INTRAMUSCULAR

## 2021-11-07 NOTE — Patient Instructions (Signed)
Patient came into office to receive Invega Sustenna 156mg /ml. Patient appeared stable. Patient states that injection site has been doing well. Patient stated she has been feeling well and no current  depression. Patient has been trying to lose weight. She was prepared for her injection and wanted it to be in the right deltoid. Medication was administered to the patient in right deltoid. Patient stated no problems after injection and agreed to come back in 4 weeks for the next scheduled injection. No suicidal ideation or mania present.

## 2021-12-04 ENCOUNTER — Ambulatory Visit (INDEPENDENT_AMBULATORY_CARE_PROVIDER_SITE_OTHER): Payer: Medicare HMO

## 2021-12-04 ENCOUNTER — Encounter (HOSPITAL_COMMUNITY): Payer: Self-pay | Admitting: Psychiatry

## 2021-12-04 VITALS — BP 122/72 | Temp 98.0°F | Ht 64.5 in | Wt 190.0 lb

## 2021-12-04 DIAGNOSIS — F312 Bipolar disorder, current episode manic severe with psychotic features: Secondary | ICD-10-CM

## 2021-12-04 MED ORDER — PALIPERIDONE PALMITATE ER 156 MG/ML IM SUSY
156.0000 mg | PREFILLED_SYRINGE | Freq: Once | INTRAMUSCULAR | Status: AC
Start: 2021-12-04 — End: 2021-12-04
  Administered 2021-12-04: 156 mg via INTRAMUSCULAR

## 2021-12-04 NOTE — Progress Notes (Signed)
Patient came into office to receive Invega Sustenna 156mg /ml. Patient appeared stable.  Patient stated she has been feeling well and no current  depression. Patient says she has still been trying to lose weight. She was prepared for her injection and wanted it to be in the left deltoid. Medication was administered to the patient in left deltoid after vitals were done. Patient stated no problems after injection and agreed to come back in 4 weeks for the next scheduled injection. No suicidal ideation or mania present.  ?

## 2021-12-20 ENCOUNTER — Other Ambulatory Visit (HOSPITAL_COMMUNITY): Payer: Self-pay

## 2021-12-20 MED ORDER — BENZTROPINE MESYLATE 0.5 MG PO TABS: ORAL_TABLET | ORAL | 0 refills | Status: DC

## 2022-01-01 ENCOUNTER — Telehealth (HOSPITAL_COMMUNITY): Payer: Self-pay

## 2022-01-01 MED ORDER — INVEGA SUSTENNA 156 MG/ML IM SUSY: PREFILLED_SYRINGE | INTRAMUSCULAR | 1 refills | Status: DC

## 2022-01-01 NOTE — Telephone Encounter (Signed)
Medication refill - Fax received from pt's [facility] that she is in need of a new Paliperidone 156mg /ml order, last prescribed 10/30/21 plus 1 refill and last picked up 12/03/21. Pt is scheduled for next injection with nurse on 01/02/22 and needs a new order as she does not return to see provider until 02/01/22.  ?

## 2022-01-02 ENCOUNTER — Ambulatory Visit (HOSPITAL_COMMUNITY): Payer: Medicare HMO | Admitting: Psychiatry

## 2022-01-03 ENCOUNTER — Encounter (HOSPITAL_COMMUNITY): Payer: Self-pay

## 2022-01-03 ENCOUNTER — Ambulatory Visit (INDEPENDENT_AMBULATORY_CARE_PROVIDER_SITE_OTHER): Payer: Medicare HMO

## 2022-01-03 VITALS — BP 128/70 | Ht 64.0 in | Wt 191.0 lb

## 2022-01-03 DIAGNOSIS — F312 Bipolar disorder, current episode manic severe with psychotic features: Secondary | ICD-10-CM

## 2022-01-03 MED ORDER — PALIPERIDONE PALMITATE ER 156 MG/ML IM SUSY
156.0000 mg | PREFILLED_SYRINGE | Freq: Once | INTRAMUSCULAR | Status: AC
Start: 2022-01-03 — End: 2022-01-03
  Administered 2022-01-03: 156 mg via INTRAMUSCULAR

## 2022-01-03 NOTE — Progress Notes (Signed)
Patient came into office to receive Invega Sustenna 156mg /ml. Patient was stable.  Patient has been feeling well and no current  depression. Patient says she gained a pound since last visit. She was prepared for her injection and wanted it to be in the right deltoid. Medication was administered to the patient in right deltoid after vitals were done. Patient stated no problems after injection and agreed to come back in 4 weeks for the next scheduled injection. No suicidal ideation or mania present.  ?

## 2022-01-03 NOTE — Addendum Note (Signed)
Addended by: Azalia Bilis on: 01/03/2022 10:18 AM ? ? Modules accepted: Level of Service ? ?

## 2022-01-03 NOTE — Patient Instructions (Signed)
Return in 4 weeks for next injection or concerns ?

## 2022-01-30 ENCOUNTER — Ambulatory Visit (HOSPITAL_COMMUNITY): Payer: Medicare HMO | Admitting: Psychiatry

## 2022-01-31 ENCOUNTER — Ambulatory Visit (HOSPITAL_COMMUNITY): Payer: Medicare HMO | Admitting: Psychiatry

## 2022-02-01 ENCOUNTER — Encounter (HOSPITAL_COMMUNITY): Payer: Self-pay | Admitting: Psychiatry

## 2022-02-01 ENCOUNTER — Ambulatory Visit (HOSPITAL_COMMUNITY): Payer: Medicare HMO | Admitting: Psychiatry

## 2022-02-01 ENCOUNTER — Telehealth (INDEPENDENT_AMBULATORY_CARE_PROVIDER_SITE_OTHER): Payer: Medicare HMO | Admitting: Psychiatry

## 2022-02-01 DIAGNOSIS — F310 Bipolar disorder, current episode hypomanic: Secondary | ICD-10-CM | POA: Diagnosis not present

## 2022-02-01 DIAGNOSIS — G259 Extrapyramidal and movement disorder, unspecified: Secondary | ICD-10-CM | POA: Diagnosis not present

## 2022-02-01 DIAGNOSIS — F312 Bipolar disorder, current episode manic severe with psychotic features: Secondary | ICD-10-CM

## 2022-02-01 MED ORDER — BENZTROPINE MESYLATE 0.5 MG PO TABS: ORAL_TABLET | ORAL | 0 refills | Status: DC

## 2022-02-01 MED ORDER — DIVALPROEX SODIUM 500 MG PO DR TAB
500.0000 mg | DELAYED_RELEASE_TABLET | Freq: Every day | ORAL | 0 refills | Status: DC
Start: 2022-02-01 — End: 2022-04-16

## 2022-02-01 NOTE — Progress Notes (Signed)
[facility] Outpatient Follow up visit  Patient Identification: Heather Marquez MRN:  [id number] Date of Evaluation:  02/01/2022 Referral Source: [facility] mental health Chief Complaint:    Fu Bipolar Visit Diagnosis:    ICD-10-CM   1. Severe manic bipolar 1 disorder with psychotic behavior (HCC)  F31.2     2. 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 Bipolar disorder, current episode hypomanic (HCC)  F31.0      Virtual Visit via Telephone Note  I connected with Heather Marquez on 02/01/22 at  3:00 PM EDT by telephone and verified that I am speaking with the correct person using two identifiers.  Location: Patient: home Provider: office   I discussed the limitations, risks, security and privacy concerns of performing an evaluation and management service by telephone and the availability of in person appointments. I also discussed with the patient that there may be a patient responsible charge related to this service. The patient expressed understanding and agreed to proceed.      I discussed the assessment and treatment plan with the patient. The patient was provided an opportunity to ask questions and all were answered. The patient agreed with the plan and demonstrated an understanding of the instructions.   The patient was advised to call back or seek an in-person evaluation if the symptoms worsen or if the condition fails to improve as anticipated.  I provided 15 minutes of non-face-to-face time during this encounter.    History of Present Illness:  61   years old currently married African-American female initially referred by [facility] committee mental healthcare after her relocation here.  Continues to do fair and not decompensated with injection invega monthly due tomorrow Husband is supportive Cogentin helps with tremors or EPS not worse   Mood stable  Aggravating factor: non compliance history Modifying factor: husband   Past Psychiatric History: bipolar, manic as per  admission history  Previous Psychotropic Medications: Yes     Family History: No family history on file.  Social History:   Social History   Socioeconomic History   Marital status: Married    Spouse name: Not on file   Number of children: Not on file   Years of education: Not on file   Highest education level: Not on file  Occupational History   Not on file  Tobacco Use   Smoking status: Never   Smokeless tobacco: Never  Vaping Use   Vaping Use: Never used  Substance and Sexual Activity   Alcohol use: No   Drug use: No   Sexual activity: Yes    Partners: Male  Other Topics Concern   Not on file  Social History Narrative   Not on file   Social Determinants of Health   Financial Resource Strain: Not on file  Food Insecurity: Not on file  Transportation Needs: Not on file  Physical Activity: Not on file  Stress: Not on file  Social Connections: Not on file      Allergies:   Allergies  Allergen Reactions   Mango Flavor Rash    Metabolic Disorder Labs: Lab Results  Component Value Date   HGBA1C 5.6 10/01/2019   MPG 114.02 10/01/2019   No results found for: PROLACTIN Lab Results  Component Value Date   CHOL 160 10/01/2019   TRIG 46 10/01/2019   HDL 71 10/01/2019   CHOLHDL 2.3 10/01/2019   VLDL 9 10/01/2019   LDLCALC 80 10/01/2019     Current Medications: Current Outpatient Medications  Medication Sig  Dispense Refill   amLODipine (NORVASC) 10 MG tablet Take 1 tablet (10 mg total) by mouth daily. 30 tablet 1   benztropine (COGENTIN) 0.5 MG tablet TAKE 1 TABLET(0.5 MG) BY MOUTH DAILY 90 tablet 0   divalproex (DEPAKOTE) 500 MG DR tablet Take 1 tablet (500 mg total) by mouth at bedtime. 90 tablet 0   paliperidone (INVEGA SUSTENNA) 156 MG/ML SUSY injection Administer in the muscle every 30 days 1 mL 1   rosuvastatin (CRESTOR) 5 MG tablet Take 1 tablet (5 mg total) by mouth daily. 30 tablet 1   Current Facility-Administered Medications   Medication Dose Route Frequency Provider Last Rate Last Admin   paliperidone (INVEGA SUSTENNA) injection 156 mg  156 mg Intramuscular Q30 days Lonie Peak, MD   156 mg at 07/06/21 1040      Psychiatric Specialty Exam: Review of Systems  Cardiovascular:  Negative for chest pain.  Psychiatric/Behavioral:  Negative for depression and substance abuse.    There were no vitals taken for this visit.There is no height or weight on file to calculate BMI.  General Appearance:   Eye Contact:   Speech:  Low   Volume:  Decreased  Mood: fair  Affect:   Thought Process:  Goal Directed  Orientation:  Full (Time, Place, and Person)  Thought Content:  Rumination  Suicidal Thoughts:  No  Homicidal Thoughts:  No  Memory:  Immediate;   Fair Recent;   Fair  Judgement:  Fair  Insight:  Fair  Psychomotor Activity:    Concentration:  Concentration: Fair and Attention Span: Fair  Recall:  [employer] of Knowledge:Fair  Language: Fair  Akathisia:    Handed:  Right  AIMS (if indicated): no involuntary movements  Assets:  Desire for Improvement Social Support  ADL's:  Intact  Cognition: WNL  Sleep:  fair  Past documentation copy reviewed  Treatment Plan Summary: Medication management and Plan as follows    Prior documentation reviewed   1. Bipolar per history: manic ;remains stable or baseline, continue invega injection and depakote 2. Anxiety; manageable, continue to work on distractions 3. Sleep:managing it fair 4. EPS doing better with cogentin, continue one daily  Reviewed meds and refills due were sent  Fu 37m for med eval [place] for injection invega Thresa Ross MD

## 2022-02-02 ENCOUNTER — Ambulatory Visit (INDEPENDENT_AMBULATORY_CARE_PROVIDER_SITE_OTHER): Payer: Medicare HMO

## 2022-02-02 ENCOUNTER — Encounter (HOSPITAL_COMMUNITY): Payer: Self-pay

## 2022-02-02 VITALS — BP 118/76 | Ht 64.5 in | Wt 194.0 lb

## 2022-02-02 DIAGNOSIS — F312 Bipolar disorder, current episode manic severe with psychotic features: Secondary | ICD-10-CM

## 2022-02-02 MED ORDER — PALIPERIDONE PALMITATE ER 156 MG/ML IM SUSY
156.0000 mg | PREFILLED_SYRINGE | Freq: Once | INTRAMUSCULAR | Status: AC
Start: 2022-02-02 — End: 2022-02-02
  Administered 2022-02-02: 156 mg via INTRAMUSCULAR

## 2022-02-02 NOTE — Patient Instructions (Addendum)
Patient came into office to receive Invega Sustenna 156mg /ml. Patient was stable.  Patient has been feeling well and no current  depression. She noted that she gained 3 pounds since last injection.  She was prepared for her injection and wanted it to be in the left deltoid. Medication was administered to the patient in left deltoid after vitals were done. Patient stated no problems after injection and agreed to come back in 4 weeks for the next scheduled injection. No suicidal ideation or mania present

## 2022-02-28 ENCOUNTER — Other Ambulatory Visit (HOSPITAL_COMMUNITY): Payer: Self-pay | Admitting: Psychiatry

## 2022-03-02 ENCOUNTER — Other Ambulatory Visit (HOSPITAL_COMMUNITY): Payer: Self-pay | Admitting: Psychiatry

## 2022-03-07 ENCOUNTER — Encounter (HOSPITAL_COMMUNITY): Payer: Self-pay

## 2022-03-07 ENCOUNTER — Ambulatory Visit (INDEPENDENT_AMBULATORY_CARE_PROVIDER_SITE_OTHER): Payer: Medicare HMO

## 2022-03-07 VITALS — BP 120/70 | Temp 98.6°F | Ht 64.0 in | Wt 190.0 lb

## 2022-03-07 DIAGNOSIS — F312 Bipolar disorder, current episode manic severe with psychotic features: Secondary | ICD-10-CM

## 2022-03-07 MED ORDER — PALIPERIDONE PALMITATE ER 156 MG/ML IM SUSY
156.0000 mg | PREFILLED_SYRINGE | Freq: Once | INTRAMUSCULAR | Status: AC
  Administered 2022-03-07: 156 mg via INTRAMUSCULAR

## 2022-03-07 NOTE — Progress Notes (Signed)
Patient came into office to receive Invega Sustenna 156mg /ml. Patient was stable.  Patient has been feeling well and no current  depression. She noted that she lost 2 pounds since last injection.  She was prepared for her injection and wanted it to be in the right deltoid. Medication was administered to the patient in right deltoid after vitals were done. Patient stated no problems after injection and agreed to come back in 4 weeks for the next scheduled injection. No suicidal ideation or mania present

## 2022-03-07 NOTE — Patient Instructions (Signed)
Return in around 4 weeks for next injection

## 2022-04-03 ENCOUNTER — Ambulatory Visit (HOSPITAL_COMMUNITY): Payer: Medicare HMO | Admitting: Psychiatry

## 2022-04-04 ENCOUNTER — Ambulatory Visit (HOSPITAL_COMMUNITY): Payer: Medicare HMO | Admitting: Psychiatry

## 2022-04-05 ENCOUNTER — Encounter (HOSPITAL_COMMUNITY): Payer: Self-pay

## 2022-04-05 ENCOUNTER — Ambulatory Visit (INDEPENDENT_AMBULATORY_CARE_PROVIDER_SITE_OTHER): Payer: Medicare HMO

## 2022-04-05 VITALS — BP 110/78 | Temp 98.6°F | Ht 64.0 in | Wt 187.0 lb

## 2022-04-05 DIAGNOSIS — F312 Bipolar disorder, current episode manic severe with psychotic features: Secondary | ICD-10-CM | POA: Diagnosis not present

## 2022-04-05 MED ORDER — PALIPERIDONE PALMITATE ER 156 MG/ML IM SUSY
156.0000 mg | PREFILLED_SYRINGE | Freq: Once | INTRAMUSCULAR | Status: AC
  Administered 2022-04-05: 156 mg via INTRAMUSCULAR

## 2022-04-05 NOTE — Progress Notes (Signed)
   Established Patient Office Visit  Subjective   Patient ID: Heather Marquez, female    DOB: 14-Apr-1961  Age: 61 y.o. MRN: [id number]  Chief Complaint  Patient presents with   Other    Monthly invega injection    HPI    ROS    Objective:     BP 110/78 (BP Location: Left Arm, Patient Position: Sitting, Cuff Size: Normal)   Temp 98.6 F (37 C)   Ht 5\' 4"  (1.626 m)   Wt 187 lb (84.8 kg)   BMI 32.10 kg/m    Physical Exam   No results found for any visits on 04/05/22.    The 10-year ASCVD risk score (Arnett DK, et al., 2019) is: 6.7%    Assessment & Plan:   Problem List Items Addressed This Visit   None   No follow-ups on file.    2020, Azalia Bilis

## 2022-04-05 NOTE — Progress Notes (Signed)
Patient came into office to receive Invega Sustenna 156mg /ml. Patient was stable.  Patient has been feeling well and no current  depression. She noted that she lost 3 pounds since last injection and was excited about that.  She was prepared for her injection and wanted it to be in the left deltoid. Medication was administered to the patient in left deltoid after vitals were done. Patient stated no problems after injection and agreed to come back in 4 weeks for the next scheduled injection. No suicidal ideation or mania present

## 2022-04-16 ENCOUNTER — Other Ambulatory Visit (HOSPITAL_COMMUNITY): Payer: Self-pay | Admitting: Psychiatry

## 2022-04-27 NOTE — Telephone Encounter (Signed)
Opened in error

## 2022-04-30 ENCOUNTER — Other Ambulatory Visit (HOSPITAL_COMMUNITY): Payer: Self-pay | Admitting: Psychiatry

## 2022-05-06 ENCOUNTER — Other Ambulatory Visit (HOSPITAL_COMMUNITY): Payer: Self-pay | Admitting: Psychiatry

## 2022-05-08 ENCOUNTER — Encounter (HOSPITAL_COMMUNITY): Payer: Self-pay

## 2022-05-08 ENCOUNTER — Ambulatory Visit (INDEPENDENT_AMBULATORY_CARE_PROVIDER_SITE_OTHER): Payer: Medicare HMO

## 2022-05-08 ENCOUNTER — Ambulatory Visit (HOSPITAL_COMMUNITY): Payer: Medicare HMO | Admitting: Psychiatry

## 2022-05-08 VITALS — BP 120/68 | Ht 64.0 in | Wt 194.0 lb

## 2022-05-08 DIAGNOSIS — F312 Bipolar disorder, current episode manic severe with psychotic features: Secondary | ICD-10-CM

## 2022-05-08 MED ORDER — PALIPERIDONE PALMITATE ER 156 MG/ML IM SUSY
156.0000 mg | PREFILLED_SYRINGE | Freq: Once | INTRAMUSCULAR | Status: AC
  Administered 2022-05-08: 156 mg via INTRAMUSCULAR

## 2022-05-08 NOTE — Patient Instructions (Signed)
Return in about 4 weeks

## 2022-05-10 ENCOUNTER — Encounter (HOSPITAL_COMMUNITY): Payer: Self-pay | Admitting: Psychiatry

## 2022-05-10 ENCOUNTER — Telehealth (INDEPENDENT_AMBULATORY_CARE_PROVIDER_SITE_OTHER): Payer: Medicare HMO | Admitting: Psychiatry

## 2022-05-10 ENCOUNTER — Ambulatory Visit (HOSPITAL_COMMUNITY): Payer: Medicare HMO | Admitting: Psychiatry

## 2022-05-10 DIAGNOSIS — F3110 Bipolar disorder, current episode manic without psychotic features, unspecified: Secondary | ICD-10-CM | POA: Diagnosis not present

## 2022-05-10 DIAGNOSIS — G259 Extrapyramidal and movement disorder, unspecified: Secondary | ICD-10-CM

## 2022-05-10 DIAGNOSIS — F312 Bipolar disorder, current episode manic severe with psychotic features: Secondary | ICD-10-CM

## 2022-05-10 NOTE — Progress Notes (Addendum)
[facility] Outpatient Follow up visit  Patient Identification: Erykah Lippert MRN:  [id number] Date of Evaluation:  05/10/2022 Referral Source: [facility] mental health Chief Complaint:    Fu Bipolar Visit Diagnosis:    ICD-10-CM   1. Severe manic bipolar 1 disorder with psychotic behavior (HCC)  F31.2     2. 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  Location: Patient: car with husband Provider: office I discussed the limitations, risks, security and privacy concerns of performing an evaluation and management service by telephone and the availability of in person appointments. I also discussed with the patient that there may be a patient responsible charge related to this service. The patient expressed understanding and agreed to proceed.      I discussed the assessment and treatment plan with the patient. The patient was provided an opportunity to ask questions and all were answered. The patient agreed with the plan and demonstrated an understanding of the instructions.   The patient was advised to call back or seek an in-person evaluation if the symptoms worsen or if the condition fails to improve as anticipated.  I provided 15  minutes of non-face-to-face time during this encounter.    History of Present Illness:  61   years old currently married African-American female initially referred by [facility] committee mental healthcare after her relocation here.  Remains stable and tolerating meds and injection invega has kept her stable Husband also states she is doing fine, no pranoia No side effects reported    Mood stable  Aggravating factor: non compliance history Modifying factor: husband   Past Psychiatric History: bipolar, manic as per admission  history  Previous Psychotropic Medications: Yes     Family History: History reviewed. No pertinent family history.  Social History:   Social History   Socioeconomic History   Marital status: Married    Spouse name: Not on file   Number of children: Not on file   Years of education: Not on file   Highest education level: Not on file  Occupational History   Not on file  Tobacco Use   Smoking status: Never   Smokeless tobacco: Never  Vaping Use   Vaping Use: Never used  Substance and Sexual Activity   Alcohol use: No   Drug use: No   Sexual activity: Yes    Partners: Male  Other Topics Concern   Not on file  Social History Narrative   Not on file   Social Determinants of Health   Financial Resource Strain: Not on file  Food Insecurity: Not on file  Transportation Needs: Not on file  Physical Activity: Not on file  Stress: Not on file  Social Connections: Not on file      Allergies:   Allergies  Allergen Reactions   Mango Flavor Rash    Metabolic Disorder Labs: Lab Results  Component Value Date   HGBA1C 5.6 10/01/2019   MPG 114.02 10/01/2019   No results found for: "PROLACTIN" Lab Results  Component Value Date   CHOL 160 10/01/2019   TRIG 46 10/01/2019   HDL 71 10/01/2019   CHOLHDL 2.3 10/01/2019   VLDL 9 10/01/2019   LDLCALC 80 10/01/2019     Current Medications: Current Outpatient Medications  Medication Sig Dispense  Refill   amLODipine (NORVASC) 10 MG tablet Take 1 tablet (10 mg total) by mouth daily. 30 tablet 1   benztropine (COGENTIN) 0.5 MG tablet TAKE 1 TABLET EVERY DAY 90 tablet 0   divalproex (DEPAKOTE) 500 MG DR tablet TAKE 1 TABLET AT BEDTIME 90 tablet 0   INVEGA SUSTENNA 156 MG/ML SUSY injection ADMINISTER 1 ML IN THE MUSCLE EVERY 30 DAYS 1 mL 1   rosuvastatin (CRESTOR) 5 MG tablet Take 1 tablet (5 mg total) by mouth daily. 30 tablet 1   Current Facility-Administered Medications  Medication Dose Route Frequency Provider Last  Rate Last Admin   paliperidone (INVEGA SUSTENNA) injection 156 mg  156 mg Intramuscular Q30 days Lonie Peak, MD   156 mg at 07/06/21 1040      Psychiatric Specialty Exam: Review of Systems  Psychiatric/Behavioral:  Negative for depression and substance abuse.     There were no vitals taken for this visit.There is no height or weight on file to calculate BMI.  General Appearance:   Eye Contact:   Speech:  Low   Volume:  Decreased  Mood: fair  Affect:   Thought Process:  Goal Directed  Orientation:  Full (Time, Place, and Person)  Thought Content:  Rumination  Suicidal Thoughts:  No  Homicidal Thoughts:  No  Memory:  Immediate;   Fair Recent;   Fair  Judgement:  Fair  Insight:  Fair  Psychomotor Activity:    Concentration:  Concentration: Fair and Attention Span: Fair  Recall:  [employer] of Knowledge:Fair  Language: Fair  Akathisia:    Handed:  Right  AIMS (if indicated): no involuntary movements  Assets:  Desire for Improvement Social Support  ADL's:  Intact  Cognition: WNL  Sleep:  fair  Past documentation copy reviewed  Treatment Plan Summary: Medication management and Plan as follows    Prior documentation reviewed   1. Bipolar per history: manic ; stable continue inj invega and depakote 2. Anxiety; manageable, continue to work on distractions 3. Sleep:managing it fair 4. EPS ; manageable on cogentin  Fu 25m. Continue monthly invega injection Thresa Ross MD

## 2022-06-05 ENCOUNTER — Ambulatory Visit (HOSPITAL_COMMUNITY): Payer: Medicare HMO | Admitting: Psychiatry

## 2022-06-06 ENCOUNTER — Encounter (HOSPITAL_COMMUNITY): Payer: Self-pay

## 2022-06-06 ENCOUNTER — Ambulatory Visit (INDEPENDENT_AMBULATORY_CARE_PROVIDER_SITE_OTHER): Payer: Medicare HMO

## 2022-06-06 VITALS — BP 118/78 | Temp 98.6°F | Ht 64.0 in | Wt 195.0 lb

## 2022-06-06 DIAGNOSIS — F312 Bipolar disorder, current episode manic severe with psychotic features: Secondary | ICD-10-CM

## 2022-06-06 MED ORDER — PALIPERIDONE PALMITATE ER 156 MG/ML IM SUSY
156.0000 mg | PREFILLED_SYRINGE | Freq: Once | INTRAMUSCULAR | Status: AC
  Administered 2022-06-06: 156 mg via INTRAMUSCULAR

## 2022-06-06 NOTE — Progress Notes (Addendum)
Patient came into office to receive Invega Sustenna 156mg /ml. Patient was stable.  Patient has been feeling well and no current  depression. She noted that she gained 1 pound since last injection and was not excited about that.  She was prepared for her injection and wanted it to be in the left deltoid. Medication was administered to the patient in left deltoid after vitals were done. Patient stated no problems after injection and agreed to come back in 4 weeks for the next scheduled injection. No suicidal ideation or mania present   Above notes from [place] reviewed .

## 2022-07-02 ENCOUNTER — Other Ambulatory Visit (HOSPITAL_COMMUNITY): Payer: Self-pay

## 2022-07-02 MED ORDER — INVEGA SUSTENNA 156 MG/ML IM SUSY: PREFILLED_SYRINGE | INTRAMUSCULAR | 1 refills | Status: DC

## 2022-07-03 ENCOUNTER — Ambulatory Visit (HOSPITAL_COMMUNITY): Payer: Medicare HMO | Admitting: Psychiatry

## 2022-07-05 ENCOUNTER — Ambulatory Visit (INDEPENDENT_AMBULATORY_CARE_PROVIDER_SITE_OTHER): Payer: Medicare HMO

## 2022-07-05 ENCOUNTER — Encounter (HOSPITAL_COMMUNITY): Payer: Self-pay

## 2022-07-05 VITALS — BP 106/68 | HR 80 | Temp 98.6°F | Ht 64.0 in | Wt 195.0 lb

## 2022-07-05 DIAGNOSIS — F312 Bipolar disorder, current episode manic severe with psychotic features: Secondary | ICD-10-CM

## 2022-07-05 MED ORDER — PALIPERIDONE PALMITATE ER 156 MG/ML IM SUSY
156.0000 mg | PREFILLED_SYRINGE | Freq: Once | INTRAMUSCULAR | Status: AC
  Administered 2022-07-05: 156 mg via INTRAMUSCULAR

## 2022-07-05 NOTE — Patient Instructions (Signed)
Return in around 4 weeks

## 2022-07-31 ENCOUNTER — Encounter (HOSPITAL_COMMUNITY): Payer: Self-pay

## 2022-07-31 ENCOUNTER — Ambulatory Visit (INDEPENDENT_AMBULATORY_CARE_PROVIDER_SITE_OTHER): Payer: Medicare HMO

## 2022-07-31 VITALS — BP 118/78 | HR 78 | Temp 98.8°F | Ht 64.0 in | Wt 196.0 lb

## 2022-07-31 DIAGNOSIS — F312 Bipolar disorder, current episode manic severe with psychotic features: Secondary | ICD-10-CM

## 2022-07-31 MED ORDER — PALIPERIDONE PALMITATE ER 156 MG/ML IM SUSY
156.0000 mg | PREFILLED_SYRINGE | Freq: Once | INTRAMUSCULAR | Status: AC
  Administered 2022-07-31: 156 mg via INTRAMUSCULAR

## 2022-07-31 NOTE — Progress Notes (Signed)
Patient came into office to receive Invega Sustenna 156mg /ml. Patient was stable.  Patient has been feeling well and no current  depression. She noted that she gained 1 pound since last injection and was not excited about that.  She was prepared for her injection and wanted it to be in the left deltoid. Medication was administered to the patient in left deltoid after vitals were done. Patient stated no problems after injection and agreed to come back in 4 weeks for the next scheduled injection. No suicidal ideation or mania present

## 2022-08-14 ENCOUNTER — Ambulatory Visit (HOSPITAL_COMMUNITY): Payer: Medicare HMO | Admitting: Psychiatry

## 2022-08-27 ENCOUNTER — Telehealth (HOSPITAL_COMMUNITY): Payer: Self-pay | Admitting: *Deleted

## 2022-08-27 MED ORDER — INVEGA SUSTENNA 156 MG/ML IM SUSY: PREFILLED_SYRINGE | INTRAMUSCULAR | 1 refills | Status: DC

## 2022-08-27 NOTE — Telephone Encounter (Signed)
Patient called stating she do not do any business with [facility] anymore and would like for her Heather Marquez to please go to [place] off [address] in [place].  Please resend patient Invega 156mg /mL to 

## 2022-08-27 NOTE — Addendum Note (Signed)
Addended by: Thresa Ross on: 08/27/2022 01:42 PM   Modules accepted: Orders

## 2022-08-27 NOTE — Telephone Encounter (Signed)
Patient pharmacy ([facility] in [place] off [address] [place]) requesting refills for patient Heather Marquez 156mg /ML injection  This medication was last refills on 04/04/2021  Patient last follow up was 05/10/2022 Patient f/u is 09/11/2022   Patient next injection is scheduled for 08/28/2022.

## 2022-08-27 NOTE — Addendum Note (Signed)
Addended by: Thresa Ross on: 08/27/2022 04:44 PM   Modules accepted: Orders

## 2022-08-28 ENCOUNTER — Ambulatory Visit (HOSPITAL_COMMUNITY): Payer: Medicare HMO | Admitting: Psychiatry

## 2022-08-30 ENCOUNTER — Encounter (HOSPITAL_COMMUNITY): Payer: Self-pay | Admitting: Psychiatry

## 2022-08-30 ENCOUNTER — Ambulatory Visit (INDEPENDENT_AMBULATORY_CARE_PROVIDER_SITE_OTHER): Payer: Medicare HMO | Admitting: *Deleted

## 2022-08-30 VITALS — BP 123/80 | HR 84 | Ht 64.0 in | Wt 194.6 lb

## 2022-08-30 DIAGNOSIS — F310 Bipolar disorder, current episode hypomanic: Secondary | ICD-10-CM

## 2022-08-30 DIAGNOSIS — F312 Bipolar disorder, current episode manic severe with psychotic features: Secondary | ICD-10-CM

## 2022-08-30 NOTE — Patient Instructions (Signed)
Follow-up in 4 weeks

## 2022-08-30 NOTE — Progress Notes (Signed)
Injection given in patient Right Deltoid  Patient did not have any C/O after last injection  Patient excited that she's been losing weight. Patient previous weight was 196lb and today's weight is 194.6lb.  Injection that was given to patient is [place] 156mg (patient provided medication).  Patient feeling well.  After injection, patient sat for observation to make she of no reaction(s).   Patient did not have any c/o from today's injection.  Patient will follow up in 4 weeks   No visual SI or Mania today

## 2022-09-11 ENCOUNTER — Encounter (HOSPITAL_COMMUNITY): Payer: Self-pay | Admitting: Psychiatry

## 2022-09-11 ENCOUNTER — Ambulatory Visit (INDEPENDENT_AMBULATORY_CARE_PROVIDER_SITE_OTHER): Payer: Medicare HMO | Admitting: Psychiatry

## 2022-09-11 VITALS — BP 116/72 | HR 83 | Temp 97.0°F | Ht 64.0 in | Wt 196.8 lb

## 2022-09-11 DIAGNOSIS — G259 Extrapyramidal and movement disorder, unspecified: Secondary | ICD-10-CM

## 2022-09-11 DIAGNOSIS — F411 Generalized anxiety disorder: Secondary | ICD-10-CM

## 2022-09-11 DIAGNOSIS — F312 Bipolar disorder, current episode manic severe with psychotic features: Secondary | ICD-10-CM

## 2022-09-11 MED ORDER — BENZTROPINE MESYLATE 0.5 MG PO TABS: ORAL_TABLET | ORAL | 0 refills | Status: DC

## 2022-09-11 MED ORDER — DIVALPROEX SODIUM 500 MG PO DR TAB: 500.0000 mg | DELAYED_RELEASE_TABLET | Freq: Every day | ORAL | 0 refills | Status: DC

## 2022-09-11 NOTE — Progress Notes (Signed)
[facility] Outpatient Follow up visit  Patient Identification: Heather Marquez MRN:  [id number] Date of Evaluation:  09/11/2022 Referral Source: [facility] mental health Chief Complaint:   Chief Complaint   Follow-up    Fu Bipolar Visit Diagnosis:    ICD-10-CM   1. Severe manic bipolar 1 disorder with psychotic behavior (HCC)  F31.2 Valproic Acid level    2. 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 Anxiety state  F41.1             History of Present Illness:  62   years old currently married African-American female initially referred by [facility] committee mental healthcare after her relocation here.  Doing fair, denies psychosis anxiety is controlled  No side effects, cogentin helps with side effects Anxiety is manageable Taking injection invega, has kept stable  Mood  is stable  Aggravating factor: non compliance history Modifying factor: husband   Past Psychiatric History: bipolar, manic as per admission history  Previous Psychotropic Medications: Yes     Family History: History reviewed. No pertinent family history.  Social History:   Social History   Socioeconomic History   Marital status: Married    Spouse name: Not on file   Number of children: Not on file   Years of education: Not on file   Highest education level: Not on file  Occupational History   Not on file  Tobacco Use   Smoking status: Never   Smokeless tobacco: Never  Vaping Use   Vaping Use: Never used  Substance and Sexual Activity   Alcohol use: No   Drug use: No   Sexual activity: Yes    Partners: Male  Other Topics Concern   Not on file  Social History Narrative   Not on file   Social Determinants of Health   Financial Resource Strain: Not on file  Food Insecurity: Not on file  Transportation Needs: Not on file  Physical Activity: Not on file  Stress: Not on file  Social Connections: Not on file      Allergies:   Allergies  Allergen Reactions   Mango Flavor Rash     Metabolic Disorder Labs: Lab Results  Component Value Date   HGBA1C 5.6 10/01/2019   MPG 114.02 10/01/2019   No results found for: "PROLACTIN" Lab Results  Component Value Date   CHOL 160 10/01/2019   TRIG 46 10/01/2019   HDL 71 10/01/2019   CHOLHDL 2.3 10/01/2019   VLDL 9 10/01/2019   LDLCALC 80 10/01/2019     Current Medications: Current Outpatient Medications  Medication Sig Dispense Refill   amLODipine (NORVASC) 10 MG tablet Take 1 tablet (10 mg total) by mouth daily. 30 tablet 1   paliperidone (INVEGA SUSTENNA) 156 MG/ML SUSY injection ADMINISTER 1 ML IN THE MUSCLE EVERY 30 DAYS 1 mL 1   rosuvastatin (CRESTOR) 5 MG tablet Take 1 tablet (5 mg total) by mouth daily. 30 tablet 1   benztropine (COGENTIN) 0.5 MG tablet TAKE 1 TABLET EVERY DAY 90 tablet 0   divalproex (DEPAKOTE) 500 MG DR tablet Take 1 tablet (500 mg total) by mouth at bedtime. 90 tablet 0   Current Facility-Administered Medications  Medication Dose Route Frequency Provider Last Rate Last Admin   paliperidone (INVEGA SUSTENNA) injection 156 mg  156 mg Intramuscular Q30 days Meade Maw, MD   156 mg at 08/30/22 1033      Psychiatric Specialty Exam: Review of Systems  Cardiovascular:  Negative for chest pain.  Psychiatric/Behavioral:  Negative  for depression, hallucinations and substance abuse.     Blood pressure 116/72, pulse 83, temperature (!) 97 F (36.1 C), temperature source Oral, height 5\' 4"  (1.626 m), weight 196 lb 12.8 oz (89.3 kg).Body mass index is 33.78 kg/m.  General Appearance: casual  Eye Contact: fair  Speech:  Low   Volume:  Decreased  Mood: fair  Affect:   Thought Process:  Goal Directed  Orientation:  Full (Time, Place, and Person)  Thought Content:  Rumination  Suicidal Thoughts:  No  Homicidal Thoughts:  No  Memory:  Immediate;   Fair Recent;   Fair  Judgement:  Fair  Insight:  Fair  Psychomotor Activity:    Concentration:  Concentration: Fair and Attention  Span: Fair  Recall:  [employer] of Knowledge:Fair  Language: Fair  Akathisia:    Handed:  Right  AIMS (if indicated): no involuntary movements  Assets:  Desire for Improvement Social Support  ADL's:  Intact  Cognition: WNL  Sleep:  fair  Past documentation copy reviewed  Treatment Plan Summary: Medication management and Plan as follows   Prior documentation reviewed   1. Bipolar per history: manic ;  stable continue inj invega, and depakote Will send for lab work for levels today 2. Anxiety; doing fair, keeps busy , husband is supportive 3. Sleep:managing it fair 4. EPS ; manageable on cogentin, continue  Fu 13m.  Direct care time spent 20 min including lab request, face to face and documentation. Merian Capron MD

## 2022-09-19 ENCOUNTER — Other Ambulatory Visit (HOSPITAL_COMMUNITY): Payer: Self-pay | Admitting: Psychiatry

## 2022-09-27 ENCOUNTER — Ambulatory Visit (HOSPITAL_COMMUNITY): Payer: Medicare HMO | Admitting: Psychiatry

## 2022-09-27 ENCOUNTER — Encounter (HOSPITAL_COMMUNITY): Payer: Self-pay

## 2022-09-27 ENCOUNTER — Ambulatory Visit (INDEPENDENT_AMBULATORY_CARE_PROVIDER_SITE_OTHER): Payer: Medicare HMO

## 2022-09-27 VITALS — BP 102/78 | HR 86 | Temp 98.6°F | Ht 64.0 in | Wt 195.0 lb

## 2022-09-27 DIAGNOSIS — F312 Bipolar disorder, current episode manic severe with psychotic features: Secondary | ICD-10-CM

## 2022-09-27 MED ORDER — PALIPERIDONE PALMITATE ER 156 MG/ML IM SUSY
156.0000 mg | PREFILLED_SYRINGE | Freq: Once | INTRAMUSCULAR | Status: AC
  Administered 2022-09-27: 156 mg via INTRAMUSCULAR

## 2022-09-27 NOTE — Progress Notes (Signed)
  Patient came in for monthly Invega Sustenna 156mg /62mL injection. She tolerated fine in the right Deltoid. No SI and patient seems to be doing okay. Will return in around 1 month for next scheduled injection.   Established Patient Office Visit  Subjective   Patient ID: Heather Marquez, female    DOB: Oct 04, 1960  Age: 61 y.o. MRN: [id number]  No chief complaint on file.   HPI    ROS    Objective:     There were no vitals taken for this visit.   Physical Exam   No results found for any visits on 09/27/22.    The 10-year ASCVD risk score (Arnett DK, et al., 2019) is: 8.4%    Assessment & Plan:   Problem List Items Addressed This Visit   None   No follow-ups on file.    [place], [place]

## 2022-09-27 NOTE — Progress Notes (Signed)
   Established Patient Office Visit  Subjective   Patient ID: Heather Marquez, female    DOB: [birth year]  Age: 62 y.o. MRN: [id number]  No chief complaint on file.   HPI    ROS    Objective:     There were no vitals taken for this visit.   Physical Exam   No results found for any visits on 09/27/22.    The 10-year ASCVD risk score (Arnett DK, et al., 2019) is: 8.4%    Assessment & Plan:   Problem List Items Addressed This Visit   None   No follow-ups on file.    [place], [place]

## 2022-09-28 ENCOUNTER — Ambulatory Visit (HOSPITAL_COMMUNITY): Payer: Medicare HMO | Admitting: Psychiatry

## 2022-10-04 ENCOUNTER — Other Ambulatory Visit (HOSPITAL_COMMUNITY): Payer: Self-pay | Admitting: Psychiatry

## 2022-10-04 LAB — VALPROIC ACID LEVEL: Valproic Acid Lvl: 61.1 mg/L (ref 50.0–100.0)

## 2022-10-21 ENCOUNTER — Other Ambulatory Visit (HOSPITAL_COMMUNITY): Payer: Self-pay | Admitting: Psychiatry

## 2022-10-25 ENCOUNTER — Ambulatory Visit (INDEPENDENT_AMBULATORY_CARE_PROVIDER_SITE_OTHER): Payer: Medicare HMO

## 2022-10-25 ENCOUNTER — Encounter (HOSPITAL_COMMUNITY): Payer: Self-pay

## 2022-10-25 VITALS — BP 108/66 | HR 78 | Temp 97.8°F | Ht 65.5 in | Wt 195.0 lb

## 2022-10-25 DIAGNOSIS — F312 Bipolar disorder, current episode manic severe with psychotic features: Secondary | ICD-10-CM

## 2022-10-25 DIAGNOSIS — F310 Bipolar disorder, current episode hypomanic: Secondary | ICD-10-CM | POA: Diagnosis not present

## 2022-10-25 NOTE — Progress Notes (Signed)
Patient in today for her due Invega Sustenna 156 mg/ml IM injection every 30 days.  Patient presented with flat affect, level mood and denied any auditory or visual hallucinations, no suicidal or homicidal ideations with no plan, intent, or means to want to harm self or others.  Patietn reported no current problems with medication and stated doing okay with current medication regimen.  Patient's due Invega Sustenna 156 mg/ml IM injection prepared as ordered and given to patient in her left deltoid due to date patient could come into the office. Patient tolerated due injection without complaint of pain or discomfort and agreed to return in 30 days for next injection.  Patient to call if any problems with medications or symptoms prior to next appointment.

## 2022-11-17 ENCOUNTER — Other Ambulatory Visit (HOSPITAL_COMMUNITY): Payer: Self-pay | Admitting: Psychiatry

## 2022-11-19 ENCOUNTER — Telehealth (HOSPITAL_COMMUNITY): Payer: Self-pay

## 2022-11-19 NOTE — Telephone Encounter (Signed)
Medication management - Telephone call with patient to inform Dr. De Nurse had sent in her new Paliperidone Lorayne Bender Wilder Glade) 156 mg/ml IM order for her injection this week to her [facility] in [place]. Patient to pick up and to come in on 11/22/22 for next due injection.

## 2022-11-19 NOTE — Telephone Encounter (Signed)
Medication refill - Telephone call with patient, after she left a message, she is in need of a new Invega Sustenna 156 mg IM order and verified this is to go to the [facility] in [place]. Medication last ordered 10/22/22 with no refills and was given 10/25/22.  Patient needs a new order for due injection 11/22/22.

## 2022-11-22 ENCOUNTER — Ambulatory Visit (INDEPENDENT_AMBULATORY_CARE_PROVIDER_SITE_OTHER): Payer: Medicare HMO

## 2022-11-22 ENCOUNTER — Encounter (HOSPITAL_COMMUNITY): Payer: Self-pay

## 2022-11-22 VITALS — BP 122/74 | HR 83 | Temp 97.8°F | Ht 65.5 in | Wt 193.0 lb

## 2022-11-22 DIAGNOSIS — F312 Bipolar disorder, current episode manic severe with psychotic features: Secondary | ICD-10-CM

## 2022-11-22 DIAGNOSIS — F310 Bipolar disorder, current episode hypomanic: Secondary | ICD-10-CM

## 2022-11-22 NOTE — Progress Notes (Signed)
Patient in today for due Invega Sustenna 156 mg/ml IM injection every 30 days.  Patient presented with flat affect, level and pleasant mood and denied any suicidal or homicidal ideations, no plan, intent, or reported means to want to harm self or others and denied any auditory and visual hallucinations.  Patient denied any issues with monthly injection and stated she has been doing some indoor bike riding and watching what she eats to avoid weigh gain as has lost 2 pounds since last visit.  Patient's due injection prepared as ordered, Invega Sustenna 156 mg/ml IM every 30 days, and given to patient in her Right Deltoid area.  Patient tolerated due injection without any complaint of pain or discomfort and will return in 30 days for next due injection.  Patient reminded of next eval with provider scheduled for 12/11/22 and patient stated she was aware.  Patient to call if any issues prior to next appointment left without any issues stated this date.

## 2022-12-11 ENCOUNTER — Ambulatory Visit (INDEPENDENT_AMBULATORY_CARE_PROVIDER_SITE_OTHER): Payer: Medicare HMO | Admitting: Psychiatry

## 2022-12-11 ENCOUNTER — Encounter (HOSPITAL_COMMUNITY): Payer: Self-pay | Admitting: Psychiatry

## 2022-12-11 VITALS — BP 116/79 | HR 79 | Ht 65.5 in | Wt 190.0 lb

## 2022-12-11 DIAGNOSIS — F411 Generalized anxiety disorder: Secondary | ICD-10-CM | POA: Diagnosis not present

## 2022-12-11 DIAGNOSIS — G259 Extrapyramidal and movement disorder, unspecified: Secondary | ICD-10-CM | POA: Diagnosis not present

## 2022-12-11 DIAGNOSIS — F312 Bipolar disorder, current episode manic severe with psychotic features: Secondary | ICD-10-CM | POA: Diagnosis not present

## 2022-12-11 MED ORDER — DIVALPROEX SODIUM 500 MG PO DR TAB: 500.0000 mg | DELAYED_RELEASE_TABLET | Freq: Every day | ORAL | 0 refills | Status: DC

## 2022-12-11 MED ORDER — BENZTROPINE MESYLATE 0.5 MG PO TABS: 0.5000 mg | ORAL_TABLET | Freq: Every day | ORAL | 0 refills | Status: DC

## 2022-12-11 MED ORDER — INVEGA SUSTENNA 156 MG/ML IM SUSY: PREFILLED_SYRINGE | INTRAMUSCULAR | 0 refills | Status: DC

## 2022-12-11 NOTE — Progress Notes (Signed)
[facility] Outpatient Follow up visit  Patient Identification: Heather Marquez MRN:  [id number] Date of Evaluation:  12/11/2022 Referral Source: [facility] mental health Chief Complaint:     Fu Bipolar Visit Diagnosis:    ICD-10-CM   1. Severe manic bipolar 1 disorder with psychotic behavior  F31.2     2. 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 Anxiety state  F41.1             History of Present Illness:  62   years old currently married African-American female initially referred by [facility] committee mental healthcare after her relocation here.  Doing fair, tolerating meds, less shakes and cogentin helps  Meds and injection has kept her out of hospital  Husband supportive   Mood  stable   Aggravating factor: non compliance history Modifying factor: husband   Past Psychiatric History: bipolar, manic as per admission history  Previous Psychotropic Medications: Yes     Family History: History reviewed. No pertinent family history.  Social History:   Social History   Socioeconomic History   Marital status: Married    Spouse name: Not on file   Number of children: Not on file   Years of education: Not on file   Highest education level: Not on file  Occupational History   Not on file  Tobacco Use   Smoking status: Never   Smokeless tobacco: Never  Vaping Use   Vaping Use: Never used  Substance and Sexual Activity   Alcohol use: No   Drug use: No   Sexual activity: Yes    Partners: Male  Other Topics Concern   Not on file  Social History Narrative   Not on file   Social Determinants of Health   Financial Resource Strain: Not on file  Food Insecurity: Not on file  Transportation Needs: Not on file  Physical Activity: Not on file  Stress: Not on file  Social Connections: Not on file      Allergies:   Allergies  Allergen Reactions   Mango Flavor Rash    Metabolic Disorder Labs: Lab Results  Component Value Date   HGBA1C 5.6 10/01/2019   MPG  114.02 10/01/2019   No results found for: "PROLACTIN" Lab Results  Component Value Date   CHOL 160 10/01/2019   TRIG 46 10/01/2019   HDL 71 10/01/2019   CHOLHDL 2.3 10/01/2019   VLDL 9 10/01/2019   LDLCALC 80 10/01/2019     Current Medications: Current Outpatient Medications  Medication Sig Dispense Refill   amLODipine (NORVASC) 10 MG tablet Take 1 tablet (10 mg total) by mouth daily. 30 tablet 1   rosuvastatin (CRESTOR) 5 MG tablet Take 1 tablet (5 mg total) by mouth daily. 30 tablet 1   benztropine (COGENTIN) 0.5 MG tablet Take 1 tablet (0.5 mg total) by mouth daily. 90 tablet 0   divalproex (DEPAKOTE) 500 MG DR tablet Take 1 tablet (500 mg total) by mouth at bedtime. 90 tablet 0   paliperidone (INVEGA SUSTENNA) 156 MG/ML SUSY injection INJECT 1 ML INTO THE MUSCLE EVERY 30 DAYS 1 mL 0   No current facility-administered medications for this visit.      Psychiatric Specialty Exam: Review of Systems  Cardiovascular:  Negative for chest pain.  Psychiatric/Behavioral:  Negative for depression, hallucinations and substance abuse.     Blood pressure 116/79, pulse 79, height 5' 5.5" (1.664 m), weight 190 lb (86.2 kg), SpO2 97 %.Body mass index is 31.14 kg/m.  General Appearance: casual  Eye Contact: fair  Speech:  Low   Volume:  Decreased  Mood: fair  Affect:   Thought Process:  Goal Directed  Orientation:  Full (Time, Place, and Person)  Thought Content:  Rumination  Suicidal Thoughts:  No  Homicidal Thoughts:  No  Memory:  Immediate;   Fair Recent;   Fair  Judgement:  Fair  Insight:  Fair  Psychomotor Activity:    Concentration:  Concentration: Fair and Attention Span: Fair  Recall:  [employer] of Knowledge:Fair  Language: Fair  Akathisia:    Handed:  Right  AIMS (if indicated): no involuntary movements  Assets:  Desire for Improvement Social Support  ADL's:  Intact  Cognition: WNL  Sleep:  fair  Past documentation copy reviewed  Treatment Plan  Summary: Medication management and Plan as follows     Prior documentation reviewed   1. Bipolar per history: manic ;  remains stable continue depakote, last levels 61 reviewed Continue injection invega ,  2. Anxiety; doing fair, keeps busy , husband is supportive 3. Sleep:fair continue sleep hygiene  4. EPS ; manageable not worse, continue cogentin, she does not want to increase the dose    Fu 60m.  Direct care time spent 20 minutes including lab request, face to face and documentation. Merian Capron MD

## 2022-12-20 ENCOUNTER — Telehealth (HOSPITAL_COMMUNITY): Payer: Self-pay

## 2022-12-20 ENCOUNTER — Ambulatory Visit (HOSPITAL_COMMUNITY): Payer: Medicare HMO | Admitting: Psychiatry

## 2022-12-24 ENCOUNTER — Ambulatory Visit (INDEPENDENT_AMBULATORY_CARE_PROVIDER_SITE_OTHER): Payer: Medicare HMO

## 2022-12-24 ENCOUNTER — Encounter (HOSPITAL_COMMUNITY): Payer: Self-pay

## 2022-12-24 VITALS — BP 101/71 | HR 70 | Temp 98.3°F | Ht 65.0 in | Wt 189.0 lb

## 2022-12-24 DIAGNOSIS — F312 Bipolar disorder, current episode manic severe with psychotic features: Secondary | ICD-10-CM | POA: Diagnosis not present

## 2022-12-24 MED ORDER — PALIPERIDONE PALMITATE ER 156 MG/ML IM SUSY
156.0000 mg | PREFILLED_SYRINGE | Freq: Once | INTRAMUSCULAR | Status: AC
  Administered 2022-12-24: 156 mg via INTRAMUSCULAR

## 2022-12-24 NOTE — Progress Notes (Signed)
Patient presented today for due Invega Sustenna 156 mg/ml IM injection every 30 days. Patient presented with flat affect, level and pleasant mood and denied any auditory or visual hallucinations and no thoughts of wanting to harm herself or others at this time.  No suicidal or homicidal ideations, plans or intent and stated the medication was helping.  Due Invega Sustenna 156 mg/ml IM injection prepared as ordered and given to patient in her left deltoid area.  No complaints of pain or discomfort reported from the injection and patient agreed to return in 30 days for next due injection.  Patient to call if any issues prior to next due injection.

## 2022-12-27 ENCOUNTER — Ambulatory Visit (HOSPITAL_COMMUNITY): Payer: Medicare HMO | Admitting: Psychiatry

## 2023-01-13 ENCOUNTER — Other Ambulatory Visit (HOSPITAL_COMMUNITY): Payer: Self-pay | Admitting: Psychiatry

## 2023-01-24 ENCOUNTER — Encounter (HOSPITAL_COMMUNITY): Payer: Self-pay

## 2023-01-24 ENCOUNTER — Ambulatory Visit (INDEPENDENT_AMBULATORY_CARE_PROVIDER_SITE_OTHER): Payer: Medicare HMO

## 2023-01-24 VITALS — BP 118/70 | HR 87 | Temp 97.8°F | Ht 64.5 in | Wt 189.0 lb

## 2023-01-24 DIAGNOSIS — F312 Bipolar disorder, current episode manic severe with psychotic features: Secondary | ICD-10-CM

## 2023-01-24 MED ORDER — PALIPERIDONE PALMITATE ER 156 MG/ML IM SUSY
156.0000 mg | PREFILLED_SYRINGE | Freq: Once | INTRAMUSCULAR | Status: AC
  Administered 2023-01-24: 156 mg via INTRAMUSCULAR

## 2023-01-24 NOTE — Progress Notes (Signed)
Patient in today for due every 30 day [place] 156mg /ml IM injection.  Patient brought in medication fromher [facility] pharmacy and presented with flat affect, level and pleasant mood and denied any current symptoms. Patient denied any auditory or visual hallucinations, no suicidal or homicidal ideations and no plan,intent or reported means to want to harm self others at this time. Patient reported she felt her current medication regimen was working well at this time and no other concerns noted. Patient's Invega Sustenna 156 mg/ml IM injection prepared as ordered and given to patient in her right deltoid area.  Patient tolerated due injection without any complaint of pain or discomfort and agreed to return in 30 days for next due injection.  Patient to call if any issues until next appointment.

## 2023-02-20 ENCOUNTER — Other Ambulatory Visit (HOSPITAL_COMMUNITY): Payer: Self-pay | Admitting: Psychiatry

## 2023-02-20 ENCOUNTER — Other Ambulatory Visit (HOSPITAL_COMMUNITY): Payer: Self-pay | Admitting: *Deleted

## 2023-02-20 MED ORDER — INVEGA SUSTENNA 156 MG/ML IM SUSY: PREFILLED_SYRINGE | INTRAMUSCULAR | 0 refills | Status: DC

## 2023-02-26 ENCOUNTER — Encounter (HOSPITAL_COMMUNITY): Payer: Self-pay | Admitting: Psychiatry

## 2023-02-26 ENCOUNTER — Ambulatory Visit (INDEPENDENT_AMBULATORY_CARE_PROVIDER_SITE_OTHER): Payer: Medicare HMO | Admitting: *Deleted

## 2023-02-26 VITALS — BP 117/71 | HR 73 | Ht 64.5 in | Wt 182.0 lb

## 2023-02-26 DIAGNOSIS — F312 Bipolar disorder, current episode manic severe with psychotic features: Secondary | ICD-10-CM

## 2023-02-26 MED ORDER — PALIPERIDONE PALMITATE ER 156 MG/ML IM SUSY
156.0000 mg | PREFILLED_SYRINGE | INTRAMUSCULAR | Status: AC
  Administered 2023-02-26 – 2024-01-09 (×7): 156 mg via INTRAMUSCULAR

## 2023-02-26 NOTE — Progress Notes (Addendum)
PATIENT ARRIVED FOR INJECTION paliperidone (INVEGA SUSTENNA) injection 156 mg  PATIENT ARRIVED WITHOUT MEDICATION HAD TO CALL HUSBAND TO BRING. PATIENT IS PLEASANT UPON OUR 1ST ENCOUNTER. PATIENT IS VERY FLAT AFFECT.  TOLERATED INJECTION WELL IN LEFT-DELTOID  NO SI/AI NOR AH/VH  --- This above note treatment reviewed from Nurse

## 2023-02-26 NOTE — Patient Instructions (Signed)
PATIENT ARRIVED FOR INJECTION paliperidone (INVEGA SUSTENNA) injection 156 mg  PATIENT ARRIVED WITHOUT MEDICATION HAD TO CALL HUSBAND TO BRING. PATIENT IS PLEASANT UPON OUR 1ST ENCOUNTER. PATIENT IS VERY FLAT AFFECT.  TOLERATED INJECTION WELL IN LEFT-DELTOID  NO SI/AI NOR AH/VH

## 2023-03-21 ENCOUNTER — Other Ambulatory Visit (HOSPITAL_COMMUNITY): Payer: Self-pay | Admitting: Psychiatry

## 2023-03-28 ENCOUNTER — Ambulatory Visit (HOSPITAL_COMMUNITY): Payer: Medicare HMO | Admitting: Psychiatry

## 2023-03-28 ENCOUNTER — Encounter (HOSPITAL_COMMUNITY): Payer: Self-pay | Admitting: Psychiatry

## 2023-03-28 VITALS — BP 128/79 | HR 8 | Ht 64.5 in | Wt 186.0 lb

## 2023-03-28 DIAGNOSIS — F312 Bipolar disorder, current episode manic severe with psychotic features: Secondary | ICD-10-CM | POA: Diagnosis not present

## 2023-03-28 NOTE — Progress Notes (Signed)
paliperidone (INVEGA SUSTENNA) injection 156 mg  tolerated well in Right-Arm Patient arrived today  & was friendly & smiling. Pleasant mood.  NO SI/HI  NOR AH/VH

## 2023-04-10 ENCOUNTER — Other Ambulatory Visit (HOSPITAL_COMMUNITY): Payer: Self-pay | Admitting: Psychiatry

## 2023-04-16 ENCOUNTER — Ambulatory Visit (HOSPITAL_COMMUNITY): Payer: Medicare HMO | Admitting: Psychiatry

## 2023-04-20 ENCOUNTER — Other Ambulatory Visit (HOSPITAL_COMMUNITY): Payer: Self-pay | Admitting: Psychiatry

## 2023-04-25 ENCOUNTER — Ambulatory Visit (INDEPENDENT_AMBULATORY_CARE_PROVIDER_SITE_OTHER): Payer: Medicare HMO | Admitting: Psychiatry

## 2023-04-25 ENCOUNTER — Encounter (HOSPITAL_COMMUNITY): Payer: Self-pay | Admitting: Psychiatry

## 2023-04-25 VITALS — BP 128/76 | HR 82 | Ht 64.5 in | Wt 187.0 lb

## 2023-04-25 DIAGNOSIS — F312 Bipolar disorder, current episode manic severe with psychotic features: Secondary | ICD-10-CM

## 2023-04-25 NOTE — Progress Notes (Addendum)
PATIENT ARRIVED FOR INJECTION NVEGA SUSTENNA 156 MG/ML SUSY injection  TOLERATED INJECTION WELL IN RIGHT DELTOID. EACH VISIT PATIENT BECOMES A LITTLE MORE COMFORTABLE TALKING TO ME.  NO AH/VH NOR SI/AI

## 2023-04-25 NOTE — Patient Instructions (Signed)
PATIENT ARRIVED FOR INJECTION NVEGA SUSTENNA 156 MG/ML SUSY injection  TOLERATED INJECTION WELL IN RIGHT DELTOID. EACH VISIT PATIENT BECOMES A LITTLE MORE COMFORTABLE TALKING TO ME.  NO AH/VH NOR SI/AI

## 2023-04-30 ENCOUNTER — Encounter (HOSPITAL_COMMUNITY): Payer: Self-pay | Admitting: Psychiatry

## 2023-04-30 ENCOUNTER — Ambulatory Visit (INDEPENDENT_AMBULATORY_CARE_PROVIDER_SITE_OTHER): Payer: Medicare HMO | Admitting: Psychiatry

## 2023-04-30 VITALS — BP 109/85 | HR 82

## 2023-04-30 DIAGNOSIS — F411 Generalized anxiety disorder: Secondary | ICD-10-CM | POA: Diagnosis not present

## 2023-04-30 DIAGNOSIS — F312 Bipolar disorder, current episode manic severe with psychotic features: Secondary | ICD-10-CM | POA: Diagnosis not present

## 2023-04-30 DIAGNOSIS — G259 Extrapyramidal and movement disorder, unspecified: Secondary | ICD-10-CM | POA: Diagnosis not present

## 2023-04-30 MED ORDER — BENZTROPINE MESYLATE 0.5 MG PO TABS: 0.5000 mg | ORAL_TABLET | Freq: Every day | ORAL | 0 refills | Status: DC

## 2023-04-30 MED ORDER — INVEGA SUSTENNA 156 MG/ML IM SUSY: PREFILLED_SYRINGE | INTRAMUSCULAR | 2 refills | Status: DC

## 2023-04-30 NOTE — Progress Notes (Signed)
[facility] Outpatient Follow up visit  Patient Identification: Heather Marquez MRN:  [id number] Date of Evaluation:  04/30/2023 Referral Source: [facility] mental health Chief Complaint:   Chief Complaint   Follow-up    Fu Bipolar Visit Diagnosis:    ICD-10-CM   1. 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 Anxiety state  F41.1             History of Present Illness:  62   years old currently married African-American female initially referred by [facility] committee mental healthcare after her relocation here.  Doing fair, handling stress. Not paranoid, is here with her husband who confirms she is doing better Less tremors and manageable with cogentin.     Mood  remains stable   Aggravating factor: non compliance history Modifying factor: husband   Past Psychiatric History: bipolar, manic as per admission history  Previous Psychotropic Medications: Yes     Family History: History reviewed. No pertinent family history.  Social History:   Social History   Socioeconomic History   Marital status: Married    Spouse name: Not on file   Number of children: Not on file   Years of education: Not on file   Highest education level: Not on file  Occupational History   Not on file  Tobacco Use   Smoking status: Never   Smokeless tobacco: Never  Vaping Use   Vaping status: Never Used  Substance and Sexual Activity   Alcohol use: No   Drug use: No   Sexual activity: Yes    Partners: Male  Other Topics Concern   Not on file  Social History Narrative   Not on file   Social Determinants of Health   Financial Resource Strain: Low Risk  (10/22/2022)   Received from [facility], [facility]   Overall Financial Resource Strain (CARDIA)    Difficulty of Paying Living Expenses: Not hard at all  Food Insecurity: No Food Insecurity (10/22/2022)   Received from [facility], [facility]   Hunger  Vital Sign    Worried About Running Out of Food in the Last Year: Never true    Ran Out of Food in the Last Year: Never true  Transportation Needs: No Transportation Needs (10/22/2022)   Received from [facility], [facility]   PRAPARE - Transportation    Lack of Transportation (Medical): No    Lack of Transportation (Non-Medical): No  Physical Activity: Sufficiently Active (10/22/2022)   Received from [facility], [facility]   Exercise Vital Sign    Days of Exercise per Week: 5 days    Minutes of Exercise per Session: 30 min  Stress: No Stress Concern Present (10/22/2022)   Received from [facility], [facility] - Occupational Stress Questionnaire    Feeling of Stress : Not at all  Social Connections: Socially Integrated (10/22/2022)   Received from [facility], [facility]   Social Network    How would you rate your social network (family, work, friends)?: Good participation with social networks      Allergies:   Allergies  Allergen Reactions   Mango Flavor Rash    Metabolic Disorder Labs: Lab Results  Component Value Date   HGBA1C 5.6 10/01/2019   MPG 114.02 10/01/2019   No results found for: "PROLACTIN" Lab Results  Component Value Date   CHOL 160 10/01/2019  TRIG 46 10/01/2019   HDL 71 10/01/2019   CHOLHDL 2.3 10/01/2019   VLDL 9 10/01/2019   LDLCALC 80 10/01/2019     Current Medications: Current Outpatient Medications  Medication Sig Dispense Refill   amLODipine (NORVASC) 10 MG tablet Take 1 tablet (10 mg total) by mouth daily. 30 tablet 1   benztropine (COGENTIN) 0.5 MG tablet Take 1 tablet (0.5 mg total) by mouth daily. 90 tablet 0   divalproex (DEPAKOTE) 500 MG DR tablet TAKE 1 TABLET AT BEDTIME 90 tablet 0   INVEGA SUSTENNA 156 MG/ML SUSY injection INJECT 1 ML INTO THE MUSCLE EVERY 30 DAYS 1 mL 0   rosuvastatin (CRESTOR) 5 MG tablet Take 1 tablet (5 mg total) by mouth daily. 30 tablet 1    Current Facility-Administered Medications  Medication Dose Route Frequency Provider Last Rate Last Admin   paliperidone (INVEGA SUSTENNA) injection 156 mg  156 mg Intramuscular Q30 days Thresa Ross, MD   156 mg at 03/28/23 0841      Psychiatric Specialty Exam: Review of Systems  Cardiovascular:  Negative for chest pain.  Psychiatric/Behavioral:  Negative for depression, hallucinations and substance abuse.     Blood pressure 109/85, pulse 82.There is no height or weight on file to calculate BMI.  General Appearance: casual  Eye Contact: fair  Speech:  Low   Volume:  Decreased  Mood: fair  Affect:   Thought Process:  Goal Directed  Orientation:  Full (Time, Place, and Person)  Thought Content:  Rumination  Suicidal Thoughts:  No  Homicidal Thoughts:  No  Memory:  Immediate;   Fair Recent;   Fair  Judgement:  Fair  Insight:  Fair  Psychomotor Activity:    Concentration:  Concentration: Fair and Attention Span: Fair  Recall:  [employer] of Knowledge:Fair  Language: Fair  Akathisia:    Handed:  Right  AIMS (if indicated): no involuntary movements  Assets:  Desire for Improvement Social Support  ADL's:  Intact  Cognition: WNL  Sleep:  fair  Past documentation copy reviewed  Treatment Plan Summary: Medication management and Plan as follows     Prior documentation reviewed   1. Bipolar per history: manic ; stable on invega. Calm and cooperative 2. Anxiety;manageble, goes to church and visits family  3. Sleep:fair , continue sleep hygiene   4. EPS ; not worse, continue cogentin     Fu 9m.  Direct care time spent 20 minutes including lab request, face to face and documentation. Thresa Ross MD

## 2023-05-14 ENCOUNTER — Telehealth (HOSPITAL_COMMUNITY): Payer: Self-pay | Admitting: Psychiatry

## 2023-05-14 NOTE — Telephone Encounter (Signed)
Patient left voicemail 05/14/2023@1323  requesting a call from the nurse to discuss an issue.Called patient back and she stated she wanted to let the nurse know that she was changing the pharmacy that supplies her paliperidone (INVEGA SUSTENNA) injection 156 mg to  [facility] - [place], [place] - [address] Phone: [phone]  Fax: [phone]    and that this pharmacy would be calling for an order. Next injection scheduled for 05/23/2023. Patient phone # [phone].

## 2023-05-15 ENCOUNTER — Other Ambulatory Visit (HOSPITAL_COMMUNITY): Payer: Self-pay | Admitting: *Deleted

## 2023-05-15 MED ORDER — INVEGA SUSTENNA 156 MG/ML IM SUSY: PREFILLED_SYRINGE | INTRAMUSCULAR | 2 refills | Status: DC

## 2023-05-15 NOTE — Telephone Encounter (Signed)
Done Please Sign

## 2023-05-17 ENCOUNTER — Other Ambulatory Visit (HOSPITAL_COMMUNITY): Payer: Self-pay | Admitting: Psychiatry

## 2023-05-18 ENCOUNTER — Other Ambulatory Visit (HOSPITAL_COMMUNITY): Payer: Self-pay | Admitting: Psychiatry

## 2023-05-23 ENCOUNTER — Telehealth (HOSPITAL_COMMUNITY): Payer: Self-pay | Admitting: Psychiatry

## 2023-05-23 ENCOUNTER — Ambulatory Visit (HOSPITAL_COMMUNITY): Payer: Medicare HMO | Admitting: Psychiatry

## 2023-05-23 NOTE — Telephone Encounter (Signed)
Patient called stating she is scheduled for her monthly clinic administered injection on Monday 05/27/2023 and the pharmacy indicates they have not received the order. Requests an order for  paliperidone (INVEGA SUSTENNA) injection 156 mg be sent to:  [facility] [address] [place] - [address] Phone: [phone]  Fax: [phone]     [facility] pharmacy and spoke with staff named Thayer Ohm who stated the prescription refills order was transferred 05/14/2023 to:   [facility] - [place], [place] - [address] Phone: [phone]  Fax: [phone]     Contacted [facility] pharmacy and spoke with Herbert Seta in the Specialized Pharmacy Department who stated they had been trying to call patient without success to obtain approval to fill the order. Verified that this pharmacy had an incorrect phone number for patient that was off by one digit. Herbert Seta stated they could not get the medication to patient by Monday therefore an order needs to be sent to the [facility] pharmacy. Contacted patient to inform her and she stated she wants to go back to using the [facility] pharmacy for this medication and will call [facility] to cancel.   Patient requests order/refills be sent to  [facility] [address] [place] - [address] Phone: [phone]  Fax: [phone]    In time for injection on 05/27/2023.

## 2023-05-24 NOTE — Telephone Encounter (Signed)
Patient called again stating [facility] had gotten the medication, that she was picking it up today, and would keep the appointment as scheduled for 05/27/2023.

## 2023-05-24 NOTE — Telephone Encounter (Signed)
Patient called stating [facility] pharmacy cannot get the paliperidone (INVEGA SUSTENNA) injection 156 mg until Monday and she is going out of town Monday 05/27/2023 through Wednesday 05/29/2023 Needs assistance in locating this medication so she may receive injection prior to leaving town on Monday.

## 2023-05-27 ENCOUNTER — Encounter (HOSPITAL_COMMUNITY): Payer: Self-pay | Admitting: Psychiatry

## 2023-05-27 ENCOUNTER — Ambulatory Visit (INDEPENDENT_AMBULATORY_CARE_PROVIDER_SITE_OTHER): Payer: Medicare HMO | Admitting: Psychiatry

## 2023-05-27 VITALS — BP 121/75 | HR 75 | Ht 64.5 in | Wt 188.0 lb

## 2023-05-27 DIAGNOSIS — F312 Bipolar disorder, current episode manic severe with psychotic features: Secondary | ICD-10-CM | POA: Diagnosis not present

## 2023-05-27 NOTE — Patient Instructions (Signed)
PATIENT ARRIVED FOR MONTHLY paliperidone (INVEGA SUSTENNA) injection 156 mg  INJECTION TOLERATED WELL IN LEFT DELTOID. NO COMPLAINTS EXCITED ABOUT TRAVELING TO [place] THIS MORNING WITH HUSBAND  PATIENT VERY PLEASANT & SPOKE A LITTLE TO ME.  NO SI/HI  NOR AH/VH .

## 2023-05-27 NOTE — Progress Notes (Signed)
PATIENT ARRIVED FOR MONTHLY paliperidone (INVEGA SUSTENNA) injection 156 mg  INJECTION TOLERATED WELL IN LEFT DELTOID. NO COMPLAINTS EXCITED ABOUT TRAVELING TO [place] THIS MORNING WITH HUSBAND  PATIENT VERY PLEASANT & SPOKE A LITTLE TO ME.  NO SI/HI  NOR AH/VH .

## 2023-05-28 ENCOUNTER — Ambulatory Visit (HOSPITAL_COMMUNITY): Payer: Medicare HMO | Admitting: Psychiatry

## 2023-06-22 ENCOUNTER — Other Ambulatory Visit (HOSPITAL_COMMUNITY): Payer: Self-pay | Admitting: Psychiatry

## 2023-06-25 ENCOUNTER — Ambulatory Visit (HOSPITAL_COMMUNITY): Payer: Medicare HMO | Admitting: Psychiatry

## 2023-06-27 ENCOUNTER — Ambulatory Visit (INDEPENDENT_AMBULATORY_CARE_PROVIDER_SITE_OTHER): Payer: Medicare HMO | Admitting: Psychiatry

## 2023-06-27 ENCOUNTER — Encounter (HOSPITAL_COMMUNITY): Payer: Self-pay | Admitting: Psychiatry

## 2023-06-27 VITALS — BP 126/76 | HR 80 | Ht 64.5 in | Wt 189.0 lb

## 2023-06-27 DIAGNOSIS — F312 Bipolar disorder, current episode manic severe with psychotic features: Secondary | ICD-10-CM | POA: Diagnosis not present

## 2023-06-27 NOTE — Patient Instructions (Signed)
Patient arrived for her paliperidone (INVEGA SUSTENNA) injection 156 mg tolerated well in Right Deltoid Stated she had a good trip to [place]. on last visit & ate some good food.  Very Pleasant  NO SI/HI NOR AH/VH

## 2023-06-27 NOTE — Progress Notes (Addendum)
Patient arrived for her paliperidone (INVEGA SUSTENNA) injection 156 mg tolerated well in Right Deltoid Stated she had a good trip to [place]. on last visit & ate some good food.  Very Pleasant  NO SI/HI NOR AH/VH

## 2023-07-13 ENCOUNTER — Other Ambulatory Visit (HOSPITAL_COMMUNITY): Payer: Self-pay | Admitting: Psychiatry

## 2023-07-25 ENCOUNTER — Ambulatory Visit (INDEPENDENT_AMBULATORY_CARE_PROVIDER_SITE_OTHER): Payer: Medicare HMO | Admitting: Psychiatry

## 2023-07-25 ENCOUNTER — Encounter (HOSPITAL_COMMUNITY): Payer: Self-pay | Admitting: Psychiatry

## 2023-07-25 VITALS — BP 113/76 | HR 85 | Ht 64.5 in | Wt 187.0 lb

## 2023-07-25 DIAGNOSIS — F312 Bipolar disorder, current episode manic severe with psychotic features: Secondary | ICD-10-CM

## 2023-07-25 NOTE — Progress Notes (Addendum)
PATIENT ARRIVED FOR MONTHLY INJECTION paliperidone (INVEGA SUSTENNA) injection 156 mg . INJECTION TOLERATED WELL IN LEFT DELTOID.  NO SI/HI NOR VH/AH.  EXCITED ABOUT THANKSGIVING. PLANS ARE UP IN THE AIR BETWEEN HER HOME OR HER DAUGHTER'S HOME IN [facility].

## 2023-07-25 NOTE — Patient Instructions (Signed)
PATIENT ARRIVED FOR MONTHLY INJECTION paliperidone (INVEGA SUSTENNA) injection 156 mg . INJECTION TOLERATED WELL IN LEFT DELTOID.  NO SI/HI NOR VH/AH.  EXCITED ABOUT THANKSGIVING. PLANS ARE UP IN THE AIR BETWEEN HER HOME OR HER DAUGHTER'S HOME IN [facility].

## 2023-08-15 ENCOUNTER — Other Ambulatory Visit (HOSPITAL_COMMUNITY): Payer: Self-pay | Admitting: Psychiatry

## 2023-08-16 ENCOUNTER — Telehealth (HOSPITAL_COMMUNITY): Payer: Self-pay | Admitting: *Deleted

## 2023-08-16 NOTE — Telephone Encounter (Signed)
Rx REFILL REQUEST  [id number] - [address], [place] - [address]   paliperidone (INVEGA SUSTENNA) 156 MG/ML  injection   NEXT APPT  INJECTION 08/22/23 -- PROVIDER 08/29/23 LAST APPT 04/30/23

## 2023-08-21 ENCOUNTER — Ambulatory Visit (INDEPENDENT_AMBULATORY_CARE_PROVIDER_SITE_OTHER): Payer: Medicare HMO | Admitting: Psychiatry

## 2023-08-21 ENCOUNTER — Encounter (HOSPITAL_COMMUNITY): Payer: Self-pay | Admitting: Psychiatry

## 2023-08-21 VITALS — BP 117/75 | HR 79 | Ht 64.5 in | Wt 188.0 lb

## 2023-08-21 DIAGNOSIS — F312 Bipolar disorder, current episode manic severe with psychotic features: Secondary | ICD-10-CM | POA: Diagnosis not present

## 2023-08-21 NOTE — Progress Notes (Signed)
Patient arrived for injection:: paliperidone (INVEGA SUSTENNA) 156 MG/ML injection. Tolerated injection well in  Right Arm. Very Pleasant. Shared her plans for getting ready for the Christmas Holiday.  NO SI/HI  NOR AH/VH.

## 2023-08-21 NOTE — Patient Instructions (Signed)
Patient arrived for injection:: paliperidone (INVEGA SUSTENNA) 156 MG/ML injection. Tolerated injection well in  Right Arm. Very Pleasant. Shared her plans for getting ready for the Christmas Holiday.  NO SI/HI  NOR AH/VH.

## 2023-08-22 ENCOUNTER — Ambulatory Visit (HOSPITAL_COMMUNITY): Payer: Medicare HMO | Admitting: Psychiatry

## 2023-08-29 ENCOUNTER — Ambulatory Visit (HOSPITAL_COMMUNITY): Payer: Medicare HMO | Admitting: Psychiatry

## 2023-09-05 ENCOUNTER — Other Ambulatory Visit (HOSPITAL_COMMUNITY): Payer: Self-pay | Admitting: Psychiatry

## 2023-09-10 ENCOUNTER — Other Ambulatory Visit (HOSPITAL_COMMUNITY): Payer: Self-pay | Admitting: Psychiatry

## 2023-09-17 ENCOUNTER — Telehealth (HOSPITAL_COMMUNITY): Payer: Self-pay | Admitting: *Deleted

## 2023-09-17 NOTE — Telephone Encounter (Signed)
 Refill--  Gean Birchwood INVEGA SUSTENNA 156 MG/ML SUSY injection  Injection 10/20/23

## 2023-09-19 ENCOUNTER — Ambulatory Visit (INDEPENDENT_AMBULATORY_CARE_PROVIDER_SITE_OTHER): Payer: Medicare HMO | Admitting: Psychiatry

## 2023-09-19 ENCOUNTER — Encounter (HOSPITAL_COMMUNITY): Payer: Self-pay | Admitting: Psychiatry

## 2023-09-19 VITALS — BP 108/76 | HR 80 | Ht 64.5 in | Wt 190.0 lb

## 2023-09-19 DIAGNOSIS — F312 Bipolar disorder, current episode manic severe with psychotic features: Secondary | ICD-10-CM | POA: Diagnosis not present

## 2023-09-19 NOTE — Progress Notes (Addendum)
 Patient arrived for injection -- paliperidone (INVEGA SUSTENNA) injection 156 mg. Patient tolerated injection well in Left  Deltoid. Patient pleasant as always.  NO SI/HI  NOR AH/VH

## 2023-09-19 NOTE — Patient Instructions (Signed)
 Patient arrived for injection -- paliperidone (INVEGA SUSTENNA) injection 156 mg. Patient tolerated injection well in Left Arm. Patient pleasant as always.  NO SI/HI  NOR AH/VH

## 2023-09-24 ENCOUNTER — Other Ambulatory Visit (HOSPITAL_COMMUNITY): Payer: Self-pay | Admitting: Psychiatry

## 2023-10-01 ENCOUNTER — Ambulatory Visit (INDEPENDENT_AMBULATORY_CARE_PROVIDER_SITE_OTHER): Payer: Medicare HMO | Admitting: Psychiatry

## 2023-10-01 ENCOUNTER — Encounter (HOSPITAL_COMMUNITY): Payer: Self-pay | Admitting: Psychiatry

## 2023-10-01 VITALS — BP 139/84 | HR 86 | Ht 64.5 in | Wt 188.0 lb

## 2023-10-01 DIAGNOSIS — G259 Extrapyramidal and movement disorder, unspecified: Secondary | ICD-10-CM | POA: Diagnosis not present

## 2023-10-01 DIAGNOSIS — F411 Generalized anxiety disorder: Secondary | ICD-10-CM | POA: Diagnosis not present

## 2023-10-01 DIAGNOSIS — F310 Bipolar disorder, current episode hypomanic: Secondary | ICD-10-CM

## 2023-10-01 MED ORDER — INVEGA SUSTENNA 156 MG/ML IM SUSY: PREFILLED_SYRINGE | INTRAMUSCULAR | 2 refills | Status: DC

## 2023-10-01 NOTE — Progress Notes (Signed)
[facility] Outpatient Follow up visit  Patient Identification: Heather Marquez MRN:  [id number] Date of Evaluation:  10/01/2023 Referral Source: [facility] mental health Chief Complaint:   Chief Complaint   Follow-up    Fu Bipolar Visit Diagnosis:    ICD-10-CM   1. Bipolar disorder, current episode hypomanic (HCC)  F31.0     2. 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 Anxiety state  F41.1             History of Present Illness:  63   years old currently married African-American female initially referred by [facility] committee mental healthcare after her relocation here.  Doing fair handling stress, has a supportive husband Denies paranoia , nor AH Stable on meds,  Some tremors of right hand but says not worse and cogentin helps Regular injection with invega    Aggravating factor: non compliance history  Modifying factor: husband   Past Psychiatric History: bipolar, manic as per admission history  Previous Psychotropic Medications: Yes     Family History: History reviewed. No pertinent family history.  Social History:   Social History   Socioeconomic History   Marital status: Married    Spouse name: Not on file   Number of children: Not on file   Years of education: Not on file   Highest education level: Not on file  Occupational History   Not on file  Tobacco Use   Smoking status: Never   Smokeless tobacco: Never  Vaping Use   Vaping status: Never Used  Substance and Sexual Activity   Alcohol use: No   Drug use: No   Sexual activity: Yes    Partners: Male  Other Topics Concern   Not on file  Social History Narrative   Not on file   Social Drivers of Health   Financial Resource Strain: Low Risk  (10/22/2022)   Received from [facility], [facility]   Overall Financial Resource Strain (CARDIA)    Difficulty of Paying Living Expenses: Not hard at all  Food Insecurity: No Food Insecurity (10/22/2022)   Received from [facility], [facility]    Hunger Vital Sign    Worried About Running Out of Food in the Last Year: Never true    Ran Out of Food in the Last Year: Never true  Transportation Needs: No Transportation Needs (10/22/2022)   Received from [facility], [facility]   PRAPARE - Transportation    Lack of Transportation (Medical): No    Lack of Transportation (Non-Medical): No  Physical Activity: Sufficiently Active (10/22/2022)   Received from [facility], [facility]   Exercise Vital Sign    Days of Exercise per Week: 5 days    Minutes of Exercise per Session: 30 min  Stress: No Stress Concern Present (10/22/2022)   Received from [facility] - Occupational Stress Questionnaire    Feeling of Stress : Not at all  Social Connections: Socially Integrated (10/22/2022)   Received from [facility], [facility]   Social Network    How would you rate your social network (family, work, friends)?: Good participation with social networks      Allergies:   Allergies  Allergen Reactions   Mango Flavoring Agent (Non-Screening) Rash    Metabolic Disorder Labs: Lab Results  Component Value Date   HGBA1C 5.6 10/01/2019   MPG 114.02 10/01/2019   No results found for: "PROLACTIN" Lab Results  Component Value Date   CHOL 160  10/01/2019   TRIG 46 10/01/2019   HDL 71 10/01/2019   CHOLHDL 2.3 10/01/2019   VLDL 9 10/01/2019   LDLCALC 80 10/01/2019     Current Medications: Current Outpatient Medications  Medication Sig Dispense Refill   amLODipine (NORVASC) 10 MG tablet Take 1 tablet (10 mg total) by mouth daily. 30 tablet 1   benztropine (COGENTIN) 0.5 MG tablet TAKE 1 TABLET EVERY DAY 90 tablet 0   divalproex (DEPAKOTE) 500 MG DR tablet TAKE 1 TABLET AT BEDTIME 90 tablet 0   INVEGA SUSTENNA 156 MG/ML SUSY injection INJECT 1 ML INTRAMUSCULARLY  ONCE EVERY MONTH 1 mL 0   rosuvastatin (CRESTOR) 5 MG tablet Take 1 tablet (5 mg total) by mouth daily. 30  tablet 1   Current Facility-Administered Medications  Medication Dose Route Frequency Provider Last Rate Last Admin   paliperidone (INVEGA SUSTENNA) injection 156 mg  156 mg Intramuscular Q30 days Thresa Ross, MD   156 mg at 09/19/23 1054      Psychiatric Specialty Exam: Review of Systems  Cardiovascular:  Negative for chest pain.  Psychiatric/Behavioral:  Negative for depression, hallucinations and substance abuse.     Blood pressure 139/84, pulse 86, height 5' 4.5" (1.638 m), weight 188 lb (85.3 kg).Body mass index is 31.77 kg/m.  General Appearance: casual  Eye Contact: fair  Speech:  Low   Volume:  Decreased  Mood: fair  Affect:   Thought Process:  Goal Directed  Orientation:  Full (Time, Place, and Person)  Thought Content:  Rumination  Suicidal Thoughts:  No  Homicidal Thoughts:  No  Memory:  Immediate;   Fair Recent;   Fair  Judgement:  Fair  Insight:  Fair  Psychomotor Activity:    Concentration:  Concentration: Fair and Attention Span: Fair  Recall:  [employer] of Knowledge:Fair  Language: Fair  Akathisia:    Handed:  Right  AIMS (if indicated): no involuntary movements  Assets:  Desire for Improvement Social Support  ADL's:  Intact  Cognition: WNL  Sleep:  fair  Past documentation copy reviewed  Treatment Plan Summary: Medication management and Plan as follows    Prior documentation reviewed   1. Bipolar per history: manic ; stable on invega injection monthly and depakote at night, will continue  Last depakote reviewed levels, she does not want to have blood work for now Reviewed side effects, is on one tab at night only 2. Anxiety; manageable . Says not worse 3. Sleep: fair, reviewed sleep hygiene  4. EPS ; not worse, baseline and with cogentin , hellps   Fu 77m.  Direct care time spent 20 minutes  including lab request, face to face and documentation. Thresa Ross MD

## 2023-10-17 ENCOUNTER — Ambulatory Visit (INDEPENDENT_AMBULATORY_CARE_PROVIDER_SITE_OTHER): Payer: Medicare HMO | Admitting: Psychiatry

## 2023-10-17 ENCOUNTER — Encounter (HOSPITAL_COMMUNITY): Payer: Self-pay | Admitting: Psychiatry

## 2023-10-17 VITALS — BP 121/74 | HR 72 | Ht 64.5 in | Wt 189.0 lb

## 2023-10-17 DIAGNOSIS — F310 Bipolar disorder, current episode hypomanic: Secondary | ICD-10-CM | POA: Diagnosis not present

## 2023-10-17 NOTE — Patient Instructions (Signed)
 PATIENT ARRIVED FOR INJECTION:: paliperidone  (INVEGA  SUSTENNA) 156 MG/ML SUSY injection . TOLERATED INJECTION WELL IN RIGHT ARM. NO COMPLIANT OF SI/HI NOR AH/VH. PATIENT PLEASANT AS ALWAYS

## 2023-10-17 NOTE — Progress Notes (Signed)
 PATIENT ARRIVED FOR INJECTION:: paliperidone  (INVEGA  SUSTENNA) 156 MG/ML SUSY injection . TOLERATED INJECTION WELL IN RIGHT ARM. NO COMPLIANT OF SI/HI NOR AH/VH. PATIENT PLEASANT AS ALWAYS

## 2023-10-28 ENCOUNTER — Telehealth (HOSPITAL_COMMUNITY): Payer: Self-pay | Admitting: *Deleted

## 2023-10-28 NOTE — Telephone Encounter (Signed)
Checking Next Injection Appt

## 2023-11-14 ENCOUNTER — Ambulatory Visit (INDEPENDENT_AMBULATORY_CARE_PROVIDER_SITE_OTHER): Payer: Medicare HMO | Admitting: Psychiatry

## 2023-11-14 ENCOUNTER — Encounter (HOSPITAL_COMMUNITY): Payer: Self-pay | Admitting: Psychiatry

## 2023-11-14 VITALS — BP 129/81 | HR 83 | Ht 64.5 in | Wt 189.0 lb

## 2023-11-14 DIAGNOSIS — F312 Bipolar disorder, current episode manic severe with psychotic features: Secondary | ICD-10-CM

## 2023-11-14 NOTE — Progress Notes (Signed)
    Patient ID: Heather Marquez, female    DOB: [birth year], 63 y.o.   MRN: [id number]  No chief complaint on file.   Allergies Mango flavoring agent (non-screening)  Subjective:   Heather Marquez is a 63 y.o. female who presents to [facility] today.  HPI HPI  Past Medical History:  Diagnosis Date   Bipolar 1 disorder (HCC)    x 21 years   Bipolar disorder, in partial remission, most recent episode mixed (HCC) 04/20/2019   Last Assessment & Plan:  Formatting of this note might be different from the original.  Patient has required ER evaluation multiple times since last encounter.  Reports symptoms fairly well controlled on medication.  Keep follow-up appointments with psychiatrist.   Bipolar I disorder, most recent episode hypomanic (HCC) 09/30/2019   Hypertension    unknown    No past surgical history on file.  No family history on file.   Social History   Socioeconomic History   Marital status: Married    Spouse name: Not on file   Number of children: Not on file   Years of education: Not on file   Highest education level: Not on file  Occupational History   Not on file  Tobacco Use   Smoking status: Never   Smokeless tobacco: Never  Vaping Use   Vaping status: Never Used  Substance and Sexual Activity   Alcohol use: No   Drug use: No   Sexual activity: Yes    Partners: Male  Other Topics Concern   Not on file  Social History Narrative   Not on file   Social Drivers of Health   Financial Resource Strain: Low Risk  (10/22/2022)   Received from [facility], [facility]   Overall Financial Resource Strain (CARDIA)    Difficulty of Paying Living Expenses: Not hard at all  Food Insecurity: No Food Insecurity (10/22/2022)   Received from [facility], [facility]   Hunger Vital Sign    Worried About Running Out of Food in the Last Year: Never true    Ran Out of Food in the Last Year: Never true  Transportation Needs: No Transportation Needs  (10/22/2022)   Received from [facility], [facility]   PRAPARE - Transportation    Lack of Transportation (Medical): No    Lack of Transportation (Non-Medical): No  Physical Activity: Sufficiently Active (10/22/2022)   Received from [facility], [facility]   Exercise Vital Sign    Days of Exercise per Week: 5 days    Minutes of Exercise per Session: 30 min  Stress: No Stress Concern Present (10/22/2022)   Received from [facility], [facility] of Occupational Health - Occupational Stress Questionnaire    Feeling of Stress : Not at all  Social Connections: Socially Integrated (10/22/2022)   Received from [facility], [facility]   Social Network    How would you rate your social network (family, work, friends)?: Good participation with social networks    Review of Systems   Objective:   There were no vitals taken for this visit.  Physical Exam   Assessment and Plan   There are no diagnoses linked to this encounter.   No follow-ups on file. Luella Cook Ersa, [place] 11/14/2023

## 2023-11-14 NOTE — Patient Instructions (Signed)
 Patient arrived for Injection -paliperidone (INVEGA SUSTENNA) injection 156 mg  Tolerated Injection Well in Left Arm. No Complaints states she's doing fine.  NO SI/HI  NOR AH/VH

## 2023-11-14 NOTE — Progress Notes (Signed)
 Patient arrived for Injection -paliperidone (INVEGA SUSTENNA) injection 156 mg  Tolerated Injection Well in Left Arm. No Complaints states she's doing fine.  NO SI/HI  NOR AH/VH

## 2023-11-17 ENCOUNTER — Other Ambulatory Visit (HOSPITAL_COMMUNITY): Payer: Self-pay | Admitting: Psychiatry

## 2023-12-05 ENCOUNTER — Other Ambulatory Visit (HOSPITAL_COMMUNITY): Payer: Self-pay | Admitting: Psychiatry

## 2023-12-12 ENCOUNTER — Ambulatory Visit (INDEPENDENT_AMBULATORY_CARE_PROVIDER_SITE_OTHER)

## 2023-12-12 ENCOUNTER — Encounter (HOSPITAL_COMMUNITY): Payer: Self-pay | Admitting: Psychiatry

## 2023-12-12 VITALS — BP 126/70 | HR 79 | Temp 98.2°F | Ht 65.0 in | Wt 186.0 lb

## 2023-12-12 DIAGNOSIS — F310 Bipolar disorder, current episode hypomanic: Secondary | ICD-10-CM | POA: Diagnosis not present

## 2023-12-12 NOTE — Progress Notes (Addendum)
 Patient in today for Invega Sustenna 156 mg/ml IM monthly injection.  Patient in today when she could arrange to come and verified with provider  could obtain 2 days early.  Patient presented with flat affect, level and pleasant mood, denied any auditory or visual hallucinations, no suicidal or homicidal ideations, plan, means or intent to want to harm self or others at this time.  Patient reported no issues with current medication regimen and stated no problems with doing monthly Invega Sustenna 156 mg/ml IM injection.  Medication injection prepared as ordered and given to patient in her left deltoid area.  Patient tolerated due injection without complaint of pain or discomfort and agreed to return in one month for next due injection.  Patient to call if any problems or change in symptoms prior to next appointment.

## 2023-12-17 ENCOUNTER — Telehealth (HOSPITAL_COMMUNITY): Payer: Self-pay | Admitting: *Deleted

## 2023-12-17 NOTE — Telephone Encounter (Signed)
 CHECKING PATIENT DOCUMENTATION ON INJECTION

## 2024-01-02 ENCOUNTER — Other Ambulatory Visit (HOSPITAL_COMMUNITY): Payer: Self-pay | Admitting: Psychiatry

## 2024-01-09 ENCOUNTER — Ambulatory Visit (INDEPENDENT_AMBULATORY_CARE_PROVIDER_SITE_OTHER): Admitting: Psychiatry

## 2024-01-09 ENCOUNTER — Encounter (HOSPITAL_COMMUNITY): Payer: Self-pay | Admitting: Psychiatry

## 2024-01-09 VITALS — BP 116/73 | HR 80 | Ht 65.0 in | Wt 186.0 lb

## 2024-01-09 DIAGNOSIS — F310 Bipolar disorder, current episode hypomanic: Secondary | ICD-10-CM | POA: Diagnosis not present

## 2024-01-09 NOTE — Progress Notes (Signed)
 Patient arrived for monthly injection -paliperidone  (INVEGA  SUSTENNA) injection 156 mg  Tolerated injection well in  Right Arm. Pleasant as  Always. Becoming more Talkative to me.  NO SI/HI  NOR AH/VH

## 2024-01-09 NOTE — Patient Instructions (Signed)
 Patient arrived for monthly injection -paliperidone  (INVEGA  SUSTENNA) injection 156 mg  Tolerated injection well in  Right Arm. Pleasant as  Always. Becoming more Talkative to me.  NO SI/HI  NOR AH/VH

## 2024-01-09 NOTE — Progress Notes (Signed)
    Patient ID: Florence Tritto, female    DOB: [birth year], 63 y.o.   MRN: [id number]  No chief complaint on file.   Allergies Mango flavoring agent (non-screening)  Subjective:   Quintella Brozowski is a 63 y.o. female who presents to [facility] today.  HPI HPI  Past Medical History:  Diagnosis Date   Bipolar 1 disorder (HCC)    x 21 years   Bipolar disorder, in partial remission, most recent episode mixed (HCC) 04/20/2019   Last Assessment & Plan:  Formatting of this note might be different from the original.  Patient has required ER evaluation multiple times since last encounter.  Reports symptoms fairly well controlled on medication.  Keep follow-up appointments with psychiatrist.   Bipolar I disorder, most recent episode hypomanic (HCC) 09/30/2019   Hypertension    unknown    No past surgical history on file.  No family history on file.   Social History   Socioeconomic History   Marital status: Married    Spouse name: Not on file   Number of children: Not on file   Years of education: Not on file   Highest education level: Not on file  Occupational History   Not on file  Tobacco Use   Smoking status: Never   Smokeless tobacco: Never  Vaping Use   Vaping status: Never Used  Substance and Sexual Activity   Alcohol use: No   Drug use: No   Sexual activity: Yes    Partners: Male  Other Topics Concern   Not on file  Social History Narrative   Not on file   Social Drivers of Health   Financial Resource Strain: Low Risk  (12/25/2023)   Received from [facility]   Overall Financial Resource Strain (CARDIA)    Difficulty of Paying Living Expenses: Not hard at all  Food Insecurity: No Food Insecurity (12/25/2023)   Received from [facility]   Hunger Vital Sign    Worried About Running Out of Food in the Last Year: Never true    Ran Out of Food in the Last Year: Never true  Transportation Needs: No Transportation Needs (12/25/2023)   Received from  [facility] - Transportation    Lack of Transportation (Medical): No    Lack of Transportation (Non-Medical): No  Physical Activity: Sufficiently Active (12/25/2023)   Received from [facility]   Exercise Vital Sign    Days of Exercise per Week: 7 days    Minutes of Exercise per Session: 50 min  Stress: No Stress Concern Present (12/25/2023)   Received from [facility] of Occupational Health - Occupational Stress Questionnaire    Feeling of Stress : Not at all  Social Connections: Socially Integrated (12/25/2023)   Received from [facility]   Social Network    How would you rate your social network (family, work, friends)?: Good participation with social networks    Review of Systems   Objective:   There were no vitals taken for this visit.  Physical Exam   Assessment and Plan   There are no diagnoses linked to this encounter.   No follow-ups on file. Radene Buffalo Ersa, RMA 01/09/2024

## 2024-01-28 ENCOUNTER — Ambulatory Visit (HOSPITAL_COMMUNITY): Payer: Medicare HMO | Admitting: Psychiatry

## 2024-01-30 ENCOUNTER — Other Ambulatory Visit (HOSPITAL_COMMUNITY): Payer: Self-pay | Admitting: Psychiatry

## 2024-02-04 ENCOUNTER — Ambulatory Visit (HOSPITAL_COMMUNITY): Admitting: Psychiatry

## 2024-02-06 ENCOUNTER — Encounter (HOSPITAL_COMMUNITY): Payer: Self-pay | Admitting: Psychiatry

## 2024-02-06 ENCOUNTER — Ambulatory Visit (INDEPENDENT_AMBULATORY_CARE_PROVIDER_SITE_OTHER): Admitting: Psychiatry

## 2024-02-06 VITALS — BP 132/75 | HR 90 | Ht 65.0 in | Wt 184.0 lb

## 2024-02-06 DIAGNOSIS — G259 Extrapyramidal and movement disorder, unspecified: Secondary | ICD-10-CM

## 2024-02-06 DIAGNOSIS — F411 Generalized anxiety disorder: Secondary | ICD-10-CM | POA: Diagnosis not present

## 2024-02-06 DIAGNOSIS — F310 Bipolar disorder, current episode hypomanic: Secondary | ICD-10-CM

## 2024-02-06 MED ORDER — INVEGA SUSTENNA 156 MG/ML IM SUSY: PREFILLED_SYRINGE | INTRAMUSCULAR | 0 refills | Status: DC

## 2024-02-06 MED ORDER — PALIPERIDONE PALMITATE ER 156 MG/ML IM SUSY
156.0000 mg | PREFILLED_SYRINGE | Freq: Once | INTRAMUSCULAR | Status: AC
  Administered 2024-02-06: 156 mg via INTRAMUSCULAR

## 2024-02-06 NOTE — Progress Notes (Signed)
 Patient arrived for injection-- paliperidone  (INVEGA  SUSTENNA) 156 MG/ML   Tolerated well in Left Arm.  No Si/Hi  NOR AH/VH.  PATIENT SO CHARMING

## 2024-02-06 NOTE — Progress Notes (Signed)
 [facility] Outpatient Follow up visit  Patient Identification: Heather Marquez MRN:  [id number] Date of Evaluation:  02/06/2024 Referral Source: [facility] mental health Chief Complaint:   Chief Complaint   Follow-up    Fu Bipolar Visit Diagnosis:    ICD-10-CM   1. Bipolar disorder, most recent episode hypomanic (HCC)  F31.0     2. 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 Anxiety state  F41.1             History of Present Illness:  63   years old currently married African-American female initially referred by [facility] committee mental healthcare after her relocation here.  On eval doing baseline, denies paranoia and AH Supportive husband Labs reviewed LDL wnl Follows with PCP in regard to BP and cholesterol  Will send for depakote  level    Aggravating factor: non compliance history  Modifying factor: husband   Past Psychiatric History: bipolar, manic as per admission history  Previous Psychotropic Medications: Yes     Family History: No family history on file.  Social History:   Social History   Socioeconomic History   Marital status: Married    Spouse name: Not on file   Number of children: Not on file   Years of education: Not on file   Highest education level: Not on file  Occupational History   Not on file  Tobacco Use   Smoking status: Never   Smokeless tobacco: Never  Vaping Use   Vaping status: Never Used  Substance and Sexual Activity   Alcohol use: No   Drug use: No   Sexual activity: Yes    Partners: Male  Other Topics Concern   Not on file  Social History Narrative   Not on file   Social Drivers of Health   Financial Resource Strain: Low Risk  (12/25/2023)   Received from [facility]   Overall Financial Resource Strain (CARDIA)    Difficulty of Paying Living Expenses: Not hard at all  Food Insecurity: No Food Insecurity (12/25/2023)   Received from [facility]   Hunger Vital Sign    Worried About Running Out of Food in the Last  Year: Never true    Ran Out of Food in the Last Year: Never true  Transportation Needs: No Transportation Needs (12/25/2023)   Received from [facility] - Transportation    Lack of Transportation (Medical): No    Lack of Transportation (Non-Medical): No  Physical Activity: Sufficiently Active (12/25/2023)   Received from [facility]   Exercise Vital Sign    Days of Exercise per Week: 7 days    Minutes of Exercise per Session: 50 min  Stress: No Stress Concern Present (12/25/2023)   Received from [facility] of Occupational Health - Occupational Stress Questionnaire    Feeling of Stress : Not at all  Social Connections: Socially Integrated (12/25/2023)   Received from [facility]   Social Network    How would you rate your social network (family, work, friends)?: Good participation with social networks      Allergies:   Allergies  Allergen Reactions   Mango Flavoring Agent (Non-Screening) Rash    Metabolic Disorder Labs: Lab Results  Component Value Date   HGBA1C 5.6 10/01/2019   MPG 114.02 10/01/2019   No results found for: "PROLACTIN" Lab Results  Component Value Date   CHOL 160 10/01/2019   TRIG 46 10/01/2019   HDL 71 10/01/2019   CHOLHDL 2.3 10/01/2019  VLDL 9 10/01/2019   LDLCALC 80 10/01/2019     Current Medications: Current Outpatient Medications  Medication Sig Dispense Refill   amLODipine  (NORVASC ) 10 MG tablet Take 1 tablet (10 mg total) by mouth daily. 30 tablet 1   benztropine  (COGENTIN ) 0.5 MG tablet TAKE 1 TABLET EVERY DAY 90 tablet 0   divalproex  (DEPAKOTE ) 500 MG DR tablet TAKE 1 TABLET AT BEDTIME 90 tablet 0   INVEGA  SUSTENNA 156 MG/ML SUSY injection INJECT 1 ML INTRAMUSCULARLY  ONCE EVERY MONTH 1 mL 0   rosuvastatin  (CRESTOR ) 5 MG tablet Take 1 tablet (5 mg total) by mouth daily. 30 tablet 1   No current facility-administered medications for this visit.      Psychiatric Specialty Exam: Review of Systems   Cardiovascular:  Negative for chest pain.  Psychiatric/Behavioral:  Negative for depression, hallucinations and substance abuse.     Blood pressure 132/75, pulse 90, height 5\' 5"  (1.651 m), weight 184 lb (83.5 kg).Body mass index is 30.62 kg/m.  General Appearance: casual  Eye Contact: fair  Speech:  Low   Volume:  Decreased  Mood: fair  Affect:   Thought Process:  Goal Directed  Orientation:  Full (Time, Place, and Person)  Thought Content:  Rumination  Suicidal Thoughts:  No  Homicidal Thoughts:  No  Memory:  Immediate;   Fair Recent;   Fair  Judgement:  Fair  Insight:  Fair  Psychomotor Activity:    Concentration:  Concentration: Fair and Attention Span: Fair  Recall:  [employer] of Knowledge:Fair  Language: Fair  Akathisia:    Handed:  Right  AIMS (if indicated): no involuntary movements  Assets:  Desire for Improvement Social Support  ADL's:  Intact  Cognition: WNL  Sleep:  fair  Past documentation copy reviewed  Treatment Plan Summary: Medication management and Plan as follows   Prior documentation reviewed   1. Bipolar per history: manic ;stable on injection sustenna, some tremors of outstrecthed arms. Takes cogentin  for EPS Labs reviewed and will send for VPA level Getting injection sustenna today  2. Anxiety; no particular worries, husband is supportive, continue coping skills 3. Sleep: fair, reviewed sleep hygiene  4. EPS ; baseline, continue cogentin   Fu 26m.  Direct care time spent 20 minutes  including lab request, face to face and documentation. Wray Heady MD

## 2024-02-06 NOTE — Patient Instructions (Signed)
 Patient arrived for injection-- paliperidone  (INVEGA  SUSTENNA) 156 MG/ML   Tolerated well in Left Arm.  No Si/Hi  NOR AH/VH.  PATIENT SO CHARMING

## 2024-02-07 LAB — VALPROIC ACID LEVEL: Valproic Acid Lvl: 61 ug/mL (ref 50–100)

## 2024-02-18 ENCOUNTER — Other Ambulatory Visit (HOSPITAL_COMMUNITY): Payer: Self-pay | Admitting: Psychiatry

## 2024-02-28 ENCOUNTER — Ambulatory Visit (HOSPITAL_COMMUNITY)

## 2024-02-29 ENCOUNTER — Other Ambulatory Visit (HOSPITAL_COMMUNITY): Payer: Self-pay | Admitting: Psychiatry

## 2024-03-02 ENCOUNTER — Telehealth (HOSPITAL_COMMUNITY): Payer: Self-pay | Admitting: *Deleted

## 2024-03-02 ENCOUNTER — Ambulatory Visit (INDEPENDENT_AMBULATORY_CARE_PROVIDER_SITE_OTHER): Admitting: *Deleted

## 2024-03-02 VITALS — BP 136/76 | HR 74 | Ht 65.0 in | Wt 190.0 lb

## 2024-03-02 DIAGNOSIS — F310 Bipolar disorder, current episode hypomanic: Secondary | ICD-10-CM

## 2024-03-02 NOTE — Telephone Encounter (Signed)
 paliperidone  (INVEGA  SUSTENNA) 156 MG/ML  TOLERATED WELL IN LEFT ARM  DMc-RMA  EXP: 2026-MAY LOT: [id number]

## 2024-03-02 NOTE — Patient Instructions (Signed)
 I HAVE  CLOSED THIS CHAPTER MY LOVELY PATIENT  & HUSBAND ARE MOVING TO [employer] [place] TO RETIRE.  AND I AM VERY HAPPY  & EXCITED FOR THIS VERY NICE COUPLE.  THEY HAVE A NEW HOME & SHE HAS A PROVIDER THAT  DR. GERALENE HAS HELPED FACILITATE.  PATIENT HAS ACCEPTED HER INJECTION TODAY IN LEFT ARM WELL & WITH NO C/O OF SI /HI NOR AH/VH.

## 2024-03-02 NOTE — Progress Notes (Addendum)
 I HAVE  CLOSED THIS CHAPTER MY LOVELY PATIENT  & HUSBAND ARE MOVING TO [employer] [place] TO RETIRE.  AND I AM VERY HAPPY  & EXCITED FOR THIS VERY NICE COUPLE.  THEY HAVE A NEW HOME & SHE HAS A PROVIDER THAT  DR. GERALENE HAS HELPED FACILITATE.  PATIENT HAS ACCEPTED HER INJECTION TODAY IN LEFT ARM WELL & WITH NO C/O OF SI /HI NOR AH/VH.   Patient  was  given Injection Early Authorized by Provider Due to her & Husband Relocating to [facility]

## 2024-03-04 ENCOUNTER — Other Ambulatory Visit (HOSPITAL_COMMUNITY): Payer: Self-pay | Admitting: *Deleted

## 2024-03-04 ENCOUNTER — Telehealth (HOSPITAL_COMMUNITY): Payer: Self-pay | Admitting: *Deleted

## 2024-03-04 MED ORDER — INVEGA SUSTENNA 156 MG/ML IM SUSY: PREFILLED_SYRINGE | INTRAMUSCULAR | 6 refills | Status: AC

## 2024-03-04 NOTE — Telephone Encounter (Signed)
 Patient Called Stated that you & her spoke about you giving her refills on her Invega  & that the  [facility] there &  here would call & transfer the medication order  from facility to facility While getting Established  with new provider

## 2024-03-04 NOTE — Telephone Encounter (Signed)
 Provider Authorized Refill Sent [facility] Sign paliperidone  (INVEGA  SUSTENNA) 156 MG/ML SUSY injection

## 2024-03-05 ENCOUNTER — Ambulatory Visit (HOSPITAL_COMMUNITY): Admitting: Psychiatry

## 2024-03-23 ENCOUNTER — Other Ambulatory Visit (HOSPITAL_COMMUNITY): Payer: Self-pay | Admitting: Psychiatry

## 2024-04-12 ENCOUNTER — Other Ambulatory Visit (HOSPITAL_COMMUNITY): Payer: Self-pay | Admitting: Psychiatry

## 2024-05-03 ENCOUNTER — Other Ambulatory Visit (HOSPITAL_COMMUNITY): Payer: Self-pay | Admitting: Psychiatry

## 2024-06-11 ENCOUNTER — Ambulatory Visit (HOSPITAL_COMMUNITY): Admitting: Psychiatry

## 2024-10-12 ENCOUNTER — Encounter

## 2024-10-14 ENCOUNTER — Inpatient Hospital Stay: Admit: 2024-10-14 | Payer: Medicare (Managed Care)
